# Patient Record
Sex: Female | Born: 1985 | Race: White | Hispanic: No | Marital: Married | Smoking: Never smoker
Health system: Southern US, Community
[De-identification: ages and names within clinical notes are randomized; demographics above are authoritative.]

## PROBLEM LIST (undated history)

## (undated) ENCOUNTER — Inpatient Hospital Stay (HOSPITAL_COMMUNITY): Payer: Self-pay

## (undated) DIAGNOSIS — Z91018 Allergy to other foods: Secondary | ICD-10-CM

## (undated) DIAGNOSIS — Z973 Presence of spectacles and contact lenses: Secondary | ICD-10-CM

## (undated) DIAGNOSIS — T4145XA Adverse effect of unspecified anesthetic, initial encounter: Secondary | ICD-10-CM

## (undated) DIAGNOSIS — O139 Gestational [pregnancy-induced] hypertension without significant proteinuria, unspecified trimester: Secondary | ICD-10-CM

## (undated) DIAGNOSIS — O09299 Supervision of pregnancy with other poor reproductive or obstetric history, unspecified trimester: Secondary | ICD-10-CM

## (undated) DIAGNOSIS — Z6740 Type O blood, Rh positive: Secondary | ICD-10-CM

## (undated) DIAGNOSIS — F329 Major depressive disorder, single episode, unspecified: Secondary | ICD-10-CM

## (undated) DIAGNOSIS — E0500 Thyrotoxicosis with diffuse goiter without thyrotoxic crisis or storm: Secondary | ICD-10-CM

## (undated) DIAGNOSIS — E039 Hypothyroidism, unspecified: Secondary | ICD-10-CM

## (undated) DIAGNOSIS — Z8619 Personal history of other infectious and parasitic diseases: Secondary | ICD-10-CM

## (undated) DIAGNOSIS — K9041 Non-celiac gluten sensitivity: Secondary | ICD-10-CM

## (undated) DIAGNOSIS — F419 Anxiety disorder, unspecified: Secondary | ICD-10-CM

## (undated) DIAGNOSIS — F32A Depression, unspecified: Secondary | ICD-10-CM

## (undated) DIAGNOSIS — R000 Tachycardia, unspecified: Secondary | ICD-10-CM

## (undated) DIAGNOSIS — T8859XA Other complications of anesthesia, initial encounter: Secondary | ICD-10-CM

## (undated) HISTORY — DX: Anxiety disorder, unspecified: F41.9

## (undated) HISTORY — DX: Depression, unspecified: F32.A

## (undated) HISTORY — DX: Personal history of other infectious and parasitic diseases: Z86.19

## (undated) HISTORY — DX: Type O blood, Rh positive: Z67.40

---
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
## 2007-02-09 ENCOUNTER — Emergency Department (HOSPITAL_COMMUNITY): Admission: EM | Admit: 2007-02-09 | Discharge: 2007-02-09 | Payer: Self-pay | Admitting: Emergency Medicine

## 2011-01-19 ENCOUNTER — Inpatient Hospital Stay (HOSPITAL_COMMUNITY)
Admission: AD | Admit: 2011-01-19 | Discharge: 2011-01-19 | Disposition: A | Discharge status: Home / Self Care | Payer: 59 | Source: Ambulatory Visit | Attending: Obstetrics and Gynecology | Admitting: Obstetrics and Gynecology

## 2011-01-19 DIAGNOSIS — O200 Threatened abortion: Secondary | ICD-10-CM | POA: Insufficient documentation

## 2011-01-19 LAB — HCG, QUANTITATIVE, PREGNANCY: hCG, Beta Chain, Quant, S: 331 m[IU]/mL — ABNORMAL HIGH (ref <5)

## 2011-01-20 LAB — ABO/RH: ABO/RH(D): O POS

## 2011-02-05 NOTE — Consult Note (Signed)
  NAMEJENIAH, Donna Tucker               ACCOUNT NO.:  [id number]  MEDICAL RECORD NO.:  [id number]           PATIENT TYPE:  I  LOCATION:  [facility]                         FACILITY:  [facility]  PHYSICIAN:  Lenoard Aden, M.D.DATE OF BIRTH:  1986-11-20  DATE OF CONSULTATION:  01/19/2011 DATE OF DISCHARGE:  01/19/2011                                CONSULTATION   CHIEF COMPLAINT:  Bleeding.  HISTORY OF PRESENT ILLNESS:  She is a 25 year old white female G2, P0 with positive pregnancy test on January 18, 2011, who presents now with sudden onset of bleeding this evening, which has now ceased.  She denies fever, chills, nausea, vomiting.  She has allergies to ASPIRIN and NSAIDs.  Family history of diabetes, chronic hypertension, colon, liver, and breast cancer.  She has a previous history of one abortion, uncomplicated.  No surgical hospitalizations.  No medical hospitalizations.  MEDICATIONS:  Multivitamin and fish oil.  PHYSICAL EXAMINATION:  GENERAL:  She is a well-developed, well-nourished white female. VITAL SIGNS:  Weight of 195 pounds, height of 5 feet 4 inches, blood pressure is 140/80. HEENT:  Normal. NECK:  Supple.  Full range of motion. LUNGS:  Clear. HEART:  Regular rate and rhythm. ABDOMEN:  Soft, nontender.  No CVA tenderness. EXTREMITIES:  There are no cords. NEUROLOGIC:  Nonfocal. SKIN:  Intact. PELVIC:  Deferred with no bleeding at this time.  She has group and Rh, quantitative hCG pending.  IMPRESSION:  Threatened abortion at 5 weeks.  PLAN:  Check quantitative hCG, group and Rh.  Triage pending results.     Lenoard Aden, M.D.     RJT/MEDQ  D:  01/19/2011  T:  01/20/2011  Job:  [id number]  Electronically Signed by Olivia Mackie M.D. on 02/05/2011 11:07:10 AM

## 2011-06-09 ENCOUNTER — Other Ambulatory Visit (HOSPITAL_COMMUNITY): Payer: Self-pay | Admitting: Endocrinology

## 2011-06-09 DIAGNOSIS — E0590 Thyrotoxicosis, unspecified without thyrotoxic crisis or storm: Secondary | ICD-10-CM

## 2011-06-10 ENCOUNTER — Ambulatory Visit: Payer: 59 | Admitting: *Deleted

## 2011-06-29 ENCOUNTER — Encounter (HOSPITAL_COMMUNITY)
Admission: RE | Admit: 2011-06-29 | Discharge: 2011-06-29 | Disposition: A | Discharge status: Home / Self Care | Payer: 59 | Source: Ambulatory Visit | Attending: Endocrinology | Admitting: Endocrinology

## 2011-06-29 ENCOUNTER — Ambulatory Visit: Payer: 59 | Admitting: *Deleted

## 2011-06-29 ENCOUNTER — Encounter: Payer: Self-pay | Admitting: *Deleted

## 2011-06-29 ENCOUNTER — Encounter: Payer: 59 | Attending: Obstetrics and Gynecology | Admitting: *Deleted

## 2011-06-29 DIAGNOSIS — E0590 Thyrotoxicosis, unspecified without thyrotoxic crisis or storm: Secondary | ICD-10-CM | POA: Insufficient documentation

## 2011-06-29 DIAGNOSIS — Z713 Dietary counseling and surveillance: Secondary | ICD-10-CM | POA: Insufficient documentation

## 2011-06-29 DIAGNOSIS — E669 Obesity, unspecified: Secondary | ICD-10-CM | POA: Insufficient documentation

## 2011-06-29 NOTE — Patient Instructions (Addendum)
Goals:  1300-1400 calories per day with 45 grams of carbs at meals and 1-2 snacks of 15 grams each.   Add protein to all meals and snacks.    Choose more whole grains, lean protein, low-fat dairy, and fruits/non-starchy vegetables.  Aim for 20 min of moderate physical activity 3 days per week.  Limit sugar-sweetened beverages and concentrated sweets.  Aim for 25 grams of fiber daily.  Follow up with me in 4 weeks.

## 2011-06-29 NOTE — Progress Notes (Signed)
  Medical Nutrition Therapy:  Appt start time: 1500 end time:  1600.  Assessment:  Primary concerns today: Obesity. Pt here for nutritional counseling and weight loss to increase success of future pregnancies.  States her goal weight is 140-150 lbs in one year.  Also reports recent dx of hyperthyroidism, which is being treated with propranolol (started 06/28/11).  Pt states her diet varies with "good days and bad days", where bad days include excessive CHO intake.  Eats out ~6x week including 3x/wk at hospital cafeteria.  Increased CHO intake noted at home as well and excessive thirst, from which pt averages 100-125 oz of water daily. States glucose is WNL.  MEDICATIONS: Propranolol   DIETARY INTAKE:  Usual eating pattern includes 3 meals and 1-2 snacks per day.  24-hr recall:  B ( AM): 2 eggs, banana, water or OJ (8 oz)  Snk ( AM): none  L ( PM): Fruit, Chobani flavored yogurt, salad or PB&J OR [facility] cafeteria (2 veggies, 1 starchy veggie, salad w/ vinagrette or FF ranch - 2 oz) plus H2O or crystal light Snk ( PM): yogurt from lunch (if leftover) D ( PM): Curry and lg portion rice OR veggie chili w/ beans and tomatoes Snk ( PM): popcorn or frozen raspberries (sometimes)  Usual physical activity: Was gym 3x/week @ 1 hr until 2 mos ago - stopped d/t hyperthyroid issues  Estimated energy needs: 1300-1400 calories 160-175 g carbohydrates 80-85 g protein 35-40 g fat 25 g fiber  Progress Towards Goal(s):  NEW   Nutritional Diagnosis:  -3.3 Obesity related to excessive CHO intake as evidenced by patient-reported food intake pattern and a BMI of 34.6 kg/m2..    Intervention/Goals:  1300-1400 calories per day with 45 grams of carbs at meals and 1-2 snacks of 15 grams each.   Add protein to all meals and snacks.    Choose more whole grains, lean protein, low-fat dairy, and fruits/non-starchy vegetables.  Aim for 20 min of moderate physical activity 3 days per week.  Limit  sugar-sweetened beverages and concentrated sweets.  Aim for 25 grams of fiber daily.  Follow up with me in 4 weeks.  Monitoring/Evaluation:  Dietary intake, exercise, and body weight in 4 week(s).

## 2011-06-30 ENCOUNTER — Encounter (HOSPITAL_COMMUNITY)
Admission: RE | Admit: 2011-06-30 | Discharge: 2011-06-30 | Disposition: A | Discharge status: Home / Self Care | Payer: 59 | Source: Ambulatory Visit | Attending: Endocrinology | Admitting: Endocrinology

## 2011-06-30 ENCOUNTER — Encounter (HOSPITAL_COMMUNITY): Payer: Self-pay

## 2011-06-30 MED ORDER — SODIUM PERTECHNETATE TC 99M INJECTION
10.7000 | Freq: Once | INTRAVENOUS | Status: AC | PRN
  Administered 2011-06-30: 10.7 via INTRAVENOUS

## 2011-06-30 MED ORDER — SODIUM IODIDE I 131 CAPSULE: 9.7000 | Freq: Once | INTRAVENOUS | Status: AC | PRN

## 2011-07-05 ENCOUNTER — Other Ambulatory Visit (HOSPITAL_COMMUNITY): Payer: Self-pay | Admitting: Endocrinology

## 2011-07-05 DIAGNOSIS — E0590 Thyrotoxicosis, unspecified without thyrotoxic crisis or storm: Secondary | ICD-10-CM

## 2011-07-08 ENCOUNTER — Other Ambulatory Visit (HOSPITAL_COMMUNITY): Payer: 59

## 2011-07-08 ENCOUNTER — Ambulatory Visit (HOSPITAL_COMMUNITY)
Admission: RE | Admit: 2011-07-08 | Discharge: 2011-07-08 | Disposition: A | Discharge status: Home / Self Care | Payer: 59 | Source: Ambulatory Visit | Attending: Endocrinology | Admitting: Endocrinology

## 2011-07-08 DIAGNOSIS — E0590 Thyrotoxicosis, unspecified without thyrotoxic crisis or storm: Secondary | ICD-10-CM | POA: Insufficient documentation

## 2011-07-26 ENCOUNTER — Encounter: Payer: Self-pay | Admitting: *Deleted

## 2011-07-26 ENCOUNTER — Encounter: Payer: 59 | Attending: Obstetrics and Gynecology | Admitting: *Deleted

## 2011-07-26 DIAGNOSIS — Z713 Dietary counseling and surveillance: Secondary | ICD-10-CM | POA: Insufficient documentation

## 2011-07-26 DIAGNOSIS — E669 Obesity, unspecified: Secondary | ICD-10-CM | POA: Insufficient documentation

## 2011-07-26 NOTE — Patient Instructions (Addendum)
Goals:  Continue previous nutrition goals.  Look for whole grain cereals with increased fiber (25-30g/day) - Try Cheerios (1 cup) with Soy milk (1/2 cup).  Check out USDA's Nutrient Database for increased fiber foods.  Increase exercise to 30-45 minutes, 3-4 times a week.

## 2011-07-26 NOTE — Progress Notes (Signed)
  Medical Nutrition Therapy:  Appt start time: 0800 end time:  0830.  Primary concerns today: Obesity - Follow up.  Reported no thyroid cancer dx, though decreased appetite and PO intake since starting thyroid meds.  Reports being extremely thirsty and consumes ~200 oz daily.    MEDICATIONS: No changes.  DIETARY INTAKE:  Past week's avg: 3 meals and 1 snack/day.  24-hr recall: water intake = 200 oz/day  B: 2 eggs, 1-2 pc [place] bacon; water, coffee Snk: Chobani fruit yogurt; water  L: Squash (sauteed in [place]), green beans (canned by aunt), 1/2 corn on cobb, 1 pc cornbread (2" cube), 1 small red potato; water    Snk: None D: Peanut butter (2 T) sandwich on wheat (40g CHO), water    Snk: None  Recent physical activity: Walking treadmill 20 min ~2-3x/week - was hard to find time d/t school. More time for next month b/c class ended.   Estimated energy needs: (NO CHANGES) 1300-1400 calories  160-175 g carbohydrates  80-85 g protein  35-40 g fat  25 g fiber  Progress Towards Goal(s):  In progress.   Nutritional Diagnosis:  [place]-3.3 Obesity related to excessive CHO intake as evidenced by patient-reported food intake pattern and a BMI of 34.6 kg/m2.  Intervention/Goals:  Continue previous nutrition goals.  Look for whole grain cereals with increased fiber (25-30g/day) - Try Cheerios (1 cup) with Soy milk (1/2 cup).  Refer to USDA's Nutrient Database for increased fiber foods.  Increase exercise to 30-45 minutes, 3-4 times a week.  Monitoring/Evaluation:  Dietary intake, exercise, and body weight in 4 week(s).

## 2011-07-27 ENCOUNTER — Encounter: Payer: Self-pay | Admitting: *Deleted

## 2011-08-23 ENCOUNTER — Encounter: Payer: 59 | Attending: Obstetrics and Gynecology | Admitting: *Deleted

## 2011-08-23 ENCOUNTER — Encounter: Payer: Self-pay | Admitting: *Deleted

## 2011-08-23 DIAGNOSIS — Z713 Dietary counseling and surveillance: Secondary | ICD-10-CM | POA: Insufficient documentation

## 2011-08-23 DIAGNOSIS — E669 Obesity, unspecified: Secondary | ICD-10-CM | POA: Insufficient documentation

## 2011-08-23 NOTE — Patient Instructions (Addendum)
Goals:  Continue previous nutrition goals.  Make sure to add a snack with carbs and protein before working out.   Limit simple sugars and aim for increased fiber foods.  Continue exercise regimen 45 minutes, 3-4 times a week.  Call if continue to gain weight after the next 2-3 weeks.

## 2011-08-23 NOTE — Progress Notes (Addendum)
  Medical Nutrition Therapy:  Appt start time: 0830 end time:  0900.  Primary concerns today: Obesity - Follow up.  Reported no thyroid cancer dx, though decreased appetite and PO intake since starting thyroid meds.  Reports being extremely thirsty and consumes ~200 oz daily.  Pt frustrated with weight gain, though per MD, she may gain 5 more lbs before thyroid med "evens out".   MEDICATIONS: Lotemax  DIETARY INTAKE:  Past week's avg: 3 meals and 0-1 snack/day.  24-hr recall:  Fluid intake = ~200 oz/day B:  2 eggs, 1 c MG Cheerios w/ 1/4 c soy milk; water, coffee Snk: none  L:  Soup w/ cabbage, veg broth, chicken, carrots, beets, and potatoes (2 c) and flavored greek yogurt (4 oz); water, hot tea (plain)  Snk: carrots (if no gym) OR carrots w/ peanut butter or crackers/peanut butter (if gym) D: 1 slice homemade veggie pizza, salad w/ sun dried tomato vinagrette (dipped); water    Snk: 1 beer (not typical); tomato and mozzarella salad (few bites)  Recent physical activity:  Treadmill or elliptical 45 min ~2-3x/week. Spin class tonight; adding group classes with friend.  Estimated energy needs: (NO CHANGES) 1300-1400 calories  160-175 g carbohydrates  80-85 g protein  35-40 g fat  25 g fiber  Progress Towards Goal(s):  In progress.   Nutritional Diagnosis:  -3.3 Obesity related to excessive CHO intake as evidenced by patient-reported food intake pattern and a BMI of 34.6 kg/m2.  Intervention/Goals:  Continue previous nutrition goals.  Make sure to add a snack with carbs and protein before working out.   Limit simple sugars and aim for increased fiber foods.  Continue exercise regimen 45 minutes, 3-4 times a week.  Call if continue to gain weight after the next 2-3 weeks.   Monitoring/Evaluation:  Dietary intake, exercise, and body weight in 6 week(s).

## 2011-10-04 ENCOUNTER — Encounter: Payer: Self-pay | Admitting: *Deleted

## 2011-10-04 ENCOUNTER — Encounter: Payer: 59 | Attending: Obstetrics and Gynecology | Admitting: *Deleted

## 2011-10-04 DIAGNOSIS — Z713 Dietary counseling and surveillance: Secondary | ICD-10-CM | POA: Insufficient documentation

## 2011-10-04 DIAGNOSIS — E669 Obesity, unspecified: Secondary | ICD-10-CM | POA: Insufficient documentation

## 2011-10-04 NOTE — Patient Instructions (Addendum)
Goals:  Continue previous nutrition goals - return to previous ratios of carbs and protein. I will send you a revised yellow card.  Email me your food record.   Continue increased exercise.

## 2011-10-04 NOTE — Progress Notes (Signed)
  Medical Nutrition Therapy:  Appt start time: 0800 end time:  0830.  Primary concerns today: Obesity - Follow up.  Reports eating more CHO, less protein, and 100 more calories daily. Using [employer] app on phone; recent avg of 1300-1450 cal/d. Continues fluid intake of ~200 oz/day and has been eating out more d/t time management issues and increased stress. Has increased exercise intensity and frequency since last visit.   MEDICATIONS: No changes  DIETARY INTAKE:  Past week's avg: 3 meals and 0-1 snack/day.  24-hr recall:   B: oatmeal w/ apple or banana; coffee, water Snk: none  L:  Eating out in hospital cafe more - more high sodium foods  Snk: carrots (if no gym) OR carrots w/ peanut butter or crackers/peanut butter (if gym) D: Eating out 2x/wk - salad from fast food    Snk: not usually  Recent physical activity:  Treadmill or elliptical; hike - 45 min ~3-4x/week w/ increased intensity. Spin class 1x/wk  Estimated energy needs: (NO CHANGES) 1300-1400 calories  160-175 g carbohydrates  80-85 g protein  35-40 g fat  25 g fiber  Progress Towards Goal(s):  In progress.   Nutritional Diagnosis:  [place]-3.3 Obesity related to excessive CHO intake as evidenced by patient-reported food intake pattern and a BMI of 34.6 kg/m2.  Intervention/Goals:  Continue previous nutrition goals - return to previous ratios of carbs and protein. I will send you a revised yellow card.  Email me your food record.   Continue increased exercise.   Monitoring/Evaluation:  Dietary intake, exercise, and body weight in 4 week(s).

## 2011-11-02 ENCOUNTER — Ambulatory Visit: Payer: 59 | Admitting: *Deleted

## 2012-01-28 ENCOUNTER — Emergency Department
Admission: EM | Admit: 2012-01-28 | Discharge: 2012-01-28 | Disposition: A | Discharge status: Home / Self Care | Payer: 59 | Source: Home / Self Care | Attending: Family Medicine | Admitting: Family Medicine

## 2012-01-28 DIAGNOSIS — J069 Acute upper respiratory infection, unspecified: Secondary | ICD-10-CM

## 2012-01-28 MED ORDER — BENZONATATE 200 MG PO CAPS: 200.0000 mg | ORAL_CAPSULE | Freq: Every day | ORAL | Status: AC

## 2012-01-28 MED ORDER — AMOXICILLIN 875 MG PO TABS: 875.0000 mg | ORAL_TABLET | Freq: Two times a day (BID) | ORAL | Status: AC

## 2012-01-28 NOTE — ED Notes (Signed)
Recent travel to [place], came back with URI. Cough x4-5 nights and sore throat x 1 week.

## 2012-01-28 NOTE — ED Provider Notes (Signed)
History     CSN: [id number]  Arrival date & time 01/28/12  1713   First MD Initiated Contact with Patient 01/28/12 1747      Chief Complaint  Patient presents with  . Sore Throat     HPI Comments: Patient states that she was in [place] about one month ago and developed a mild URI with scratchy throat and sinus congestion that only lasted about a week. Since [place] complains of approximately 5 day history of gradually progressive URI symptoms beginning with a sore throat (now improved), followed by progressive nasal congestion.  A cough started about 3 days ago.  Complains of fatigue but no myalgias.  Cough is now worse at night and generally non-productive during the day.  There has been no pleuritic pain, shortness of breath, or wheezes.  She has had no fevers, chills, and sweats   The history is provided by the patient.    Past Medical History  Diagnosis Date  . Thyroid disease   . Obesity (BMI 30-39.9)     History reviewed. No pertinent past surgical history.  Family History  Problem Relation Age of Onset  . Diabetes Mother     T1DM    History  Substance Use Topics  . Smoking status: Never Smoker   . Smokeless tobacco: Never Used  . Alcohol Use: 0.6 oz/week    1 Cans of beer per week    OB History    Grav Para Term Preterm Abortions TAB SAB Ect Mult Living                  Review of Systems + sore throat, now improved + cough, worse at night No pleuritic pain No wheezing + nasal congestion ? post-nasal drainage No sinus pain/pressure No itchy/red eyes No earache, but ears feel clogged No hemoptysis No SOB No fever/chills No nausea No vomiting No abdominal pain No diarrhea No urinary symptoms No skin rashes + fatigue No myalgias + headache Used OTC meds without relief  Allergies  Nsaids; Salicylates; and Lactose intolerance (gi)  Home Medications   Current Outpatient Rx  Name Route Sig Dispense Refill  . CLOMIPHENE  CITRATE 50 MG PO TABS Oral Take 50 mg by mouth daily.    . CYCLOSPORINE 0.05 % OP EMUL  1 drop 2 (two) times daily.    . AMOXICILLIN 875 MG PO TABS Oral Take 1 tablet (875 mg total) by mouth 2 (two) times daily. (Rx void after 02/05/12) 20 tablet 0  . BENZONATATE 200 MG PO CAPS Oral Take 1 capsule (200 mg total) by mouth at bedtime. Take as needed for cough 12 capsule 0  . LOTEPREDNOL ETABONATE 0.5 % OP SUSP Both Eyes Place 1 drop into both eyes 2 (two) times daily.      Marland Kitchen PROPRANOLOL HCL 80 MG PO TABS Oral Take 80 mg by mouth daily.      Marland Kitchen PROPYLTHIOURACIL 50 MG PO TABS Oral Take by mouth at bedtime.        BP 124/88  Pulse 72  Temp(Src) 98.2 F (36.8 C) (Oral)  Resp 16  Ht 5\' 4"  (1.626 m)  Wt 223 lb 8 oz (101.379 kg)  BMI 38.36 kg/m2  SpO2 98%  LMP 01/17/2012  Physical Exam Nursing notes and Vital Signs reviewed. Appearance:  Patient appears healthy, stated age, and in no acute distress Eyes:  Pupils are equal, round, and reactive to light and accomodation.  Extraocular movement is intact.  Conjunctivae are not inflamed  Ears:  Canals normal.  Tympanic membranes normal.  Nose:  Mildly congested turbinates.  No sinus tenderness.   Pharynx:  Normal Neck:  Supple.  Slightly tender shotty posterior nodes are palpated bilaterally  Lungs:  Clear to auscultation.  Breath sounds are equal.  Heart:  Regular rate and rhythm without murmurs, rubs, or gallops.  Abdomen:  Nontender without masses or hepatosplenomegaly.  Bowel sounds are present.  No CVA or flank tenderness.  Skin:  No rash present.   ED Course  Procedures   none      1. Acute upper respiratory infections of unspecified site       MDM  There is no evidence of bacterial infection today.   Treat symptomatically for now: Take plain Mucinex (guaifenesin) twice daily for cough and congestion.  Increase fluid intake, rest. May use Afrin nasal spray (or generic oxymetazoline) twice daily for about 5 days.  Also recommend  using saline nasal spray several times daily and saline nasal irrigation (AYR is a common brand) Stop all antihistamines for now, and other non-prescription cough/cold preparations. Begin Amoxicillin if not improving about 5 days or if persistent fever develops (Given a prescription to hold, with an expiration date)  Follow-up with family doctor if not improving 7 to 10 days.         Donna Christen, MD 01/28/12 [phone]

## 2012-08-04 ENCOUNTER — Encounter: Payer: Self-pay | Admitting: Emergency Medicine

## 2012-08-04 ENCOUNTER — Emergency Department
Admission: EM | Admit: 2012-08-04 | Discharge: 2012-08-04 | Disposition: A | Discharge status: Home / Self Care | Payer: 59 | Source: Home / Self Care

## 2012-08-04 ENCOUNTER — Emergency Department (INDEPENDENT_AMBULATORY_CARE_PROVIDER_SITE_OTHER): Payer: 59

## 2012-08-04 DIAGNOSIS — M25579 Pain in unspecified ankle and joints of unspecified foot: Secondary | ICD-10-CM

## 2012-08-04 DIAGNOSIS — S93409A Sprain of unspecified ligament of unspecified ankle, initial encounter: Secondary | ICD-10-CM

## 2012-08-04 DIAGNOSIS — M7989 Other specified soft tissue disorders: Secondary | ICD-10-CM

## 2012-08-04 DIAGNOSIS — S93402A Sprain of unspecified ligament of left ankle, initial encounter: Secondary | ICD-10-CM

## 2012-08-04 MED ORDER — HYDROCODONE-ACETAMINOPHEN 5-500 MG PO TABS: ORAL_TABLET | ORAL | Status: AC

## 2012-08-04 NOTE — ED Provider Notes (Signed)
History     CSN: [id number]  Arrival date & time 08/04/12  1013   None     Chief Complaint  Patient presents with  . Ankle Injury    Patient is a 26 y.o. female presenting with lower extremity injury. The history is provided by the patient.  Ankle Injury This is a new problem. The current episode started 1 to 2 hours ago (Accidentally tripped at home and fell down some steps landing on left ankle.). The problem occurs constantly (Pain). The problem has been gradually worsening. Pertinent negatives include no chest pain, no abdominal pain, no headaches and no shortness of breath. Associated symptoms comments: No other complaints other than left ankle pain and swelling. The symptoms are aggravated by bending and walking. The symptoms are relieved by rest (Minimally). She has tried nothing for the symptoms.   The main area of severe pain and swelling is left lateral ankle. Also, there is moderate pain with mild swelling medial ankle.--Pain exacerbated by movement of ankle in all 4 motions: inversion or eversion, flexion or extension.  Denies numbness or any foot symptoms. Past Medical History  Diagnosis Date  . Thyroid disease   . Obesity (BMI 30-39.9)     History reviewed. No pertinent past surgical history.  Family History  Problem Relation Age of Onset  . Diabetes Mother     T1DM    History  Substance Use Topics  . Smoking status: Never Smoker   . Smokeless tobacco: Never Used  . Alcohol Use: 0.6 oz/week    1 Cans of beer per week    OB History    Grav Para Term Preterm Abortions TAB SAB Ect Mult Living                  Review of Systems  Constitutional: Negative for fever.  HENT: Negative for neck pain.   Eyes: Negative.   Respiratory: Negative.  Negative for shortness of breath.   Cardiovascular: Negative.  Negative for chest pain.  Gastrointestinal: Negative.  Negative for abdominal pain.  Genitourinary: Negative.   Musculoskeletal: Negative for back pain.    Skin: Negative for rash and wound.  Neurological: Negative for syncope, weakness, numbness and headaches.  Hematological: Negative.   All other systems reviewed and are negative.    Allergies  Nsaids; Salicylates; and Lactose intolerance (gi)  Home Medications   Current Outpatient Rx  Name Route Sig Dispense Refill  . CLOMIPHENE CITRATE 50 MG PO TABS Oral Take 50 mg by mouth daily.    . CYCLOSPORINE 0.05 % OP EMUL  1 drop 2 (two) times daily.    Marland Kitchen HYDROCODONE-ACETAMINOPHEN 5-500 MG PO TABS  Take 1 or 2 every 4-6 hours as needed for severe pain 6 tablet 0  . LOTEPREDNOL ETABONATE 0.5 % OP SUSP Both Eyes Place 1 drop into both eyes 2 (two) times daily.      Marland Kitchen PROPRANOLOL HCL 80 MG PO TABS Oral Take 80 mg by mouth daily.      Marland Kitchen PROPYLTHIOURACIL 50 MG PO TABS Oral Take by mouth at bedtime.        BP 128/83  Pulse 86  Temp 98 F (36.7 C) (Oral)  Resp 16  Ht 5\' 5"  (1.651 m)  Wt 227 lb (102.967 kg)  BMI 37.77 kg/m2  SpO2 97%  LMP 07/20/2012  Physical Exam  Nursing note and vitals reviewed. Constitutional: She is oriented to person, place, and time. She appears well-developed and well-nourished. No distress.  HENT:  Head: Normocephalic and atraumatic.  Eyes: Conjunctivae and EOM are normal. Pupils are equal, round, and reactive to light. No scleral icterus.  Neck: Normal range of motion.  Cardiovascular: Normal rate.   Pulmonary/Chest: Effort normal.  Abdominal: She exhibits no distension.  Neurological: She is alert and oriented to person, place, and time.  Skin: Skin is warm.  Psychiatric: She has a normal mood and affect.   musculoskeletal: Left ankle: + Ankle Tenderness entire joint, Distal fibula 3+ tender and swelling, with mild ecchymosis. Medial malleolus +1 tender with minimal swelling,  Deltoid ligament NT , Lateral ligaments tender (especially anterior talofibular ligament), Achilles NT, Proximal fibula NT, Proximal 5th metatarsal NT, Midfoot NT, distal NVI with  baseline sensation / motor.  CR<2 seconds.  There are no open skin wounds .  Range of motion limited in all directions because of pain. No ligamentous instability. Negative anterior drawer sign. ----- No other musculoskeletal abnormalities noted. No signs of any other injury. Negative Homans sign. No calf tenderness.   ED Course  Procedures (including critical care time) 12:29 PM-x-ray left ankle ordered  Dg Ankle Complete Left  08/04/2012  *RADIOLOGY REPORT*  Clinical Data: Left ankle pain after injury.  LEFT ANKLE COMPLETE - 3+ VIEW  Comparison: None.  Findings: There is no evidence for fracture, subluxation or dislocation.  No worrisome lytic or sclerotic osseous lesion.  IMPRESSION: Normal ankle.   Original Report Authenticated By: ERIC A. MANSELL, M.D.      1. Sprain of ankle, left       MDM  Likely has grade 2 ankle sprain, especially left lateral ankle, anterior talofibular ligament. But also involving medial ligaments. X-ray left ankle negative. We discussed diagnosis and findings and treatment options. After risks, benefits, alternatives, she agrees with the following plans: Encourage rest, ice, compression with ACE bandage, and elevation of injured body part. ASO ankle brace applied left ankle. Avoid full weightbearing left ankle, at least until recheck by physician in one week. Crutches supplied to patient, and our nurse spent a great deal of time assisting patient with learning how to walk with crutches. Advised to make appointment for recheck with Dr. Benjamin Stain (sports medicine specialist) in one week, sooner when necessary.  See detailed Instructions in AVS, which were given to patient. Verbal instructions also given. Risks, benefits, and alternatives of treatment options discussed. Questions invited and answered. Patient voiced understanding and agreement with plans.          Lajean Manes, MD 08/04/12 1235

## 2012-08-04 NOTE — ED Notes (Signed)
Left ankle injury, fell off step this morning

## 2012-08-05 ENCOUNTER — Telehealth: Payer: Self-pay | Admitting: Family Medicine

## 2012-08-11 ENCOUNTER — Encounter: Payer: Self-pay | Admitting: Sports Medicine

## 2012-08-11 ENCOUNTER — Ambulatory Visit (INDEPENDENT_AMBULATORY_CARE_PROVIDER_SITE_OTHER): Payer: 59 | Admitting: Sports Medicine

## 2012-08-11 VITALS — BP 118/79 | HR 78 | Ht 65.0 in | Wt 228.0 lb

## 2012-08-11 DIAGNOSIS — S93409A Sprain of unspecified ligament of unspecified ankle, initial encounter: Secondary | ICD-10-CM

## 2012-08-11 NOTE — Progress Notes (Signed)
Patient ID: Donna Tucker, female   DOB: Dec 26, 1985, 26 y.o.   MRN: [id number] Subjective:    I'm seeing this patient as a consultation for:  Dr. Georgina Pillion  CC: Left ankle sprain  HPI: This is a very pleasant 26 year old female, who unfortunately had an inversion injury to her ankle approximately one week ago. She had immediate pain, immediate swelling, and immediate bruising localized over the lateral aspect. She went to urgent care, had x-rays that were done that were negative for fracture, and was placed in an ASO. She was given Tylenol for pain, as she is allergic to NSAIDs.  Overall she's approximately 50% better. She is able to bear weight now. Her pain is localized over the anterolateral ankle, over the ATFL.  Past medical history, Surgical history, Family history, Social history, Allergies, and medications have been entered into the medical record, reviewed, and no changes needed.   Review of Systems: No headache, visual changes, nausea, vomiting, diarrhea, constipation, dizziness, abdominal pain, skin rash, fevers, chills, night sweats, weight loss, body aches, joint swelling, muscle aches, chest pain, or shortness of breath.   Objective:   Vitals:  Afebrile, vital signs stable. General: Well Developed, well nourished, and in no acute distress.  Neuro: Alert and oriented x3, extra-ocular muscles intact.  Skin: Warm and dry, no rashes noted.  Respiratory: Not using accessory muscles, speaking in full sentences.  Cardiovascular: Pulses palpable, no extremity edema. Left  Ankle: There is still moderate swelling, as well as a small amount of bruising, dependent to her lateral malleolus. Range of motion is full in all directions. Strength is 4/5 to resisted eversion. Stable lateral and medial ligaments; squeeze test and kleiger test unremarkable; Mildly tender over the lateral talar dome, however I suspect this is just tenderness over the overlying anterior talofibular ligament. No pain  at base of 5th MT; No tenderness over cuboid; No tenderness over N spot or navicular prominence No tenderness on posterior aspects of lateral and medial malleolus No sign of peroneal tendon subluxations or tenderness to palpation Negative tarsal tunnel tinel's Able to walk 4 steps.  I did review her x-rays, they show no sign of fracture, dislocation, the ankle mortise is intact. The talar dome is unremarkable.  Impression and Recommendations:

## 2012-08-11 NOTE — Assessment & Plan Note (Signed)
I agree with the previous diagnosis of a grade 2 anterior talofibular ligament sprain. I placed Ace wrap for compression. She should continue the ASO. I've given her some ankle rehabilitation exercises. I would like to see her back in 2 weeks. If no better we can consider formal physical therapy before MRI.

## 2012-08-24 ENCOUNTER — Other Ambulatory Visit (HOSPITAL_COMMUNITY): Payer: Self-pay | Admitting: Obstetrics and Gynecology

## 2012-08-24 DIAGNOSIS — N979 Female infertility, unspecified: Secondary | ICD-10-CM

## 2012-08-25 ENCOUNTER — Encounter: Payer: Self-pay | Admitting: Sports Medicine

## 2012-08-25 ENCOUNTER — Ambulatory Visit (INDEPENDENT_AMBULATORY_CARE_PROVIDER_SITE_OTHER): Payer: 59 | Admitting: Sports Medicine

## 2012-08-25 VITALS — BP 128/84 | HR 90 | Temp 97.7°F | Wt 228.0 lb

## 2012-08-25 DIAGNOSIS — S93409A Sprain of unspecified ligament of unspecified ankle, initial encounter: Secondary | ICD-10-CM

## 2012-08-25 MED ORDER — EPINEPHRINE 0.3 MG/0.3ML IJ DEVI: 0.3000 mg | Freq: Once | INTRAMUSCULAR | Status: DC

## 2012-08-25 NOTE — Assessment & Plan Note (Signed)
Over 90% improved. Continue home exercises. She will wear her ankle stabilizing orthosis when on her feet. She may come back to see me if not completely better in 2 weeks, otherwise she is released.

## 2012-08-25 NOTE — Progress Notes (Signed)
Subjective:    CC: Followup ankle sprain  HPI: She returns approximately 80-90% better. She is very happy with the results.  Past medical history, Surgical history, Family history, Social history, Allergies, and medications have been entered into the medical record, reviewed, and no changes needed.   Review of Systems: No fevers, chills, night sweats, weight loss, chest pain, or shortness of breath.   Objective:    General: Well Developed, well nourished, and in no acute distress.  Neuro: Alert and oriented x3, extra-ocular muscles intact.  HEENT: Normocephalic, atraumatic, pupils equal round reactive to light, neck supple, no masses, no lymphadenopathy, thyroid nonpalpable.  Skin: Warm and dry, no rashes. Cardiac: Regular rate and rhythm, no murmurs rubs or gallops.  Respiratory: Clear to auscultation bilaterally. Not using accessory muscles, speaking in full sentences. Ankle: No visible erythema or swelling. Range of motion is full in all directions. Strength is 5/5 in all directions. Stable lateral and medial ligaments; squeeze test and kleiger test unremarkable; Talar dome nontender; No pain at base of 5th MT; No tenderness over cuboid; No tenderness over N spot or navicular prominence No tenderness on posterior aspects of lateral and medial malleolus No sign of peroneal tendon subluxations or tenderness to palpation Negative tarsal tunnel tinel's Able to walk 4 steps.  Impression and Recommendations:

## 2012-08-29 ENCOUNTER — Ambulatory Visit (HOSPITAL_COMMUNITY)
Admission: RE | Admit: 2012-08-29 | Discharge: 2012-08-29 | Disposition: A | Discharge status: Home / Self Care | Payer: 59 | Source: Ambulatory Visit | Attending: Obstetrics and Gynecology | Admitting: Obstetrics and Gynecology

## 2012-08-29 DIAGNOSIS — N979 Female infertility, unspecified: Secondary | ICD-10-CM

## 2012-08-29 MED ORDER — IOHEXOL 300 MG/ML  SOLN
5.0000 mL | Freq: Once | INTRAMUSCULAR | Status: AC | PRN
  Administered 2012-08-29: 5 mL

## 2013-01-23 ENCOUNTER — Ambulatory Visit (INDEPENDENT_AMBULATORY_CARE_PROVIDER_SITE_OTHER): Payer: 59 | Admitting: Sports Medicine

## 2013-01-23 ENCOUNTER — Encounter: Payer: Self-pay | Admitting: Sports Medicine

## 2013-01-23 VITALS — BP 142/96 | HR 87 | Temp 98.2°F | Wt 215.0 lb

## 2013-01-23 DIAGNOSIS — J0100 Acute maxillary sinusitis, unspecified: Secondary | ICD-10-CM | POA: Insufficient documentation

## 2013-01-23 DIAGNOSIS — J020 Streptococcal pharyngitis: Secondary | ICD-10-CM

## 2013-01-23 DIAGNOSIS — J069 Acute upper respiratory infection, unspecified: Secondary | ICD-10-CM

## 2013-01-23 LAB — POCT RAPID STREP A (OFFICE): Rapid Strep A Screen: NEGATIVE

## 2013-01-23 MED ORDER — HYDROCOD POLST-CHLORPHEN POLST 10-8 MG/5ML PO LQCR: 5.0000 mL | Freq: Two times a day (BID) | ORAL | Status: DC | PRN

## 2013-01-23 NOTE — Assessment & Plan Note (Signed)
Likely viral with a negative rapid strep test. Tussionex for nighttime symptoms. Return as needed, I did inform her that the symptoms can hang around for approximately 4 weeks.

## 2013-01-23 NOTE — Patient Instructions (Addendum)

## 2013-01-23 NOTE — Progress Notes (Signed)
Subjective:    CC: Sick  HPI: This is a very pleasant 60 her old female who comes in with a two-week history of mild scratchy throat, mild cough but is currently resolving, no constitutional symptoms, no rash, no GI symptoms. Symptoms are overall improving, but the itchy scratchy throat is more or less hanging around. She denies any sinus pain or pressure, denies any nasal discharge.  Past medical history, Surgical history, Family history not pertinant except as noted below, Social history, Allergies, and medications have been entered into the medical record, reviewed, and no changes needed.   Review of Systems: No fevers, chills, night sweats, weight loss, chest pain, or shortness of breath.   Objective:    General: Well Developed, well nourished, and in no acute distress.  Neuro: Alert and oriented x3, extra-ocular muscles intact, sensation grossly intact.  HEENT: Normocephalic, atraumatic, pupils equal round reactive to light, neck supple, no masses, no lymphadenopathy, thyroid nonpalpable. Tonsillitis is present without exudates, nasopharynx and external ear canals are unremarkable to inspection. Skin: Warm and dry, no rashes. Cardiac: Regular rate and rhythm, no murmurs rubs or gallops.  Respiratory: Clear to auscultation bilaterally. Not using accessory muscles, speaking in full sentences.  Impression and Recommendations:

## 2013-01-27 ENCOUNTER — Other Ambulatory Visit: Payer: Self-pay

## 2013-02-02 ENCOUNTER — Ambulatory Visit (INDEPENDENT_AMBULATORY_CARE_PROVIDER_SITE_OTHER): Payer: 59 | Admitting: Sports Medicine

## 2013-02-02 ENCOUNTER — Telehealth: Payer: Self-pay

## 2013-02-02 ENCOUNTER — Encounter: Payer: Self-pay | Admitting: Sports Medicine

## 2013-02-02 VITALS — BP 142/95 | HR 98 | Temp 97.9°F | Wt 209.0 lb

## 2013-02-02 DIAGNOSIS — J0100 Acute maxillary sinusitis, unspecified: Secondary | ICD-10-CM

## 2013-02-02 MED ORDER — AMOXICILLIN-POT CLAVULANATE 875-125 MG PO TABS: 1.0000 | ORAL_TABLET | Freq: Two times a day (BID) | ORAL | Status: DC

## 2013-02-02 MED ORDER — PREDNISONE 50 MG PO TABS: 50.0000 mg | ORAL_TABLET | Freq: Every day | ORAL | Status: DC

## 2013-02-02 MED ORDER — FLUTICASONE PROPIONATE 50 MCG/ACT NA SUSP: NASAL | Status: DC

## 2013-02-02 NOTE — Progress Notes (Signed)
Subjective:    CC: Sick  HPI: Donna Tucker comes in with a couple weeks history of pain and pressure over her maxillary sinuses radiating to her ears. She also has a mild sore throat. Symptoms are moderate, and persistent. She tried some over-the-counter nasal sprays without any improvement. Denies cough, wheeze, shortness of breath, fevers, chills, GI symptoms, or rash.  Past medical history, Surgical history, Family history not pertinant except as noted below, Social history, Allergies, and medications have been entered into the medical record, reviewed, and no changes needed.   Review of Systems: No fevers, chills, night sweats, weight loss, chest pain, or shortness of breath.   Objective:    General: Well Developed, well nourished, and in no acute distress.  Neuro: Alert and oriented x3, extra-ocular muscles intact, sensation grossly intact.  HEENT: Normocephalic, atraumatic, pupils equal round reactive to light, neck supple, no masses, no lymphadenopathy, thyroid nonpalpable. Tender to palpation over the maxillary sinuses, nasopharynx and extremity of canals are unremarkable, there is tonsillar hypertrophy without exudate. Skin: Warm and dry, no rashes. Cardiac: Regular rate and rhythm, no murmurs rubs or gallops.  Respiratory: Clear to auscultation bilaterally. Not using accessory muscles, speaking in full sentences. Impression and Recommendations:

## 2013-02-02 NOTE — Assessment & Plan Note (Signed)
With eustachian tube dysfunction.  Prednisone for 5 days, Augmentin, Flonase. Return as needed.

## 2013-02-02 NOTE — Telephone Encounter (Signed)
Scheduled patient to come in for an acute visit.

## 2013-07-18 ENCOUNTER — Encounter: Payer: Self-pay | Admitting: Sports Medicine

## 2013-08-14 ENCOUNTER — Ambulatory Visit (INDEPENDENT_AMBULATORY_CARE_PROVIDER_SITE_OTHER): Payer: 59 | Admitting: Sports Medicine

## 2013-08-14 ENCOUNTER — Encounter: Payer: Self-pay | Admitting: Sports Medicine

## 2013-08-14 VITALS — BP 130/87 | HR 79 | Wt 191.0 lb

## 2013-08-14 DIAGNOSIS — E0500 Thyrotoxicosis with diffuse goiter without thyrotoxic crisis or storm: Secondary | ICD-10-CM

## 2013-08-14 DIAGNOSIS — Z299 Encounter for prophylactic measures, unspecified: Secondary | ICD-10-CM | POA: Insufficient documentation

## 2013-08-14 DIAGNOSIS — E669 Obesity, unspecified: Secondary | ICD-10-CM

## 2013-08-14 DIAGNOSIS — Z0000 Encounter for general adult medical examination without abnormal findings: Secondary | ICD-10-CM

## 2013-08-14 HISTORY — DX: Obesity, unspecified: E66.9

## 2013-08-14 MED ORDER — METHIMAZOLE 10 MG PO TABS: 10.0000 mg | ORAL_TABLET | Freq: Every day | ORAL | Status: DC

## 2013-08-14 MED ORDER — PHENTERMINE HCL 37.5 MG PO TABS: 37.5000 mg | ORAL_TABLET | Freq: Every day | ORAL | Status: DC

## 2013-08-14 NOTE — Assessment & Plan Note (Signed)
Phentermine, she is already following a good diet, already doing an exercise prescription. Return monthly for weight checks and refills.

## 2013-08-14 NOTE — Assessment & Plan Note (Signed)
Sees Dr. Juleen China. Checking TSH, T3, T4. No symptoms of hyperthyroidism. Refilling methimazole, she will keep a close followup with her endocrinologist.

## 2013-08-14 NOTE — Assessment & Plan Note (Signed)
Complete physical performed. Checking routine blood work.

## 2013-08-14 NOTE — Progress Notes (Signed)
  Subjective:    CC: Complete physical  HPI:  Physical exam: Donna Tucker is up-to-date on Pap smear, she will be getting a flu shot later in the year.  Graves' disease: Has an endocrinologist who sees her occasionally, she is on methimazole on a daily basis. She needs blood work checked. No palpitations, asymptomatic.  Obesity: Present for years, she has lost 30-40 pounds, she desires pharmacologic intervention. She already eats a low carbohydrate diet, low calorie. She also exercises between 30 and 40 minutes 4 times a week and gets her heart rate up to 80-85% of the maximum. She does desire to try pharmacologic intervention.  Past medical history, Surgical history, Family history not pertinant except as noted below, Social history, Allergies, and medications have been entered into the medical record, reviewed, and no changes needed.   Review of Systems: No headache, visual changes, nausea, vomiting, diarrhea, constipation, dizziness, abdominal pain, skin rash, fevers, chills, night sweats, swollen lymph nodes, weight loss, chest pain, body aches, joint swelling, muscle aches, shortness of breath, mood changes, visual or auditory hallucinations.  Objective:    General: Well Developed, well nourished, and in no acute distress.  Neuro: Alert and oriented x3, extra-ocular muscles intact, sensation grossly intact.  HEENT: Normocephalic, atraumatic, pupils equal round reactive to light, neck supple, no masses, no lymphadenopathy, thyroid nonpalpable.  Skin: Warm and dry, no rashes noted.  Cardiac: Regular rate and rhythm, no murmurs rubs or gallops.  Respiratory: Clear to auscultation bilaterally. Not using accessory muscles, speaking in full sentences.  Abdominal: Soft, nontender, nondistended, positive bowel sounds, no masses, no organomegaly.  Musculoskeletal: Shoulder, elbow, wrist, hip, knee, ankle stable, and with full range of motion.  Impression and Recommendations:    The patient was  counselled, risk factors were discussed, anticipatory guidance given.

## 2013-08-16 LAB — CHLORIDE
Alkaline Phosphatase: 75 U/L
Calcium: 9.6 mg/dL
Free Thyroxine Index: 2.3
Protein: 7.1
Thyroxine (T4): 9

## 2013-08-16 LAB — BASIC METABOLIC PANEL
BUN: 14 mg/dL (ref 4–21)
Creatinine: 0.7 mg/dL (ref 0.5–1.1)
Glucose: 73 mg/dL
Potassium: 4.2 mmol/L (ref 3.4–5.3)
Sodium: 140 mmol/L (ref 137–147)

## 2013-08-16 LAB — CBC AND DIFFERENTIAL: WBC: 9 10^3/mL

## 2013-09-11 ENCOUNTER — Ambulatory Visit (INDEPENDENT_AMBULATORY_CARE_PROVIDER_SITE_OTHER): Payer: 59 | Admitting: Sports Medicine

## 2013-09-11 ENCOUNTER — Encounter: Payer: Self-pay | Admitting: Sports Medicine

## 2013-09-11 VITALS — BP 134/91 | HR 97 | Wt 177.0 lb

## 2013-09-11 DIAGNOSIS — E669 Obesity, unspecified: Secondary | ICD-10-CM

## 2013-09-11 DIAGNOSIS — Z299 Encounter for prophylactic measures, unspecified: Secondary | ICD-10-CM

## 2013-09-11 NOTE — Assessment & Plan Note (Signed)
Discussed options. Sprintec as above.

## 2013-09-11 NOTE — Assessment & Plan Note (Signed)
14 pound weight loss since the last visit. Refilling phentermine. Return in one week for weight check and refills.

## 2013-09-11 NOTE — Progress Notes (Signed)
  Subjective:    CC: Weight check  HPI: Obesity: 191 pounds at the last visit, 177 today after starting phentermine. Very happy with results.  Hyperthyroidism: Has close followup with her endocrinologist, continues methimazole.  Family planning: Desires to start trying once she is lost some weight, she's already tried rhythm method, clomiphene, artificial insemination, nothing has worked. She does desire birth control until approximately 6 months from now.  Past medical history, Surgical history, Family history not pertinant except as noted below, Social history, Allergies, and medications have been entered into the medical record, reviewed, and no changes needed.   Review of Systems: No fevers, chills, night sweats, weight loss, chest pain, or shortness of breath.   Objective:    General: Well Developed, well nourished, and in no acute distress.  Neuro: Alert and oriented x3, extra-ocular muscles intact, sensation grossly intact.  HEENT: Normocephalic, atraumatic, pupils equal round reactive to light, neck supple, no masses, no lymphadenopathy, thyroid nonpalpable.  Skin: Warm and dry, no rashes. Cardiac: Regular rate and rhythm, no murmurs rubs or gallops, no lower extremity edema.  Respiratory: Clear to auscultation bilaterally. Not using accessory muscles, speaking in full sentences.  Impression and Recommendations:

## 2013-09-13 ENCOUNTER — Encounter: Payer: Self-pay | Admitting: *Deleted

## 2013-10-18 ENCOUNTER — Encounter: Payer: Self-pay | Admitting: Sports Medicine

## 2013-10-18 ENCOUNTER — Ambulatory Visit (INDEPENDENT_AMBULATORY_CARE_PROVIDER_SITE_OTHER): Payer: 59 | Admitting: Sports Medicine

## 2013-10-18 VITALS — BP 135/96 | HR 106 | Wt 166.0 lb

## 2013-10-18 DIAGNOSIS — E669 Obesity, unspecified: Secondary | ICD-10-CM

## 2013-10-18 MED ORDER — PHENTERMINE HCL 37.5 MG PO TABS: 37.5000 mg | ORAL_TABLET | Freq: Every day | ORAL | Status: DC

## 2013-10-18 NOTE — Assessment & Plan Note (Signed)
Additional 11 pound weight loss since last month with phentermine. Refilling. Return in one month.

## 2013-10-18 NOTE — Progress Notes (Signed)
  Subjective:    CC: Followup  HPI: Obesity: Additional 11 pounds weight loss in the last month, no adverse effects, she does note abnormal food aversions, but is still happy with her weight loss. She continues to exercise.  Past medical history, Surgical history, Family history not pertinant except as noted below, Social history, Allergies, and medications have been entered into the medical record, reviewed, and no changes needed.   Review of Systems: No fevers, chills, night sweats, weight loss, chest pain, or shortness of breath.   Objective:    General: Well Developed, well nourished, and in no acute distress.  Neuro: Alert and oriented x3, extra-ocular muscles intact, sensation grossly intact.  HEENT: Normocephalic, atraumatic, pupils equal round reactive to light, neck supple, no masses, no lymphadenopathy, thyroid nonpalpable.  Skin: Warm and dry, no rashes. Cardiac: Regular rate and rhythm, no murmurs rubs or gallops, no lower extremity edema.  Respiratory: Clear to auscultation bilaterally. Not using accessory muscles, speaking in full sentences.  Impression and Recommendations:

## 2013-11-15 ENCOUNTER — Ambulatory Visit (INDEPENDENT_AMBULATORY_CARE_PROVIDER_SITE_OTHER): Payer: 59 | Admitting: Sports Medicine

## 2013-11-15 ENCOUNTER — Encounter: Payer: Self-pay | Admitting: Sports Medicine

## 2013-11-15 VITALS — BP 146/96 | HR 113 | Wt 165.0 lb

## 2013-11-15 DIAGNOSIS — J209 Acute bronchitis, unspecified: Secondary | ICD-10-CM | POA: Insufficient documentation

## 2013-11-15 DIAGNOSIS — E669 Obesity, unspecified: Secondary | ICD-10-CM

## 2013-11-15 MED ORDER — PHENTERMINE HCL 37.5 MG PO TABS: 37.5000 mg | ORAL_TABLET | Freq: Every day | ORAL | Status: DC

## 2013-11-15 MED ORDER — BENZONATATE 200 MG PO CAPS: 200.0000 mg | ORAL_CAPSULE | Freq: Three times a day (TID) | ORAL | Status: DC | PRN

## 2013-11-15 MED ORDER — TOPIRAMATE 50 MG PO TABS
ORAL_TABLET | ORAL | Status: DC
Start: 2013-11-15 — End: 2013-12-14

## 2013-11-15 NOTE — Assessment & Plan Note (Signed)
Symptoms are mild and lungs are clear. Tessalon Perles as needed. Return if no better in about 2 weeks.

## 2013-11-15 NOTE — Assessment & Plan Note (Signed)
Refilling phentermine, adding Topamax. Return in one month.

## 2013-11-15 NOTE — Progress Notes (Signed)
  Subjective:    CC: Weight check  HPI: Obesity: Approximately 60 pound weight loss so far, she started about 230 pounds. Recently she has only lost 1 pound since the last visit. No adverse effects from the medication.  Cough: Mild, minimally productive but no hemoptysis, no shortness of breath, no rhinorrhea, sinus pain, ear pain, visual changes, chest pain. No fevers or chills. Multiple sick contacts. Symptoms been present for approximately 2 weeks.  Past medical history, Surgical history, Family history not pertinant except as noted below, Social history, Allergies, and medications have been entered into the medical record, reviewed, and no changes needed.   Review of Systems: No fevers, chills, night sweats, weight loss, chest pain, or shortness of breath.   Objective:    General: Well Developed, well nourished, and in no acute distress.  Neuro: Alert and oriented x3, extra-ocular muscles intact, sensation grossly intact.  HEENT: Normocephalic, atraumatic, pupils equal round reactive to light, neck supple, no masses, no lymphadenopathy, thyroid nonpalpable. Oropharynx, nasopharynx, external ear canals are unremarkable, no tenderness to palpation over the maxillary or frontal sinuses. Skin: Warm and dry, no rashes. Cardiac: Regular rate and rhythm, no murmurs rubs or gallops, no lower extremity edema.  Respiratory: Clear to auscultation bilaterally. Not using accessory muscles, speaking in full sentences.  Impression and Recommendations:

## 2013-12-14 ENCOUNTER — Ambulatory Visit (INDEPENDENT_AMBULATORY_CARE_PROVIDER_SITE_OTHER): Payer: 59 | Admitting: Sports Medicine

## 2013-12-14 ENCOUNTER — Encounter: Payer: Self-pay | Admitting: Sports Medicine

## 2013-12-14 VITALS — BP 136/93 | HR 119 | Wt 156.0 lb

## 2013-12-14 DIAGNOSIS — E669 Obesity, unspecified: Secondary | ICD-10-CM

## 2013-12-14 MED ORDER — TOPIRAMATE 50 MG PO TABS: ORAL_TABLET | ORAL | Status: DC

## 2013-12-14 MED ORDER — PHENTERMINE HCL 37.5 MG PO CAPS: 37.5000 mg | ORAL_CAPSULE | ORAL | Status: DC

## 2013-12-14 NOTE — Assessment & Plan Note (Addendum)
Excellent continued weight loss. Refill phentermine and Topamax. Donna Tucker has now met her goal weight, her body mass index is still slightly outside of the normal range, we will aim for a BMI of around 23 , this will be approximately 140 pounds. Return in one month.

## 2013-12-14 NOTE — Progress Notes (Signed)
  Subjective:    CC: Weight check  HPI: Continues to do very well, did get some tingling initially on Topamax, has lost an additional 10 pounds.  Past medical history, Surgical history, Family history not pertinant except as noted below, Social history, Allergies, and medications have been entered into the medical record, reviewed, and no changes needed.   Review of Systems: No fevers, chills, night sweats, weight loss, chest pain, or shortness of breath.   Objective:    General: Well Developed, well nourished, and in no acute distress.  Neuro: Alert and oriented x3, extra-ocular muscles intact, sensation grossly intact.  HEENT: Normocephalic, atraumatic, pupils equal round reactive to light, neck supple, no masses, no lymphadenopathy, thyroid nonpalpable.  Skin: Warm and dry, no rashes. Cardiac: Regular rate and rhythm, no murmurs rubs or gallops, no lower extremity edema.  Respiratory: Clear to auscultation bilaterally. Not using accessory muscles, speaking in full sentences.  Impression and Recommendations:

## 2013-12-18 ENCOUNTER — Encounter: Payer: Self-pay | Admitting: *Deleted

## 2014-01-11 ENCOUNTER — Ambulatory Visit: Payer: 59 | Admitting: Physician Assistant

## 2014-01-14 ENCOUNTER — Ambulatory Visit (INDEPENDENT_AMBULATORY_CARE_PROVIDER_SITE_OTHER): Payer: 59 | Admitting: Sports Medicine

## 2014-01-14 ENCOUNTER — Encounter: Payer: Self-pay | Admitting: Sports Medicine

## 2014-01-14 VITALS — BP 132/91 | HR 97 | Wt 153.0 lb

## 2014-01-14 DIAGNOSIS — E669 Obesity, unspecified: Secondary | ICD-10-CM

## 2014-01-14 MED ORDER — TOPIRAMATE 50 MG PO TABS: ORAL_TABLET | ORAL | Status: DC

## 2014-01-14 MED ORDER — PHENTERMINE HCL 37.5 MG PO TABS: ORAL_TABLET | ORAL | Status: DC

## 2014-01-14 NOTE — Assessment & Plan Note (Signed)
Initial weight was 230 pounds, we started phentermine at 191 , current weight is 150 pounds. At this point we are going to down taper her phentermine and Topamax. Return to see me in one month, then 6 months

## 2014-01-14 NOTE — Progress Notes (Signed)
  Subjective:    CC: Weight check  HPI: This pleasant 28 year old female comes back, she has been on phentermine now for approximately 5 months, she has lost a total of 40 pounds on phentermine, and 40 pounds prior to being on phentermine for a total of approximately 80 pound weight loss. She does desire to decrease use of phentermine and Topamax and taper off of it.  Past medical history, Surgical history, Family history not pertinant except as noted below, Social history, Allergies, and medications have been entered into the medical record, reviewed, and no changes needed.   Review of Systems: No fevers, chills, night sweats, weight loss, chest pain, or shortness of breath.   Objective:    General: Well Developed, well nourished, and in no acute distress.  Neuro: Alert and oriented x3, extra-ocular muscles intact, sensation grossly intact.  HEENT: Normocephalic, atraumatic, pupils equal round reactive to light, neck supple, no masses, no lymphadenopathy, thyroid nonpalpable.  Skin: Warm and dry, no rashes. Cardiac: Regular rate and rhythm, no murmurs rubs or gallops, no lower extremity edema.  Respiratory: Clear to auscultation bilaterally. Not using accessory muscles, speaking in full sentences.  Impression and Recommendations:

## 2014-02-14 ENCOUNTER — Ambulatory Visit: Payer: 59 | Admitting: Sports Medicine

## 2014-03-08 LAB — OB RESULTS CONSOLE RPR: RPR: NONREACTIVE

## 2014-03-08 LAB — OB RESULTS CONSOLE HEPATITIS B SURFACE ANTIGEN: HEP B S AG: NEGATIVE

## 2014-03-08 LAB — OB RESULTS CONSOLE HIV ANTIBODY (ROUTINE TESTING): HIV: NONREACTIVE

## 2014-03-08 LAB — OB RESULTS CONSOLE ANTIBODY SCREEN: Antibody Screen: NEGATIVE

## 2014-03-08 LAB — OB RESULTS CONSOLE RUBELLA ANTIBODY, IGM: Rubella: NON-IMMUNE/NOT IMMUNE

## 2014-03-08 LAB — OB RESULTS CONSOLE ABO/RH: RH Type: POSITIVE

## 2014-03-21 LAB — OB RESULTS CONSOLE GC/CHLAMYDIA
CHLAMYDIA, DNA PROBE: NEGATIVE
GC PROBE AMP, GENITAL: NEGATIVE

## 2014-09-10 LAB — OB RESULTS CONSOLE GBS: GBS: NEGATIVE

## 2014-09-24 ENCOUNTER — Encounter (HOSPITAL_COMMUNITY): Payer: Self-pay | Admitting: *Deleted

## 2014-09-24 ENCOUNTER — Other Ambulatory Visit: Payer: Self-pay | Admitting: Obstetrics and Gynecology

## 2014-09-24 ENCOUNTER — Telehealth (HOSPITAL_COMMUNITY): Payer: Self-pay | Admitting: *Deleted

## 2014-09-24 NOTE — Telephone Encounter (Signed)
Preadmission screen  

## 2014-09-26 ENCOUNTER — Encounter (HOSPITAL_COMMUNITY): Payer: Self-pay | Admitting: Obstetrics and Gynecology

## 2014-09-26 ENCOUNTER — Inpatient Hospital Stay (HOSPITAL_COMMUNITY)
Admission: AD | Admit: 2014-09-26 | Discharge: 2014-09-26 | Disposition: A | Discharge status: Home / Self Care | Payer: 59 | Source: Ambulatory Visit | Attending: Obstetrics and Gynecology | Admitting: Obstetrics and Gynecology

## 2014-09-26 DIAGNOSIS — O133 Gestational [pregnancy-induced] hypertension without significant proteinuria, third trimester: Secondary | ICD-10-CM | POA: Insufficient documentation

## 2014-09-26 DIAGNOSIS — E048 Other specified nontoxic goiter: Secondary | ICD-10-CM | POA: Diagnosis not present

## 2014-09-26 DIAGNOSIS — R030 Elevated blood-pressure reading, without diagnosis of hypertension: Secondary | ICD-10-CM | POA: Diagnosis present

## 2014-09-26 DIAGNOSIS — O99283 Endocrine, nutritional and metabolic diseases complicating pregnancy, third trimester: Secondary | ICD-10-CM | POA: Diagnosis not present

## 2014-09-26 DIAGNOSIS — Z3A38 38 weeks gestation of pregnancy: Secondary | ICD-10-CM | POA: Insufficient documentation

## 2014-09-26 LAB — COMPREHENSIVE METABOLIC PANEL
ALBUMIN: 2.6 g/dL — AB (ref 3.5–5.2)
ALK PHOS: 152 U/L — AB (ref 39–117)
ALT: 7 U/L (ref 0–35)
ANION GAP: 12 (ref 5–15)
AST: 12 U/L (ref 0–37)
BUN: 5 mg/dL — ABNORMAL LOW (ref 6–23)
CO2: 24 mEq/L (ref 19–32)
Calcium: 8.6 mg/dL (ref 8.4–10.5)
Chloride: 101 mEq/L (ref 96–112)
Creatinine, Ser: 0.57 mg/dL (ref 0.50–1.10)
GFR calc Af Amer: >90 mL/min (ref >90)
GFR calc non Af Amer: >90 mL/min (ref >90)
GLUCOSE: 87 mg/dL (ref 70–99)
POTASSIUM: 4.2 meq/L (ref 3.7–5.3)
SODIUM: 137 meq/L (ref 137–147)
TOTAL PROTEIN: 6.5 g/dL (ref 6.0–8.3)

## 2014-09-26 LAB — CBC
HCT: 36.7 % (ref 36.0–46.0)
HEMOGLOBIN: 12.6 g/dL (ref 12.0–15.0)
MCH: 30.7 pg (ref 26.0–34.0)
MCHC: 34.3 g/dL (ref 30.0–36.0)
MCV: 89.3 fL (ref 78.0–100.0)
Platelets: 254 10*3/uL (ref 150–400)
RBC: 4.11 MIL/uL (ref 3.87–5.11)
RDW: 14 % (ref 11.5–15.5)
WBC: 11.6 10*3/uL — ABNORMAL HIGH (ref 4.0–10.5)

## 2014-09-26 LAB — PROTEIN / CREATININE RATIO, URINE
Creatinine, Urine: 118.46 mg/dL
PROTEIN CREATININE RATIO: 0.31 — AB (ref 0.00–0.15)
Total Protein, Urine: 36.2 mg/dL

## 2014-09-26 LAB — URIC ACID: Uric Acid, Serum: 5.2 mg/dL (ref 2.4–7.0)

## 2014-09-26 NOTE — Progress Notes (Signed)
Dr. Taavon at bedside. 

## 2014-09-26 NOTE — MAU Provider Note (Signed)
History     CSN: [id number]  Arrival date and time: 09/26/14 1432   None     Chief Complaint  Patient presents with  . Hypertension   HPI Comments: 38 1/7 weeks with mildly elevated BP and frontal headache. Sent from office for evaluation. NO epigastric pain. No visual changes.   OB History   Grav Para Term Preterm Abortions TAB SAB Ect Mult Living   3    2  1 1   0      Past Medical History  Diagnosis Date  . Thyroid disease     Graves Disease  . Obesity (BMI 30-39.9)   . Hx of varicella     History reviewed. No pertinent past surgical history.  Family History  Problem Relation Age of Onset  . Diabetes Mother     T1DM  . Stroke Mother   . Hypothyroidism Mother   . Hypertension Father   . Graves' disease Maternal Aunt   . Cancer Maternal Aunt      breast  . Cancer Maternal Grandmother   . Graves' disease Maternal Grandfather   . Graves' disease Cousin   . Cancer Cousin     History  Substance Use Topics  . Smoking status: Never Smoker   . Smokeless tobacco: Never Used  . Alcohol Use: 0.6 oz/week    1 Cans of beer per week    Allergies:  Allergies  Allergen Reactions  . Nsaids Anaphylaxis, Hives and Swelling  . Salicylates Anaphylaxis, Hives and Swelling  . Lactose Intolerance (Gi)     Prescriptions prior to admission  Medication Sig Dispense Refill  . acetaminophen (TYLENOL) 500 MG tablet Take 1,000 mg by mouth every 6 (six) hours as needed for moderate pain or headache.      . calcium carbonate (TUMS - DOSED IN MG ELEMENTAL CALCIUM) 500 MG chewable tablet Chew 2 tablets by mouth as needed for indigestion or heartburn.      . diphenhydramine-acetaminophen (TYLENOL PM) 25-500 MG TABS Take 1 tablet by mouth at bedtime as needed (pain and insomnia).      . methimazole (TAPAZOLE) 10 MG tablet Take 1 tablet (10 mg total) by mouth daily.  90 tablet  3  . Prenatal Vit-Fe Fumarate-FA (PRENATAL MULTIVITAMIN) TABS tablet Take 1 tablet by mouth daily at 12  noon.      . ranitidine (ZANTAC) 150 MG tablet Take 150 mg by mouth at bedtime.      Marland Kitchen. EPINEPHrine (EPI-PEN) 0.3 mg/0.3 mL DEVI Inject 0.3 mLs (0.3 mg total) into the muscle once.  1 Device  11    Review of Systems  Constitutional: Negative.   HENT: Negative.   Eyes: Negative.   Respiratory: Negative.   Cardiovascular: Negative.   Gastrointestinal: Negative.   Genitourinary: Negative.   Musculoskeletal: Negative.   Skin: Negative.   Neurological: Negative.   Psychiatric/Behavioral: Negative.   All other systems reviewed and are negative.  Physical Exam   Blood pressure 145/94, pulse 110, temperature 99.1 F (37.3 C), temperature source Oral, resp. rate 20, height 5\' 4"  (1.626 m), weight 107.956 kg (238 lb), last menstrual period 01/04/2014.  Physical Exam  Constitutional: She is oriented to person, place, and time. She appears well-developed and well-nourished.  HENT:  Head: Normocephalic and atraumatic.  Neck: Normal range of motion. Neck supple.  Cardiovascular: Normal rate and regular rhythm.   Respiratory: Effort normal and breath sounds normal.  GI: Soft. Bowel sounds are normal.  Genitourinary: Vagina normal and uterus normal.  Musculoskeletal: Normal range of motion.  Neurological: She is alert and oriented to person, place, and time. She has normal reflexes.  Skin: Skin is warm and dry.  Psychiatric: She has a normal mood and affect.    MAU Course  Procedures  MDM na  Assessment and Plan  [redacted] weeks Gestational HTN - mild. All labs nl . BP stable. DC home . PEC precautions.  Arsal Tappan J 09/26/2014, 3:37 PM

## 2014-09-26 NOTE — MAU Note (Addendum)
Sent from office for further eval.  New problem yesterda with BP elevation.  Is in the midst of collecting of 24 urine, BP up again today. +HA, blurring and light spots/flashes, denies constant RUQ pain.  Slight increase in swelling.

## 2014-09-26 NOTE — MAU Note (Signed)
Pt states she was seen in the office because she had a headache and blurred vision and flashes when pt blinks.

## 2014-09-26 NOTE — Discharge Instructions (Signed)
Hypertension During Pregnancy °Hypertension, or high blood pressure, is when there is extra pressure inside your blood vessels that carry blood from the heart to the rest of your body (arteries). It can happen at any time in life, including pregnancy. Hypertension during pregnancy can cause problems for you and your baby. Your baby might not weigh as much as he or she should at birth or might be born early (premature). Very bad cases of hypertension during pregnancy can be life-threatening.  °Different types of hypertension can occur during pregnancy. These include: °· Chronic hypertension. This happens when a woman has hypertension before pregnancy and it continues during pregnancy. °· Gestational hypertension. This is when hypertension develops during pregnancy. °· Preeclampsia or toxemia of pregnancy. This is a very serious type of hypertension that develops only during pregnancy. It affects the whole body and can be very dangerous for both mother and baby.   °Gestational hypertension and preeclampsia usually go away after your baby is born. Your blood pressure will likely stabilize within 6 weeks. Women who have hypertension during pregnancy have a greater chance of developing hypertension later in life or with future pregnancies. °RISK FACTORS °There are certain factors that make it more likely for you to develop hypertension during pregnancy. These include: °· Having hypertension before pregnancy. °· Having hypertension during a previous pregnancy. °· Being overweight. °· Being older than 40 years. °· Being pregnant with more than one baby. °· Having diabetes or kidney problems. °SIGNS AND SYMPTOMS °Chronic and gestational hypertension rarely cause symptoms. Preeclampsia has symptoms, which may include: °· Increased protein in your urine. Your health care provider will check for this at every prenatal visit. °· Swelling of your hands and face. °· Rapid weight gain. °· Headaches. °· Visual changes. °· Being  bothered by light. °· Abdominal pain, especially in the upper right area. °· Chest pain. °· Shortness of breath. °· Increased reflexes. °· Seizures. These occur with a more severe form of preeclampsia, called eclampsia. °DIAGNOSIS  °You may be diagnosed with hypertension during a regular prenatal exam. At each prenatal visit, you may have: °· Your blood pressure checked. °· A urine test to check for protein in your urine. °The type of hypertension you are diagnosed with depends on when you developed it. It also depends on your specific blood pressure reading. °· Developing hypertension before 20 weeks of pregnancy is consistent with chronic hypertension. °· Developing hypertension after 20 weeks of pregnancy is consistent with gestational hypertension. °· Hypertension with increased urinary protein is diagnosed as preeclampsia. °· Blood pressure measurements that stay above 160 systolic or 110 diastolic are a sign of severe preeclampsia. °TREATMENT °Treatment for hypertension during pregnancy varies. Treatment depends on the type of hypertension and how serious it is. °· If you take medicine for chronic hypertension, you may need to switch medicines. °¨ Medicines called ACE inhibitors should not be taken during pregnancy. °¨ Low-dose aspirin may be suggested for women who have risk factors for preeclampsia. °· If you have gestational hypertension, you may need to take a blood pressure medicine that is safe during pregnancy. Your health care provider will recommend the correct medicine. °· If you have severe preeclampsia, you may need to be in the hospital. Health care providers will watch you and your baby very closely. You also may need to take medicine called magnesium sulfate to prevent seizures and lower blood pressure. °· Sometimes, an early delivery is needed. This may be the case if the condition worsens. It would be   done to protect you and your baby. The only cure for preeclampsia is delivery.  Your health  care provider may recommend that you take one low-dose aspirin (81 mg) each day to help prevent high blood pressure during your pregnancy if you are at risk for preeclampsia. You may be at risk for preeclampsia if:  You had preeclampsia or eclampsia during a previous pregnancy.  Your baby did not grow as expected during a previous pregnancy.  You experienced preterm birth with a previous pregnancy.  You experienced a separation of the placenta from the uterus (placental abruption) during a previous pregnancy.  You experienced the loss of your baby during a previous pregnancy.  You are pregnant with more than one baby.  You have other medical conditions, such as diabetes or an autoimmune disease. HOME CARE INSTRUCTIONS  Schedule and keep all of your regular prenatal care appointments. This is important.  Take medicines only as directed by your health care provider. Tell your health care provider about all medicines you take.  Eat as little salt as possible.  Get regular exercise.  Do not drink alcohol.  Do not use tobacco products.  Do not drink products with caffeine.  Lie on your left side when resting. SEEK IMMEDIATE MEDICAL CARE IF:  You have severe abdominal pain.  You have sudden swelling in your hands, ankles, or face.  You gain 4 pounds (1.8 kg) or more in 1 week.  You vomit repeatedly.  You have vaginal bleeding.  You do not feel your baby moving as much.  You have a headache.  You have blurred or double vision.  You have muscle twitching or spasms.  You have shortness of breath.  You have blue fingernails or lips.  You have blood in your urine. MAKE SURE YOU:  Understand these instructions.  Will watch your condition.  Will get help right away if you are not doing well or get worse.         Pt instructed to keep appointment already scheduled in office 09/27/2014  Document Released: 08/17/2011 Document Revised: 04/15/2014 Document Reviewed:  06/28/2013 [facility] Patient Information 2015 [employer]. This information is not intended to replace advice given to you by your health care provider. Make sure you discuss any questions you have with your health care provider.

## 2014-10-01 ENCOUNTER — Inpatient Hospital Stay (HOSPITAL_COMMUNITY)
Admission: RE | Admit: 2014-10-01 | Discharge: 2014-10-04 | DRG: 765 | Disposition: A | Discharge status: Home / Self Care | Payer: 59 | Source: Ambulatory Visit | Attending: Obstetrics and Gynecology | Admitting: Obstetrics and Gynecology

## 2014-10-01 ENCOUNTER — Encounter (HOSPITAL_COMMUNITY): Payer: Self-pay

## 2014-10-01 DIAGNOSIS — Z3A39 39 weeks gestation of pregnancy: Secondary | ICD-10-CM | POA: Diagnosis present

## 2014-10-01 DIAGNOSIS — Z8249 Family history of ischemic heart disease and other diseases of the circulatory system: Secondary | ICD-10-CM

## 2014-10-01 DIAGNOSIS — Z823 Family history of stroke: Secondary | ICD-10-CM | POA: Diagnosis not present

## 2014-10-01 DIAGNOSIS — O1400 Mild to moderate pre-eclampsia, unspecified trimester: Secondary | ICD-10-CM | POA: Diagnosis present

## 2014-10-01 DIAGNOSIS — E0590 Thyrotoxicosis, unspecified without thyrotoxic crisis or storm: Secondary | ICD-10-CM | POA: Diagnosis present

## 2014-10-01 DIAGNOSIS — E0500 Thyrotoxicosis with diffuse goiter without thyrotoxic crisis or storm: Secondary | ICD-10-CM | POA: Diagnosis present

## 2014-10-01 DIAGNOSIS — Z283 Underimmunization status: Secondary | ICD-10-CM

## 2014-10-01 DIAGNOSIS — O1403 Mild to moderate pre-eclampsia, third trimester: Secondary | ICD-10-CM | POA: Diagnosis present

## 2014-10-01 DIAGNOSIS — O139 Gestational [pregnancy-induced] hypertension without significant proteinuria, unspecified trimester: Secondary | ICD-10-CM

## 2014-10-01 DIAGNOSIS — Z2839 Other underimmunization status: Secondary | ICD-10-CM

## 2014-10-01 DIAGNOSIS — D62 Acute posthemorrhagic anemia: Secondary | ICD-10-CM | POA: Diagnosis present

## 2014-10-01 DIAGNOSIS — O9902 Anemia complicating childbirth: Secondary | ICD-10-CM | POA: Diagnosis present

## 2014-10-01 DIAGNOSIS — Z833 Family history of diabetes mellitus: Secondary | ICD-10-CM | POA: Diagnosis not present

## 2014-10-01 DIAGNOSIS — O99284 Endocrine, nutritional and metabolic diseases complicating childbirth: Secondary | ICD-10-CM | POA: Diagnosis present

## 2014-10-01 DIAGNOSIS — O9989 Other specified diseases and conditions complicating pregnancy, childbirth and the puerperium: Secondary | ICD-10-CM

## 2014-10-01 HISTORY — DX: Gestational (pregnancy-induced) hypertension without significant proteinuria, unspecified trimester: O13.9

## 2014-10-01 LAB — CBC
HEMATOCRIT: 36.4 % (ref 36.0–46.0)
HEMOGLOBIN: 12 g/dL (ref 12.0–15.0)
MCH: 29.6 pg (ref 26.0–34.0)
MCHC: 33 g/dL (ref 30.0–36.0)
MCV: 89.9 fL (ref 78.0–100.0)
Platelets: 270 10*3/uL (ref 150–400)
RBC: 4.05 MIL/uL (ref 3.87–5.11)
RDW: 14.5 % (ref 11.5–15.5)
WBC: 12.9 10*3/uL — AB (ref 4.0–10.5)

## 2014-10-01 LAB — COMPREHENSIVE METABOLIC PANEL
ALT: 8 U/L (ref 0–35)
ANION GAP: 15 (ref 5–15)
AST: 13 U/L (ref 0–37)
Albumin: 2.6 g/dL — ABNORMAL LOW (ref 3.5–5.2)
Alkaline Phosphatase: 148 U/L — ABNORMAL HIGH (ref 39–117)
BUN: 9 mg/dL (ref 6–23)
CHLORIDE: 100 meq/L (ref 96–112)
CO2: 22 mEq/L (ref 19–32)
Calcium: 9.3 mg/dL (ref 8.4–10.5)
Creatinine, Ser: 0.5 mg/dL (ref 0.50–1.10)
GFR calc Af Amer: >90 mL/min (ref >90)
GFR calc non Af Amer: >90 mL/min (ref >90)
Glucose, Bld: 89 mg/dL (ref 70–99)
Potassium: 4.1 mEq/L (ref 3.7–5.3)
SODIUM: 137 meq/L (ref 137–147)
TOTAL PROTEIN: 6.7 g/dL (ref 6.0–8.3)
Total Bilirubin: <0.2 mg/dL — ABNORMAL LOW (ref 0.3–1.2)

## 2014-10-01 LAB — TYPE AND SCREEN
ABO/RH(D): O POS
ANTIBODY SCREEN: NEGATIVE

## 2014-10-01 MED ORDER — ACETAMINOPHEN 325 MG PO TABS: 650.0000 mg | ORAL_TABLET | ORAL | Status: DC | PRN

## 2014-10-01 MED ORDER — OXYTOCIN BOLUS FROM INFUSION: 500.0000 mL | INTRAVENOUS | Status: DC

## 2014-10-01 MED ORDER — TERBUTALINE SULFATE 1 MG/ML IJ SOLN: 0.2500 mg | Freq: Once | INTRAMUSCULAR | Status: AC | PRN

## 2014-10-01 MED ORDER — SODIUM CHLORIDE 0.9 % IJ SOLN: 3.0000 mL | INTRAMUSCULAR | Status: DC | PRN

## 2014-10-01 MED ORDER — EPHEDRINE 5 MG/ML INJ: 10.0000 mg | INTRAVENOUS | Status: DC | PRN

## 2014-10-01 MED ORDER — OXYTOCIN 40 UNITS IN LACTATED RINGERS INFUSION - SIMPLE MED
1.0000 m[IU]/min | INTRAVENOUS | Status: DC
  Administered 2014-10-02: 2 m[IU]/min via INTRAVENOUS
  Filled 2014-10-01: qty 1000

## 2014-10-01 MED ORDER — ZOLPIDEM TARTRATE 5 MG PO TABS
5.0000 mg | ORAL_TABLET | Freq: Every evening | ORAL | Status: DC | PRN
  Administered 2014-10-01: 5 mg via ORAL
  Filled 2014-10-01: qty 1

## 2014-10-01 MED ORDER — LIDOCAINE HCL (PF) 1 % IJ SOLN
30.0000 mL | INTRAMUSCULAR | Status: DC | PRN
Start: 2014-10-01 — End: 2014-10-02

## 2014-10-01 MED ORDER — LACTATED RINGERS IV SOLN
500.0000 mL | INTRAVENOUS | Status: DC | PRN
  Administered 2014-10-02: 300 mL via INTRAVENOUS

## 2014-10-01 MED ORDER — OXYCODONE-ACETAMINOPHEN 5-325 MG PO TABS: 2.0000 | ORAL_TABLET | ORAL | Status: DC | PRN

## 2014-10-01 MED ORDER — ONDANSETRON HCL 4 MG/2ML IJ SOLN: 4.0000 mg | Freq: Four times a day (QID) | INTRAMUSCULAR | Status: DC | PRN

## 2014-10-01 MED ORDER — MISOPROSTOL 25 MCG QUARTER TABLET
25.0000 ug | ORAL_TABLET | ORAL | Status: DC | PRN
  Administered 2014-10-01 – 2014-10-02 (×2): 25 ug via VAGINAL
  Filled 2014-10-01 (×2): qty 0.25

## 2014-10-01 MED ORDER — FENTANYL 2.5 MCG/ML BUPIVACAINE 1/10 % EPIDURAL INFUSION (WH - ANES)
14.0000 mL/h | INTRAMUSCULAR | Status: DC | PRN
  Filled 2014-10-01: qty 125

## 2014-10-01 MED ORDER — DIPHENHYDRAMINE HCL 50 MG/ML IJ SOLN: 12.5000 mg | INTRAMUSCULAR | Status: DC | PRN

## 2014-10-01 MED ORDER — CITRIC ACID-SODIUM CITRATE 334-500 MG/5ML PO SOLN
30.0000 mL | ORAL | Status: DC | PRN
  Administered 2014-10-02: 30 mL via ORAL
  Filled 2014-10-01 (×2): qty 15

## 2014-10-01 MED ORDER — OXYTOCIN 40 UNITS IN LACTATED RINGERS INFUSION - SIMPLE MED: 62.5000 mL/h | INTRAVENOUS | Status: DC

## 2014-10-01 MED ORDER — OXYCODONE-ACETAMINOPHEN 5-325 MG PO TABS: 1.0000 | ORAL_TABLET | ORAL | Status: DC | PRN

## 2014-10-01 MED ORDER — LACTATED RINGERS IV SOLN: 500.0000 mL | Freq: Once | INTRAVENOUS | Status: DC

## 2014-10-01 MED ORDER — PHENYLEPHRINE 40 MCG/ML (10ML) SYRINGE FOR IV PUSH (FOR BLOOD PRESSURE SUPPORT): 80.0000 ug | PREFILLED_SYRINGE | INTRAVENOUS | Status: DC | PRN

## 2014-10-01 MED ORDER — PHENYLEPHRINE 40 MCG/ML (10ML) SYRINGE FOR IV PUSH (FOR BLOOD PRESSURE SUPPORT)
80.0000 ug | PREFILLED_SYRINGE | INTRAVENOUS | Status: DC | PRN
  Filled 2014-10-01: qty 10

## 2014-10-01 MED ORDER — FLEET ENEMA 7-19 GM/118ML RE ENEM: 1.0000 | ENEMA | RECTAL | Status: DC | PRN

## 2014-10-01 MED ORDER — LACTATED RINGERS IV SOLN
INTRAVENOUS | Status: DC
  Administered 2014-10-01 – 2014-10-02 (×5): via INTRAVENOUS

## 2014-10-01 NOTE — H&P (Signed)
Donna Tucker is a 28 y.o. female presenting for IOL for mild PEC and hyperthyroidism.  Maternal Medical History:  Contractions: Onset was less than 1 hour ago.   Perceived severity is mild.    Fetal activity: Perceived fetal activity is normal.   Last perceived fetal movement was within the past hour.    Prenatal complications: PIH and polyhydramnios.   Prenatal Complications - Diabetes: none.    OB History   Grav Para Term Preterm Abortions TAB SAB Ect Mult Living   3    2  1 1   0     Past Medical History  Diagnosis Date  . Thyroid disease     Graves Disease  . Obesity (BMI 30-39.9)   . Hx of varicella    History reviewed. No pertinent past surgical history. Family History: family history includes Cancer in her cousin, maternal aunt, and maternal grandmother; Diabetes in her mother; Luiz BlareGraves' disease in her cousin, maternal aunt, and maternal grandfather; Hypertension in her father; Hypothyroidism in her mother; Stroke in her mother. Social History:  reports that she has never smoked. She has never used smokeless tobacco. She reports that she drinks about .6 ounces of alcohol per week. She reports that she does not use illicit drugs.   Prenatal Transfer Tool  Maternal Diabetes: No Genetic Screening: Normal Maternal Ultrasounds/Referrals: Abnormal:  Findings:   Other:poly Fetal Ultrasounds or other Referrals:  None Maternal Substance Abuse:  No Significant Maternal Medications:  Meds include: Other: Methimazole Significant Maternal Lab Results:  None Other Comments:  Mild PEC  Review of Systems  Constitutional: Negative.   HENT: Negative.   Eyes: Negative.   Respiratory: Negative.   Cardiovascular: Negative.   Gastrointestinal: Negative.   Genitourinary: Negative.   Musculoskeletal: Negative.   Skin: Negative.   Neurological: Negative.   Endo/Heme/Allergies: Negative.   Psychiatric/Behavioral: Negative.     Dilation: Closed Effacement (%): Thick Station:  -2 Exam by:: Elana AlmElizabeth Cone RNC Blood pressure 109/45, pulse 79, temperature 98.9 F (37.2 C), temperature source Oral, resp. rate 20, height 5\' 4"  (1.626 m), weight 108.863 kg (240 lb), last menstrual period 01/04/2014. Maternal Exam:  Uterine Assessment: Contraction strength is mild.  Contraction frequency is irregular.   Abdomen: Patient reports no abdominal tenderness. Fetal presentation: vertex  Introitus: Normal vulva. Normal vagina.  Ferning test: not done.  Nitrazine test: not done. Amniotic fluid character: not assessed.  Pelvis: questionable for delivery.   Cervix: Cervix evaluated by digital exam.     Physical Exam  Nursing note and vitals reviewed. Constitutional: She is oriented to person, place, and time. She appears well-developed and well-nourished.  HENT:  Head: Normocephalic and atraumatic.  Neck: Normal range of motion. Neck supple.  Cardiovascular: Normal rate and regular rhythm.   Respiratory: Effort normal and breath sounds normal.  GI: Soft.  Genitourinary: Vagina normal and uterus normal.  Musculoskeletal: Normal range of motion.  Neurological: She is alert and oriented to person, place, and time. She has normal reflexes.  Skin: Skin is warm and dry.  Psychiatric: She has a normal mood and affect. Her behavior is normal.    Prenatal labs: ABO, Rh: --/--/O POS (10/20 2035) Antibody: NEG (10/20 2035) Rubella: Nonimmune (03/27 0000) RPR: Nonreactive (03/27 0000)  HBsAg: Negative (03/27 0000)  HIV: Non-reactive (03/27 0000)  GBS: Negative (09/29 0000)   Assessment/Plan: 39 weeks. Mild PEC Hyperthyroidism stable Admit IOL with cytotec   Emani Morad J 10/01/2014, 10:08 PM

## 2014-10-02 ENCOUNTER — Encounter (HOSPITAL_COMMUNITY)
Admission: RE | Disposition: A | Discharge status: Home / Self Care | Payer: Self-pay | Source: Ambulatory Visit | Attending: Obstetrics and Gynecology

## 2014-10-02 ENCOUNTER — Encounter (HOSPITAL_COMMUNITY): Payer: 59 | Admitting: Anesthesiology

## 2014-10-02 ENCOUNTER — Inpatient Hospital Stay (HOSPITAL_COMMUNITY): Payer: 59 | Admitting: Anesthesiology

## 2014-10-02 ENCOUNTER — Encounter (HOSPITAL_COMMUNITY): Payer: Self-pay

## 2014-10-02 ENCOUNTER — Encounter (HOSPITAL_COMMUNITY): Payer: 59

## 2014-10-02 ENCOUNTER — Inpatient Hospital Stay (HOSPITAL_COMMUNITY): Payer: 59

## 2014-10-02 LAB — CBC
HEMATOCRIT: 36.6 % (ref 36.0–46.0)
Hemoglobin: 12.3 g/dL (ref 12.0–15.0)
MCH: 30.4 pg (ref 26.0–34.0)
MCHC: 33.6 g/dL (ref 30.0–36.0)
MCV: 90.6 fL (ref 78.0–100.0)
Platelets: 245 10*3/uL (ref 150–400)
RBC: 4.04 MIL/uL (ref 3.87–5.11)
RDW: 14.4 % (ref 11.5–15.5)
WBC: 14.1 10*3/uL — AB (ref 4.0–10.5)

## 2014-10-02 LAB — RPR

## 2014-10-02 SURGERY — Surgical Case
Anesthesia: Spinal

## 2014-10-02 MED ORDER — DIBUCAINE 1 % RE OINT: 1.0000 "application " | TOPICAL_OINTMENT | RECTAL | Status: DC | PRN

## 2014-10-02 MED ORDER — HYDROMORPHONE HCL 1 MG/ML IJ SOLN: 0.2500 mg | INTRAMUSCULAR | Status: DC | PRN

## 2014-10-02 MED ORDER — MENTHOL 3 MG MT LOZG: 1.0000 | LOZENGE | OROMUCOSAL | Status: DC | PRN

## 2014-10-02 MED ORDER — METHYLERGONOVINE MALEATE 0.2 MG PO TABS: 0.2000 mg | ORAL_TABLET | ORAL | Status: DC | PRN

## 2014-10-02 MED ORDER — SIMETHICONE 80 MG PO CHEW
80.0000 mg | CHEWABLE_TABLET | ORAL | Status: DC
  Administered 2014-10-03 – 2014-10-04 (×2): 80 mg via ORAL
  Filled 2014-10-02 (×2): qty 1

## 2014-10-02 MED ORDER — WITCH HAZEL-GLYCERIN EX PADS: 1.0000 "application " | MEDICATED_PAD | CUTANEOUS | Status: DC | PRN

## 2014-10-02 MED ORDER — PROMETHAZINE HCL 25 MG/ML IJ SOLN: 6.2500 mg | INTRAMUSCULAR | Status: DC | PRN

## 2014-10-02 MED ORDER — SENNOSIDES-DOCUSATE SODIUM 8.6-50 MG PO TABS: 2.0000 | ORAL_TABLET | ORAL | Status: DC

## 2014-10-02 MED ORDER — SIMETHICONE 80 MG PO CHEW: 80.0000 mg | CHEWABLE_TABLET | Freq: Three times a day (TID) | ORAL | Status: DC

## 2014-10-02 MED ORDER — ONDANSETRON HCL 4 MG/2ML IJ SOLN
4.0000 mg | INTRAMUSCULAR | Status: DC | PRN
Start: 2014-10-02 — End: 2014-10-02

## 2014-10-02 MED ORDER — SIMETHICONE 80 MG PO CHEW: 80.0000 mg | CHEWABLE_TABLET | ORAL | Status: DC

## 2014-10-02 MED ORDER — MORPHINE SULFATE (PF) 0.5 MG/ML IJ SOLN
INTRAMUSCULAR | Status: DC | PRN
  Administered 2014-10-02: .2 mg via INTRATHECAL

## 2014-10-02 MED ORDER — PRENATAL MULTIVITAMIN CH
1.0000 | ORAL_TABLET | Freq: Every day | ORAL | Status: DC
  Administered 2014-10-03 – 2014-10-04 (×2): 1 via ORAL
  Filled 2014-10-02 (×2): qty 1

## 2014-10-02 MED ORDER — OXYTOCIN 40 UNITS IN LACTATED RINGERS INFUSION - SIMPLE MED: 62.5000 mL/h | INTRAVENOUS | Status: DC

## 2014-10-02 MED ORDER — DIPHENHYDRAMINE HCL 25 MG PO CAPS
25.0000 mg | ORAL_CAPSULE | Freq: Four times a day (QID) | ORAL | Status: DC | PRN
  Filled 2014-10-02: qty 1

## 2014-10-02 MED ORDER — ONDANSETRON HCL 4 MG/2ML IJ SOLN: 4.0000 mg | INTRAMUSCULAR | Status: DC | PRN

## 2014-10-02 MED ORDER — BUPIVACAINE HCL (PF) 0.25 % IJ SOLN
INTRAMUSCULAR | Status: DC | PRN
  Administered 2014-10-02: 10 mL

## 2014-10-02 MED ORDER — LACTATED RINGERS IV SOLN: INTRAVENOUS | Status: DC

## 2014-10-02 MED ORDER — ONDANSETRON HCL 4 MG/2ML IJ SOLN: 4.0000 mg | Freq: Three times a day (TID) | INTRAMUSCULAR | Status: DC | PRN

## 2014-10-02 MED ORDER — BUPIVACAINE HCL (PF) 0.25 % IJ SOLN
INTRAMUSCULAR | Status: AC
  Filled 2014-10-02: qty 20

## 2014-10-02 MED ORDER — OXYTOCIN 40 UNITS IN LACTATED RINGERS INFUSION - SIMPLE MED: 62.5000 mL/h | INTRAVENOUS | Status: AC

## 2014-10-02 MED ORDER — PHENYLEPHRINE 8 MG IN D5W 100 ML (0.08MG/ML) PREMIX OPTIME
INJECTION | INTRAVENOUS | Status: DC | PRN
Start: 2014-10-02 — End: 2014-10-02
  Administered 2014-10-02: 60 ug/min via INTRAVENOUS

## 2014-10-02 MED ORDER — LANOLIN HYDROUS EX OINT: 1.0000 "application " | TOPICAL_OINTMENT | CUTANEOUS | Status: DC | PRN

## 2014-10-02 MED ORDER — PRENATAL MULTIVITAMIN CH: 1.0000 | ORAL_TABLET | Freq: Every day | ORAL | Status: DC

## 2014-10-02 MED ORDER — ONDANSETRON HCL 4 MG PO TABS
4.0000 mg | ORAL_TABLET | ORAL | Status: DC | PRN
Start: 2014-10-02 — End: 2014-10-04

## 2014-10-02 MED ORDER — SCOPOLAMINE 1 MG/3DAYS TD PT72
1.0000 | MEDICATED_PATCH | Freq: Once | TRANSDERMAL | Status: DC
  Administered 2014-10-02: 1.5 mg via TRANSDERMAL

## 2014-10-02 MED ORDER — CEFAZOLIN SODIUM-DEXTROSE 2-3 GM-% IV SOLR
INTRAVENOUS | Status: DC | PRN
  Administered 2014-10-02: 2 g via INTRAVENOUS

## 2014-10-02 MED ORDER — OXYTOCIN 10 UNIT/ML IJ SOLN
40.0000 [IU] | INTRAVENOUS | Status: DC | PRN
  Administered 2014-10-02: 40 [IU] via INTRAVENOUS

## 2014-10-02 MED ORDER — FENTANYL CITRATE 0.05 MG/ML IJ SOLN
INTRAMUSCULAR | Status: DC | PRN
Start: 2014-10-02 — End: 2014-10-02
  Administered 2014-10-02: 12.5 ug via INTRATHECAL

## 2014-10-02 MED ORDER — ONDANSETRON HCL 4 MG/2ML IJ SOLN
INTRAMUSCULAR | Status: AC
  Filled 2014-10-02: qty 2

## 2014-10-02 MED ORDER — TETANUS-DIPHTH-ACELL PERTUSSIS 5-2.5-18.5 LF-MCG/0.5 IM SUSP: 0.5000 mL | Freq: Once | INTRAMUSCULAR | Status: DC

## 2014-10-02 MED ORDER — LACTATED RINGERS IV SOLN
INTRAVENOUS | Status: DC
  Administered 2014-10-02: 11:00:00 via INTRAUTERINE

## 2014-10-02 MED ORDER — INFLUENZA VAC SPLIT QUAD 0.5 ML IM SUSY
0.5000 mL | PREFILLED_SYRINGE | INTRAMUSCULAR | Status: DC
  Filled 2014-10-02: qty 0.5

## 2014-10-02 MED ORDER — DIPHENHYDRAMINE HCL 50 MG/ML IJ SOLN: 12.5000 mg | INTRAMUSCULAR | Status: DC | PRN

## 2014-10-02 MED ORDER — METHYLERGONOVINE MALEATE 0.2 MG/ML IJ SOLN: 0.2000 mg | INTRAMUSCULAR | Status: DC | PRN

## 2014-10-02 MED ORDER — ZOLPIDEM TARTRATE 5 MG PO TABS: 5.0000 mg | ORAL_TABLET | Freq: Every evening | ORAL | Status: DC | PRN

## 2014-10-02 MED ORDER — MORPHINE SULFATE 0.5 MG/ML IJ SOLN
INTRAMUSCULAR | Status: AC
  Filled 2014-10-02: qty 10

## 2014-10-02 MED ORDER — NALBUPHINE HCL 10 MG/ML IJ SOLN
5.0000 mg | INTRAMUSCULAR | Status: DC | PRN
  Administered 2014-10-02: 5 mg via INTRAVENOUS
  Administered 2014-10-02: 19:00:00 via INTRAVENOUS
  Administered 2014-10-03 (×2): 5 mg via INTRAVENOUS
  Filled 2014-10-02 (×3): qty 1

## 2014-10-02 MED ORDER — SIMETHICONE 80 MG PO CHEW
80.0000 mg | CHEWABLE_TABLET | ORAL | Status: DC | PRN
Start: 2014-10-02 — End: 2014-10-02

## 2014-10-02 MED ORDER — NALOXONE HCL 1 MG/ML IJ SOLN
1.0000 ug/kg/h | INTRAVENOUS | Status: DC | PRN
  Filled 2014-10-02: qty 2

## 2014-10-02 MED ORDER — NALBUPHINE HCL 10 MG/ML IJ SOLN: 5.0000 mg | Freq: Once | INTRAMUSCULAR | Status: DC | PRN

## 2014-10-02 MED ORDER — OXYCODONE-ACETAMINOPHEN 5-325 MG PO TABS
2.0000 | ORAL_TABLET | ORAL | Status: DC | PRN
  Administered 2014-10-03 – 2014-10-04 (×5): 2 via ORAL
  Filled 2014-10-02 (×5): qty 2

## 2014-10-02 MED ORDER — SIMETHICONE 80 MG PO CHEW
80.0000 mg | CHEWABLE_TABLET | Freq: Three times a day (TID) | ORAL | Status: DC
  Administered 2014-10-03 – 2014-10-04 (×4): 80 mg via ORAL
  Filled 2014-10-02 (×4): qty 1

## 2014-10-02 MED ORDER — OXYCODONE-ACETAMINOPHEN 5-325 MG PO TABS: 2.0000 | ORAL_TABLET | ORAL | Status: DC | PRN

## 2014-10-02 MED ORDER — MEPERIDINE HCL 25 MG/ML IJ SOLN: 6.2500 mg | INTRAMUSCULAR | Status: DC | PRN

## 2014-10-02 MED ORDER — SIMETHICONE 80 MG PO CHEW: 80.0000 mg | CHEWABLE_TABLET | ORAL | Status: DC | PRN

## 2014-10-02 MED ORDER — OXYCODONE-ACETAMINOPHEN 5-325 MG PO TABS
1.0000 | ORAL_TABLET | ORAL | Status: DC | PRN
  Administered 2014-10-03 – 2014-10-04 (×3): 1 via ORAL
  Filled 2014-10-02 (×3): qty 1

## 2014-10-02 MED ORDER — BUPIVACAINE IN DEXTROSE 0.75-8.25 % IT SOLN
INTRATHECAL | Status: DC | PRN
  Administered 2014-10-02: 1.6 mL via INTRATHECAL

## 2014-10-02 MED ORDER — SODIUM CHLORIDE 0.9 % IJ SOLN
INTRAMUSCULAR | Status: AC
  Filled 2014-10-02: qty 20

## 2014-10-02 MED ORDER — PHENYLEPHRINE 8 MG IN D5W 100 ML (0.08MG/ML) PREMIX OPTIME
INJECTION | INTRAVENOUS | Status: AC
  Filled 2014-10-02: qty 100

## 2014-10-02 MED ORDER — ONDANSETRON HCL 4 MG PO TABS: 4.0000 mg | ORAL_TABLET | ORAL | Status: DC | PRN

## 2014-10-02 MED ORDER — METHIMAZOLE 10 MG PO TABS
10.0000 mg | ORAL_TABLET | Freq: Every day | ORAL | Status: DC
  Administered 2014-10-02 – 2014-10-03 (×2): 10 mg via ORAL
  Filled 2014-10-02 (×3): qty 1

## 2014-10-02 MED ORDER — MENTHOL 3 MG MT LOZG
1.0000 | LOZENGE | OROMUCOSAL | Status: DC | PRN
Start: 2014-10-02 — End: 2014-10-02

## 2014-10-02 MED ORDER — OXYTOCIN 10 UNIT/ML IJ SOLN
INTRAMUSCULAR | Status: AC
  Filled 2014-10-02: qty 1

## 2014-10-02 MED ORDER — SENNOSIDES-DOCUSATE SODIUM 8.6-50 MG PO TABS
2.0000 | ORAL_TABLET | ORAL | Status: DC
  Administered 2014-10-03 – 2014-10-04 (×2): 2 via ORAL
  Filled 2014-10-02 (×2): qty 2

## 2014-10-02 MED ORDER — SCOPOLAMINE 1 MG/3DAYS TD PT72
MEDICATED_PATCH | TRANSDERMAL | Status: AC
  Filled 2014-10-02: qty 1

## 2014-10-02 MED ORDER — SODIUM CHLORIDE 0.9 % IJ SOLN: 3.0000 mL | INTRAMUSCULAR | Status: DC | PRN

## 2014-10-02 MED ORDER — ONDANSETRON HCL 4 MG/2ML IJ SOLN
INTRAMUSCULAR | Status: DC | PRN
  Administered 2014-10-02: 4 mg via INTRAVENOUS

## 2014-10-02 MED ORDER — DIPHENHYDRAMINE HCL 25 MG PO CAPS: 25.0000 mg | ORAL_CAPSULE | Freq: Four times a day (QID) | ORAL | Status: DC | PRN

## 2014-10-02 MED ORDER — FENTANYL CITRATE 0.05 MG/ML IJ SOLN
INTRAMUSCULAR | Status: AC
  Filled 2014-10-02: qty 2

## 2014-10-02 MED ORDER — OXYCODONE-ACETAMINOPHEN 5-325 MG PO TABS: 1.0000 | ORAL_TABLET | ORAL | Status: DC | PRN

## 2014-10-02 MED ORDER — DIPHENHYDRAMINE HCL 25 MG PO CAPS
25.0000 mg | ORAL_CAPSULE | ORAL | Status: DC | PRN
  Administered 2014-10-04: 25 mg via ORAL

## 2014-10-02 MED ORDER — ACETAMINOPHEN 325 MG PO TABS
650.0000 mg | ORAL_TABLET | ORAL | Status: DC | PRN
  Administered 2014-10-02: 650 mg via ORAL
  Filled 2014-10-02 (×2): qty 2

## 2014-10-02 MED ORDER — NALOXONE HCL 0.4 MG/ML IJ SOLN: 0.4000 mg | INTRAMUSCULAR | Status: DC | PRN

## 2014-10-02 MED ORDER — NALBUPHINE HCL 10 MG/ML IJ SOLN
5.0000 mg | INTRAMUSCULAR | Status: DC | PRN
  Administered 2014-10-03: 5 mg via SUBCUTANEOUS
  Filled 2014-10-02 (×2): qty 1

## 2014-10-02 SURGICAL SUPPLY — 38 items
BENZOIN TINCTURE PRP APPL 2/3 (GAUZE/BANDAGES/DRESSINGS) ×3 IMPLANT
CLAMP CORD UMBIL (MISCELLANEOUS) IMPLANT
CLOSURE WOUND 1/2 X4 (GAUZE/BANDAGES/DRESSINGS) ×1
CLOTH BEACON ORANGE TIMEOUT ST (SAFETY) ×3 IMPLANT
CONTAINER PREFILL 10% NBF 15ML (MISCELLANEOUS) IMPLANT
DERMABOND ADHESIVE PROPEN (GAUZE/BANDAGES/DRESSINGS) ×2
DERMABOND ADVANCED .7 DNX6 (GAUZE/BANDAGES/DRESSINGS) ×1 IMPLANT
DRAPE SHEET LG 3/4 BI-LAMINATE (DRAPES) IMPLANT
DRSG OPSITE POSTOP 4X10 (GAUZE/BANDAGES/DRESSINGS) ×3 IMPLANT
DURAPREP 26ML APPLICATOR (WOUND CARE) ×3 IMPLANT
ELECT REM PT RETURN 9FT ADLT (ELECTROSURGICAL) ×3
ELECTRODE REM PT RTRN 9FT ADLT (ELECTROSURGICAL) ×1 IMPLANT
EXTRACTOR VACUUM M CUP 4 TUBE (SUCTIONS) IMPLANT
EXTRACTOR VACUUM M CUP 4' TUBE (SUCTIONS)
GLOVE BIO SURGEON STRL SZ7.5 (GLOVE) ×3 IMPLANT
GOWN STRL REUS W/TWL LRG LVL3 (GOWN DISPOSABLE) ×6 IMPLANT
KIT ABG SYR 3ML LUER SLIP (SYRINGE) IMPLANT
NEEDLE HYPO 25X1 1.5 SAFETY (NEEDLE) ×3 IMPLANT
NEEDLE HYPO 25X5/8 SAFETYGLIDE (NEEDLE) IMPLANT
NEEDLE SPNL 20GX3.5 QUINCKE YW (NEEDLE) IMPLANT
NS IRRIG 1000ML POUR BTL (IV SOLUTION) ×3 IMPLANT
PACK C SECTION WH (CUSTOM PROCEDURE TRAY) ×3 IMPLANT
STAPLER VISISTAT 35W (STAPLE) IMPLANT
STRIP CLOSURE SKIN 1/2X4 (GAUZE/BANDAGES/DRESSINGS) ×2 IMPLANT
SUT MNCRL 0 VIOLET CTX 36 (SUTURE) ×2 IMPLANT
SUT MNCRL AB 3-0 PS2 27 (SUTURE) ×3 IMPLANT
SUT MON AB 2-0 CT1 27 (SUTURE) ×3 IMPLANT
SUT MON AB-0 CT1 36 (SUTURE) ×6 IMPLANT
SUT MONOCRYL 0 CTX 36 (SUTURE) ×4
SUT PLAIN 0 NONE (SUTURE) IMPLANT
SUT PLAIN 2 0 (SUTURE)
SUT PLAIN 2 0 XLH (SUTURE) ×3 IMPLANT
SUT PLAIN ABS 2-0 CT1 27XMFL (SUTURE) IMPLANT
SYR 20CC LL (SYRINGE) IMPLANT
SYR CONTROL 10ML LL (SYRINGE) ×3 IMPLANT
TOWEL OR 17X24 6PK STRL BLUE (TOWEL DISPOSABLE) ×3 IMPLANT
TRAY FOLEY CATH 14FR (SET/KITS/TRAYS/PACK) ×3 IMPLANT
WATER STERILE IRR 1000ML POUR (IV SOLUTION) ×3 IMPLANT

## 2014-10-02 NOTE — Anesthesia Postprocedure Evaluation (Signed)
Anesthesia Post Note  Patient: Donna Tucker  Procedure(s) Performed: Procedure(s) (LRB): CESAREAN SECTION (N/A)  Anesthesia type: Spinal  Patient location: PACU  Post pain: Pain level controlled  Post assessment: Post-op Vital signs reviewed  Last Vitals:  Filed Vitals:   10/02/14 1700  BP: 121/70  Pulse: 78  Temp:   Resp: 22    Post vital signs: Reviewed  Level of consciousness: awake  Complications: No apparent anesthesia complications

## 2014-10-02 NOTE — Anesthesia Postprocedure Evaluation (Deleted)
  Anesthesia Post-op Note  Patient: Donna Tucker  Procedure(s) Performed: Procedure(s): CESAREAN SECTION (N/A)  Patient Location: PACU  Anesthesia Type:Spinal  Level of Consciousness: awake, alert  and oriented  Airway and Oxygen Therapy: Patient Spontanous Breathing  Post-op Pain: none  Post-op Assessment: Post-op Vital signs reviewed, Patient's Cardiovascular Status Stable, Respiratory Function Stable, No headache, No backache, No residual numbness and No residual motor weakness  Post-op Vital Signs: Reviewed and stable  Last Vitals:  Filed Vitals:   10/02/14 1518  BP:   Pulse:   Temp:   Resp: 20    Complications: No apparent anesthesia complications

## 2014-10-02 NOTE — Progress Notes (Signed)
Donna Tucker is a 28 y.o. G3P0020 at 6676w0d by LMP admitted for induction of labor due to Hydramnios and Pre-eclamptic toxemia of pregnancy..  Subjective: Comfortable Still with headache  Objective: BP 137/76  Pulse 82  Temp(Src) 98 F (36.7 C) (Oral)  Resp 18  Ht 5\' 4"  (1.626 m)  Wt 108.863 kg (240 lb)  BMI 41.18 kg/m2  LMP 01/04/2014      FHT:  FHR: 145 bpm, variability: moderate,  accelerations:  Present,  decelerations:  Present occ variable with late return UC:   irregular, every 5 minutes SVE:   2/60/-1 AROM-clear IUPC placed  Labs: Lab Results  Component Value Date   WBC 12.9* 10/01/2014   HGB 12.0 10/01/2014   HCT 36.4 10/01/2014   MCV 89.9 10/01/2014   PLT 270 10/01/2014    Assessment / Plan: Induction of labor due to preeclampsia,  progressing well on pitocin  Labor: Progressing normally Preeclampsia:  no signs or symptoms of toxicity, intake and ouput balanced and labs stable Fetal Wellbeing:  Category I and Category II Pain Control:  Labor support without medications I/D:  n/a Anticipated MOD:  NSVD  [name] 10/02/2014, 8:02 AM

## 2014-10-02 NOTE — Anesthesia Procedure Notes (Signed)
Spinal  Patient location during procedure: OR Start time: 10/02/2014 3:26 PM End time: 10/02/2014 3:28 PM Preanesthetic Checklist Completed: patient identified, surgical consent, pre-op evaluation, timeout performed, IV checked, risks and benefits discussed and monitors and equipment checked Spinal Block Patient position: sitting Prep: site prepped and draped and DuraPrep Patient monitoring: heart rate, cardiac monitor, continuous pulse ox and blood pressure Approach: midline Location: L3-4 Injection technique: single-shot Needle Needle type: Pencan  Needle gauge: 24 G Needle length: 9 cm Needle insertion depth: 6 cm Assessment Sensory level: T4

## 2014-10-02 NOTE — Progress Notes (Signed)
Donna Tucker is a 28 y.o. G3P0020 at 5483w0d by LMP admitted for induction of labor due to Hydramnios and Pre-eclamptic toxemia of pregnancy..  Subjective: More uncomfortable  Objective: BP 101/75  Pulse 80  Temp(Src) 98.2 F (36.8 C) (Oral)  Resp 20  Ht 5\' 4"  (1.626 m)  Wt 108.863 kg (240 lb)  BMI 41.18 kg/m2  LMP 01/04/2014      FHT:  FHR: 145-150 bpm, variability: moderate,  accelerations:  Present,  decelerations:  Absent UC:   irregular, every 5 minutes SVE:   Dilation: 2.5 Effacement (%): 60 Station: -1 Exam by:: Dr Chestine Sporeaavan  Labs: Lab Results  Component Value Date   WBC 12.9* 10/01/2014   HGB 12.0 10/01/2014   HCT 36.4 10/01/2014   MCV 89.9 10/01/2014   PLT 270 10/01/2014    Assessment / Plan: Induction of labor due to mild PEC,  progressing well on pitocin Non reassuring FHR resolved after dc Pitocin but contractions now inadequate.  Labor: minimal progress in latent phase Preeclampsia:  no signs or symptoms of toxicity, intake and ouput balanced and labs stable Fetal Wellbeing:  Category I Pain Control:  Labor support without medications I/D:  n/a Anticipated MOD:  Hight csec probability Restart pitocin and obtain epidural Watch FHR closely  Donna Tucker 10/02/2014, 1:03 PM

## 2014-10-02 NOTE — Op Note (Signed)
Cesarean Section Procedure Note  Indications: non-reassuring fetal status  Pre-operative Diagnosis: 39 week 0 day pregnancy.  Post-operative Diagnosis: same  Surgeon: Lenoard AdenAAVON,Correen Bubolz J   Assistants: Kathi LudwigBailey CNM, FACM  Anesthesia: Local anesthesia 0.25.% bupivacaine and Spinal anesthesia  ASA Class: 2  Procedure Details  The patient was seen in the Holding Room. The risks, benefits, complications, treatment options, and expected outcomes were discussed with the patient.  The patient concurred with the proposed plan, giving informed consent. The risks of anesthesia, infection, bleeding and possible injury to other organs discussed. Injury to bowel, bladder, or ureter with possible need for repair discussed. Possible need for transfusion with secondary risks of hepatitis or HIV acquisition discussed. Post operative complications to include but not limited to DVT, PE and Pneumonia noted. The site of surgery properly noted/marked. The patient was taken to Operating Room # 9, identified as Donna Tucker and the procedure verified as C-Section Delivery. A Time Out was held and the above information confirmed.  After induction of anesthesia, the patient was draped and prepped in the usual sterile manner. A Pfannenstiel incision was made and carried down through the subcutaneous tissue to the fascia. Fascial incision was made and extended transversely using Mayo scissors. The fascia was separated from the underlying rectus tissue superiorly and inferiorly. The peritoneum was identified and entered. Peritoneal incision was extended longitudinally. The utero-vesical peritoneal reflection was incised transversely and the bladder flap was bluntly freed from the lower uterine segment. A low transverse uterine incision(Kerr hysterotomy) was made. Delivered from OA presentation was a  female with Apgar scores of 9 at one minute and 9 at five minutes. Bulb suctioning gently performed. Neonatal team in  attendance.After the umbilical cord was clamped and cut cord blood was obtained for evaluation. The placenta was removed intact and appeared normal. The uterus was curetted with a dry lap pack. Good hemostasis was noted.The uterine outline, tubes and ovaries appeared normal. The uterine incision was closed with running locked sutures of 0 Monocryl x 2 layers. Hemostasis was observed. Lavage was carried out until clear.The parietal peritoneum was closed with a running 2-0 Monocryl suture. The fascia was then reapproximated with running sutures of 0 Monocryl. The skin was reapproximated with 3-0 monocryl after Vassar closure with 2-0 plain.  Instrument, sponge, and needle counts were correct prior the abdominal closure and at the conclusion of the case.   Findings: FLTLF, Frank x one  Estimated Blood Loss:  500         Drains: foley                 Specimens: placenta                  Complications:  None; patient tolerated the procedure well.         Disposition: PACU - hemodynamically stable.         Condition: stable  Attending Attestation: I performed the procedure.

## 2014-10-02 NOTE — Progress Notes (Signed)
Patient ID: Donna Tucker, female   DOB: May 27, 1986, 28 y.o.   MRN: [id number] Restarted Pitocin  With increasing contraction frequency, late decels resumed Pitocin now off and fetus has recovered with reassuring tracing Will proceed with cesarean section for inability to augment labor. Consent signed.  Risks vs benefits of surgery discussed.

## 2014-10-02 NOTE — Plan of Care (Signed)
Problem: Phase I Progression Outcomes Goal: Assess per MD/Nurse,Routine-VS,FHR,UC,Head to Toe assess Outcome: Progressing Pt is being induced for preeclampsia. Reviewed s/s of preeclampsia with patient. Pt denies any s.s at this time. Will continue to monitor pt BP.

## 2014-10-02 NOTE — Progress Notes (Signed)
Patient ID: Donna Tucker, female   DOB: 1986-05-21, 28 y.o.   MRN: [id number] FHT stable. 145-150 with BTBV 5-25 No recurrent decels noted Category 1 tracing- will continue Pitocin with IUPC

## 2014-10-02 NOTE — Progress Notes (Signed)
Dr Arby BarretteHatchett explained differences between epidural and spinal anesthesia, pt verbalized understanding and decided to proceed with spinal in OR since c-section has been called.

## 2014-10-02 NOTE — Anesthesia Preprocedure Evaluation (Signed)
Anesthesia Evaluation  Patient identified by MRN, date of birth, ID band Patient awake    Reviewed: Allergy & Precautions, H&P , NPO status , Patient's Chart, lab work & pertinent test results  Airway Mallampati: II TM Distance: >3 FB Neck ROM: full    Dental no notable dental hx.    Pulmonary neg pulmonary ROS,    Pulmonary exam normal       Cardiovascular     Neuro/Psych negative neurological ROS  negative psych ROS   GI/Hepatic negative GI ROS, Neg liver ROS,   Endo/Other  Hyperthyroidism   Renal/GU negative Renal ROS     Musculoskeletal   Abdominal (+) + obese,   Peds  Hematology negative hematology ROS (+)   Anesthesia Other Findings   Reproductive/Obstetrics (+) Pregnancy                           Anesthesia Physical Anesthesia Plan  ASA: III  Anesthesia Plan: Epidural   Post-op Pain Management:    Induction:   Airway Management Planned:   Additional Equipment:   Intra-op Plan:   Post-operative Plan:   Informed Consent: I have reviewed the patients History and Physical, chart, labs and discussed the procedure including the risks, benefits and alternatives for the proposed anesthesia with the patient or authorized representative who has indicated his/her understanding and acceptance.     Plan Discussed with:   Anesthesia Plan Comments:         Anesthesia Quick Evaluation

## 2014-10-02 NOTE — Anesthesia Preprocedure Evaluation (Signed)
Anesthesia Evaluation  Patient identified by MRN, date of birth, ID band Patient awake    Reviewed: Allergy & Precautions, H&P , NPO status , Patient's Chart, lab work & pertinent test results  Airway Mallampati: II TM Distance: >3 FB Neck ROM: full    Dental no notable dental hx.    Pulmonary neg pulmonary ROS,    Pulmonary exam normal       Cardiovascular     Neuro/Psych negative neurological ROS  negative psych ROS   GI/Hepatic negative GI ROS, Neg liver ROS,   Endo/Other  Hyperthyroidism   Renal/GU negative Renal ROS     Musculoskeletal   Abdominal (+) + obese,   Peds  Hematology negative hematology ROS (+)   Anesthesia Other Findings   Reproductive/Obstetrics (+) Pregnancy                           Anesthesia Physical  Anesthesia Plan  ASA: III  Anesthesia Plan: Spinal   Post-op Pain Management:    Induction:   Airway Management Planned:   Additional Equipment:   Intra-op Plan:   Post-operative Plan:   Informed Consent: I have reviewed the patients History and Physical, chart, labs and discussed the procedure including the risks, benefits and alternatives for the proposed anesthesia with the patient or authorized representative who has indicated his/her understanding and acceptance.     Plan Discussed with: CRNA and Surgeon  Anesthesia Plan Comments:         Anesthesia Quick Evaluation

## 2014-10-02 NOTE — Plan of Care (Signed)
Problem: Phase II Progression Outcomes Goal: Fetal monitoring per orders Discussed significance of FHR decelerations with pt and reason for nursing interventions.

## 2014-10-02 NOTE — Progress Notes (Signed)
Dr Billy Coastaavon explained risks and benefits of c-section for fetal intolerance of labor.  Pt verbalized understanding and signed consent.

## 2014-10-02 NOTE — Progress Notes (Signed)
Dr Billy Coastaavon updated on initiation of amnioinfusion, FHR, and UC pattern.  Orders given to discontinue pitocin and call MD back in 30 min.

## 2014-10-02 NOTE — Addendum Note (Signed)
Addendum created 10/02/14 1511 by Shanon PayorSuzanne M Charvez Voorhies, CRNA   Modules edited: Anesthesia Responsible Staff

## 2014-10-02 NOTE — Transfer of Care (Signed)
Immediate Anesthesia Transfer of Care Note  Patient: Donna Tucker  Procedure(s) Performed: Procedure(s): CESAREAN SECTION (N/A)  Patient Location: PACU  Anesthesia Type:Spinal  Level of Consciousness: awake, alert  and oriented  Airway & Oxygen Therapy: Patient Spontanous Breathing  Post-op Assessment: Report given to PACU RN and Post -op Vital signs reviewed and stable  Post vital signs: Reviewed and stable  Complications: No apparent anesthesia complications

## 2014-10-03 ENCOUNTER — Encounter (HOSPITAL_COMMUNITY): Payer: Self-pay | Admitting: Obstetrics and Gynecology

## 2014-10-03 DIAGNOSIS — O1400 Mild to moderate pre-eclampsia, unspecified trimester: Secondary | ICD-10-CM | POA: Diagnosis present

## 2014-10-03 DIAGNOSIS — E0590 Thyrotoxicosis, unspecified without thyrotoxic crisis or storm: Secondary | ICD-10-CM | POA: Diagnosis present

## 2014-10-03 DIAGNOSIS — Z283 Underimmunization status: Secondary | ICD-10-CM

## 2014-10-03 DIAGNOSIS — O9989 Other specified diseases and conditions complicating pregnancy, childbirth and the puerperium: Secondary | ICD-10-CM

## 2014-10-03 DIAGNOSIS — D62 Acute posthemorrhagic anemia: Secondary | ICD-10-CM | POA: Diagnosis not present

## 2014-10-03 DIAGNOSIS — Z2839 Other underimmunization status: Secondary | ICD-10-CM

## 2014-10-03 LAB — CBC
HCT: 31.8 % — ABNORMAL LOW (ref 36.0–46.0)
Hemoglobin: 10.7 g/dL — ABNORMAL LOW (ref 12.0–15.0)
MCH: 30.6 pg (ref 26.0–34.0)
MCHC: 33.6 g/dL (ref 30.0–36.0)
MCV: 90.9 fL (ref 78.0–100.0)
Platelets: 190 10*3/uL (ref 150–400)
RBC: 3.5 MIL/uL — ABNORMAL LOW (ref 3.87–5.11)
RDW: 14.7 % (ref 11.5–15.5)
WBC: 13.7 10*3/uL — ABNORMAL HIGH (ref 4.0–10.5)

## 2014-10-03 LAB — BIRTH TISSUE RECOVERY COLLECTION (PLACENTA DONATION)

## 2014-10-03 MED ORDER — POLYSACCHARIDE IRON COMPLEX 150 MG PO CAPS
150.0000 mg | ORAL_CAPSULE | Freq: Every day | ORAL | Status: DC
  Administered 2014-10-03 – 2014-10-04 (×2): 150 mg via ORAL
  Filled 2014-10-03 (×2): qty 1

## 2014-10-03 MED ORDER — MEASLES, MUMPS & RUBELLA VAC ~~LOC~~ INJ
0.5000 mL | INJECTION | Freq: Once | SUBCUTANEOUS | Status: AC
  Administered 2014-10-04: 0.5 mL via SUBCUTANEOUS
  Filled 2014-10-03 (×2): qty 0.5

## 2014-10-03 MED ORDER — INFLUENZA VAC SPLIT QUAD 0.5 ML IM SUSY
0.5000 mL | PREFILLED_SYRINGE | INTRAMUSCULAR | Status: AC
  Administered 2014-10-03: 0.5 mL via INTRAMUSCULAR
  Filled 2014-10-03: qty 0.5

## 2014-10-03 NOTE — Progress Notes (Signed)
POD # 1  Subjective: Pt reports feeling well/ Pain controlled with Percocet Tolerating po/ Foley d/c'd and voiding without problems/ No n/v/ Flatus present No HA, visual disturbances, or epigastric pain Activity: ad lib Bleeding is light Newborn info:  Information for the patient's newborn:  Donna Tucker, Donna Tucker [[id number]]  female Feeding: breast   Objective:  VS:  Filed Vitals:   10/03/14 0015 10/03/14 0215 10/03/14 0410 10/03/14 0817  BP:  118/67 107/61 125/73  Pulse:  95 90 82  Temp: 98.9 F (37.2 C) 98.9 F (37.2 C) 98.1 F (36.7 C) 98.5 F (36.9 C)  TempSrc: Oral Oral Oral Oral  Resp: '20 18 18 18  ' Height:      Weight:      SpO2: 98% 96% 96%      I&O: Intake/Output     10/21 0701 - 10/22 0700 10/22 0701 - 10/23 0700   I.V. (mL/kg) 1400 (12.9)    Total Intake(mL/kg) 1400 (12.9)    Urine (mL/kg/hr) 1825 (0.7) 600 (1.5)   Blood 600 (0.2)    Total Output 2425 600   Net -1025 -600           Recent Labs  10/02/14 1311 10/03/14 0640  WBC 14.1* 13.7*  HGB 12.3 10.7*  HCT 36.6 31.8*  PLT 245 190    Blood type: --/--/O POS (10/20 2035) Rubella: Nonimmune (03/27 0000)    Physical Exam:  General: alert and cooperative CV: Regular rate and rhythm Resp: CTA bilaterally Abdomen: soft, nontender, normal bowel sounds Incision: healing well, no drainage, no erythema, no hernia, no seroma, no swelling, well approximated, honeycomb dsg c/d/i Uterine Fundus: firm, below umbilicus, nontender Lochia: minimal Ext: edema trace BLE and Homans sign is negative, no sign of DVT    Assessment: POD # 1/ G3P1021/ S/P C/Section d/t non-reassurring FHT Mild PEC, delivered-stable Hyperthyroidism Rubella non-immune Mild ABL anemia Doing well  Plan: Ambulate Continue routine post op orders MMR prior to d/c   Signed: Julianne Handler, N, MSN, CNM 10/03/2014, 10:35 AM

## 2014-10-03 NOTE — Lactation Note (Signed)
This note was copied from the chart of Donna Tucker. Lactation Consultation Note  Mother has large pendulous breasts.  Reviewed how to position her hands when breastfeeding. Reviewed hand expression.  Mother was happy to see good flow of colostrum on left breast. Assisted mother in latching on right side in football hold.  Reminded mother to prepump with hand pump. Baby latched with assitance.  Sucks and swallows observed some with stimulation for more than 15 min. FOB burped baby.  Attempted various positions on left nipple and what worked best was Medical sales representativebiological nursing position. Baby latched for 10 more minutes and fell asleep.   Encouraged mother to prepump before breastfeeding and wear shells.     Patient Name: Donna Tucker ONGEX'BToday's Date: 10/03/2014 Reason for consult: Follow-up assessment   Maternal Data    Feeding Feeding Type: Breast Fed Length of feed: 25 min  LATCH Score/Interventions Latch: Repeated attempts needed to sustain latch, nipple held in mouth throughout feeding, stimulation needed to elicit sucking reflex. Intervention(s): Skin to skin;Teach feeding cues;Waking techniques Intervention(s): Breast massage;Breast compression;Assist with latch;Adjust position  Audible Swallowing: A few with stimulation  Type of Nipple: Everted at rest and after stimulation  Comfort (Breast/Nipple): Soft / non-tender     Hold (Positioning): Assistance needed to correctly position infant at breast and maintain latch.  LATCH Score: 7  Lactation Tools Discussed/Used     Consult Status Consult Status: Follow-up Date: 10/04/14 Follow-up type: In-patient    Dahlia ByesBerkelhammer, Ruth [facility] Campus[facility]os[facility] 10/03/2014, 1:12 PM

## 2014-10-03 NOTE — Lactation Note (Addendum)
This note was copied from the chart of Donna Tucker. Lactation Consultation Note New mom, took BF classes. Has large pendulum breast. Rt. Breast softer and nipple compresses well, has some edema to breast d/t dependent. Lt. Breast very heavy w/pitting edema to outer lt. Side and under and around areola. Nipple not compressible d/t edema. Reverse pressure to soften tissue, slightly helpful. Elevated Lt. Breast on pillow. ICE applied. Mom stated baby will latch to Rt. Breast not Lt. Explained reason d/t edema can't get nipple into baby's mouth. Hand expression demonstrated, none noted. Hand pump given and shells. Instructed not to wear shells until edema decreases and reason. Attempted to BF in football position to Rt. Breast, baby started gagging and had throthy sputum and suctioned w/bulb syring. Baby put STS w/mom d/t not interested in BF at this time. Had large poop and pee as well. Patient Name: Donna Tucker UJWJX'BToday's Date: 10/03/2014 Reason for consult: Initial assessment   Maternal Data Has patient been taught Hand Expression?: Yes Does the patient have breastfeeding experience prior to this delivery?: No  Feeding Feeding Type: Breast Fed Length of feed: 10 min  LATCH Score/Interventions    Intervention(s): Skin to skin;Hand expression;Alternate breast massage  Type of Nipple: Everted at rest and after stimulation  Comfort (Breast/Nipple): Soft / non-tender     Hold (Positioning): Assistance needed to correctly position infant at breast and maintain latch. Intervention(s): Breastfeeding basics reviewed;Support Pillows;Position options;Skin to skin     Lactation Tools Discussed/Used Tools: Shells;Pump (not to use shells today d/t edema) Shell Type: Inverted Breast pump type: Manual Pump Review: Setup, frequency, and cleaning;Milk Storage Initiated by:: Peri JeffersonL. Tekoa Amon RN Date initiated:: 10/03/14   Consult Status Consult Status: Follow-up Date:  10/03/14 Follow-up type: In-patient    Rosalene Wardrop, Diamond NickelLAURA G 10/03/2014, 7:08 AM

## 2014-10-03 NOTE — Anesthesia Postprocedure Evaluation (Signed)
  Anesthesia Post-op Note  Patient: Donna Tucker  Procedure(s) Performed: Procedure(s): CESAREAN SECTION (N/A)  Patient Location: Mother/Baby  Anesthesia Type:Spinal  Level of Consciousness: awake, alert , oriented and patient cooperative  Airway and Oxygen Therapy: Patient Spontanous Breathing  Post-op Pain: mild  Post-op Assessment: Post-op Vital signs reviewed, Patient's Cardiovascular Status Stable, Respiratory Function Stable, Patent Airway, No headache, No backache, No residual numbness and No residual motor weakness  Post-op Vital Signs: Reviewed and stable  Last Vitals:  Filed Vitals:   10/03/14 0817  BP: 125/73  Pulse: 82  Temp: 36.9 C  Resp: 18    Complications: No apparent anesthesia complications

## 2014-10-03 NOTE — Addendum Note (Signed)
Addendum created 10/03/14 0845 by Yolonda KidaAlison L Traver Meckes, CRNA   Modules edited: Notes Section   Notes Section:  File: [id number]

## 2014-10-04 MED ORDER — OXYCODONE-ACETAMINOPHEN 5-325 MG PO TABS: 2.0000 | ORAL_TABLET | ORAL | Status: DC | PRN

## 2014-10-04 NOTE — Progress Notes (Signed)
POSTOPERATIVE DAY # 2 S/P CS  S:         Reports feeling well             Tolerating po intake / no nausea / no vomiting / + flatus / no BM             Bleeding is light             Pain controlled with motrin and percocet             Up ad lib / ambulatory/ voiding QS  Newborn breast feeding   O:  VS: BP 122/86  Pulse 87  Temp(Src) 98 F (36.7 C) (Oral)  Resp 20  Ht 5\' 4"  (1.626 m)  Wt 108.863 kg (240 lb)  BMI 41.18 kg/m2  SpO2 98%  LMP 01/04/2014  Breastfeeding? Unknown   LABS:               Recent Labs  10/02/14 1311 10/03/14 0640  WBC 14.1* 13.7*  HGB 12.3 10.7*  PLT 245 190               Bloodtype: --/--/O POS (10/20 2035)  Rubella: Nonimmune (03/27 0000)                                             I&O: Intake/Output     10/22 0701 - 10/23 0700 10/23 0701 - 10/24 0700   P.O. 3470 200   I.V. (mL/kg)     Total Intake(mL/kg) 3470 (31.9) 200 (1.8)   Urine (mL/kg/hr) 5575 (2.1) 475 (1)   Blood     Total Output 5575 475   Net -2105 -275        Urine Occurrence 1 x                 Physical Exam:             Alert and Oriented X3  Lungs: Clear and unlabored  Heart: regular rate and rhythm / no mumurs  Abdomen: soft, non-tender, non-distended, BS normal             Fundus: firm, non-tender, U-1             Dressing intact honeycomb              Incision:  approximated with suture / no erythema / no ecchymosis / no drainage  Perineum: intact  Lochia: light  Extremities: 1+ pedal edema, no calf pain or tenderness, negative Homans  A:        POD # 2 S/P CS             P:        Routine postoperative care              DC home - [facility] booklet - instructions reviewed    Marlinda MikeBAILEY, Navayah Sok CNM, MSN, FACNM 10/04/2014, 11:15 AM

## 2014-10-04 NOTE — Discharge Summary (Signed)
POSTOPERATIVE DISCHARGE SUMMARY:  Patient ID: Donna Tucker MRN: [id number] DOB/AGE: 09-Jul-1986 28 y.o.  Admit date: 10/01/2014 Admission Diagnoses: 39 weeks / mild preeclampsia / hydramnios  Discharge date:   Discharge Diagnoses: POD 2 s/p cesarean section  Prenatal history: [id number]   EDC : 10/09/2014, by Other Basis  [facility]  Primary provider : Taavon Prenatal course complicated by obesity / mild pre-eclampsia / Graves disease  Prenatal Labs: ABO, Rh: --/--/O POS (10/20 2035) Antibody: NEG (10/20 2035) Rubella: Nonimmune (03/27 0000)   RPR: NON REAC (10/20 2035)  HBsAg: Negative (03/27 0000)  HIV: Non-reactive (03/27 0000)  GTT : NL GBS: Negative (09/29 0000)   Medical / Surgical History :  Past medical history:  Past Medical History  Diagnosis Date  . Thyroid disease     Graves Disease  . Obesity (BMI 30-39.9)   . Hx of varicella     Past surgical history:  Past Surgical History  Procedure Laterality Date  . Cesarean section N/A 10/02/2014    Procedure: CESAREAN SECTION;  Surgeon: Lenoard Adenichard J Taavon, MD;  Location: [facility];  Service: Obstetrics;  Laterality: N/A;    Family History:  Family History  Problem Relation Age of Onset  . Diabetes Mother     T1DM  . Stroke Mother   . Hypothyroidism Mother   . Hypertension Father   . Graves' disease Maternal Aunt   . Cancer Maternal Aunt      breast  . Cancer Maternal Grandmother   . Graves' disease Maternal Grandfather   . Graves' disease Cousin   . Cancer Cousin     Social History:  reports that she has never smoked. She has never used smokeless tobacco. She reports that she drinks about .6 ounces of alcohol per week. She reports that she does not use illicit drugs.  Allergies: Nsaids; Salicylates; and Lactose intolerance (gi)   Current Medications at time of admission:  Prior to Admission medications   Medication Sig Start Date End Date Taking? Authorizing  Provider  acetaminophen (TYLENOL) 500 MG tablet Take 1,000 mg by mouth every 6 (six) hours as needed for moderate pain or headache.   Yes Historical Provider, MD  calcium carbonate (TUMS - DOSED IN MG ELEMENTAL CALCIUM) 500 MG chewable tablet Chew 2 tablets by mouth as needed for indigestion or heartburn.   Yes Historical Provider, MD  diphenhydramine-acetaminophen (TYLENOL PM) 25-500 MG TABS Take 1 tablet by mouth at bedtime as needed (pain and insomnia).   Yes Historical Provider, MD  diphenhydramine-calamine (CALADRYL) 1-8 % LOTN Apply 1 application topically 2 (two) times daily as needed (itching).   Yes Historical Provider, MD  labetalol (NORMODYNE) 100 MG tablet Take 100 mg by mouth 2 (two) times daily.   Yes Historical Provider, MD  methimazole (TAPAZOLE) 10 MG tablet Take 1 tablet (10 mg total) by mouth daily. 08/14/13  Yes Monica Bectonhomas J Thekkekandam, MD  Prenatal Vit-Fe Fumarate-FA (PRENATAL MULTIVITAMIN) TABS tablet Take 1 tablet by mouth daily at 12 noon.   Yes Historical Provider, MD  ranitidine (ZANTAC) 150 MG tablet Take 150 mg by mouth at bedtime.   Yes Historical Provider, MD  EPINEPHrine (EPI-PEN) 0.3 mg/0.3 mL DEVI Inject 0.3 mLs (0.3 mg total) into the muscle once. 08/25/12   Monica Bectonhomas J Thekkekandam, MD    Intrapartum Course:  Admit for induction of labor with labor progression to 2.5 dilation with recurrent FHR decelerations  Interventions required: cesarean section   Procedures: Cesarean section delivery  on 10/02/2014 with delivery of  female newborn by Dr Billy Coastaavon   See operative report for further details APGAR (1 MIN): 9   APGAR (5 MINS): 9    Postoperative / postpartum course:  Uncomplicated with discharge on POD 2   Discharge Instructions:  Discharged Condition: stable  Activity: pelvic rest and postoperative restrictions x 2   Diet: routine  Medications:    Medication List         acetaminophen 500 MG tablet  Commonly known as:  TYLENOL  Take 1,000 mg by  mouth every 6 (six) hours as needed for moderate pain or headache.     CALADRYL 1-8 % Lotn  Generic drug:  diphenhydramine-calamine  Apply 1 application topically 2 (two) times daily as needed (itching).     calcium carbonate 500 MG chewable tablet  Commonly known as:  TUMS - dosed in mg elemental calcium  Chew 2 tablets by mouth as needed for indigestion or heartburn.     diphenhydramine-acetaminophen 25-500 MG Tabs  Commonly known as:  TYLENOL PM  Take 1 tablet by mouth at bedtime as needed (pain and insomnia).     EPINEPHrine 0.3 mg/0.3 mL Devi  Commonly known as:  EPI-PEN  Inject 0.3 mLs (0.3 mg total) into the muscle once.     labetalol 100 MG tablet  Commonly known as:  NORMODYNE  Take 100 mg by mouth 2 (two) times daily.     methimazole 10 MG tablet  Commonly known as:  TAPAZOLE  Take 1 tablet (10 mg total) by mouth daily.     oxyCODONE-acetaminophen 5-325 MG per tablet  Commonly known as:  PERCOCET/ROXICET  Take 2 tablets by mouth every 4 (four) hours as needed (for pain scale equal to or greater than 7).     prenatal multivitamin Tabs tablet  Take 1 tablet by mouth daily at 12 noon.     ranitidine 150 MG tablet  Commonly known as:  ZANTAC  Take 150 mg by mouth at bedtime.        Wound Care: keep clean and dry / remove honeycomb POD 5 Postpartum Instructions: [facility] discharge booklet - instructions reviewed  Discharge to: Home  Follow up :  [facility] in 1 week for interval visit with nurse for BP check [facility] in 6 weeks for routine postpartum visit with DR Billy Coastaavon                Signed: Marlinda MikeBAILEY, Jadelin Eng CNM, MSN, FACNM 10/04/2014, 11:18 AM

## 2014-10-14 ENCOUNTER — Encounter (HOSPITAL_COMMUNITY): Payer: Self-pay | Admitting: Obstetrics and Gynecology

## 2014-11-21 ENCOUNTER — Emergency Department (HOSPITAL_COMMUNITY)
Admission: EM | Admit: 2014-11-21 | Discharge: 2014-11-21 | Disposition: A | Discharge status: Home / Self Care | Payer: 59 | Attending: Emergency Medicine | Admitting: Emergency Medicine

## 2014-11-21 ENCOUNTER — Encounter (HOSPITAL_COMMUNITY): Payer: Self-pay | Admitting: *Deleted

## 2014-11-21 DIAGNOSIS — Z8619 Personal history of other infectious and parasitic diseases: Secondary | ICD-10-CM | POA: Insufficient documentation

## 2014-11-21 DIAGNOSIS — Z79899 Other long term (current) drug therapy: Secondary | ICD-10-CM | POA: Diagnosis not present

## 2014-11-21 DIAGNOSIS — Z203 Contact with and (suspected) exposure to rabies: Secondary | ICD-10-CM

## 2014-11-21 DIAGNOSIS — E669 Obesity, unspecified: Secondary | ICD-10-CM | POA: Diagnosis not present

## 2014-11-21 DIAGNOSIS — Z23 Encounter for immunization: Secondary | ICD-10-CM | POA: Insufficient documentation

## 2014-11-21 MED ORDER — RABIES VACCINE, PCEC IM SUSR
1.0000 mL | Freq: Once | INTRAMUSCULAR | Status: AC
  Administered 2014-11-21: 1 mL via INTRAMUSCULAR
  Filled 2014-11-21: qty 1

## 2014-11-21 MED ORDER — RABIES IMMUNE GLOBULIN 150 UNIT/ML IM INJ
20.0000 [IU]/kg | INJECTION | Freq: Once | INTRAMUSCULAR | Status: AC
  Administered 2014-11-21: 1425 [IU] via INTRAMUSCULAR
  Filled 2014-11-21: qty 9.5

## 2014-11-21 NOTE — ED Notes (Signed)
Pt comes in with c/o exposure to a bat in their living space that was found on Tuesday.  Nanda QuintonBat is in the process of being tested for rabies.  No bites.  NAD.

## 2014-11-21 NOTE — Discharge Instructions (Signed)
· You received rabies immunoglobulin and vaccine today.  You will need an additional vaccines on 12/13, 12/17, and 12/14.  Seek medical attention immediately for: ° °· Fatigue and weakness. °· Agitation. °· Anxiety. °· Confusion. °· Unusual behavior, such as hyperactivity, fear of water (hydrophobia), or fear of air (aerophobia). °· Hallucinations. °· Insomnia. °· Weakness in the arms or legs. °· Difficulty swallowing. ° ° °Rabies  °Rabies is a viral infection that can be spread to people from infected animals. The infection affects the brain and central nervous system. Once the disease develops, it almost always causes death. Because of this, when a person is bitten by an animal that may have rabies, treatment to prevent rabies often needs to be started whether or not the animal is known to be infected. Prompt treatment with the rabies vaccine and rabies immune globulin is very effective at preventing the infection from developing in people who have been exposed to the rabies virus. °CAUSES  °Rabies is caused by a virus that lives inside some animals. When a person is bitten by an infected animal, the rabies virus is spread to the person through the infected spit (saliva) of the animal. This virus can be carried by animals such as dogs, cats, skunks, bats, woodchucks, raccoons, coyotes, and foxes. °SYMPTOMS  °By the time symptoms appear, rabies is usually fatal for the person. Common symptoms include: °· Headache. °· Fever. °· Fatigue and weakness. °· Agitation. °· Anxiety. °· Confusion. °· Unusual behavior, such as hyperactivity, fear of water (hydrophobia), or fear of air (aerophobia). °· Hallucinations. °· Insomnia. °· Weakness in the arms or legs. °· Difficulty swallowing. °Most people get sick in 1-3 months after being bitten. This often varies and may depend on the location of the bite. The infection will take less time to develop if the bite occurred closer to the head.  °DIAGNOSIS  °To determine if a person  is infected, several tests must be performed, such as: °· A skin biopsy. °· A saliva test. °· A lumbar puncture to remove spinal fluid so it can be examined. °· Blood tests. °TREATMENT  °Treatment to prevent the infection from developing (post-exposure prophylaxis, PEP) is often started before knowing for sure if the person has been exposed to the rabies virus. PEP involves cleaning the wound, giving an antibody injection (rabies immune globulin), and giving a series of rabies vaccine injections. The series of injections are usually given over a two-week period. If possible, the animal that bit the person will be observed to see if it remains healthy. If the animal has been killed, it can be sent to a state laboratory and examined to see if the animal had rabies. °If a person is bitten by a domestic animal (dog, cat, or ferret) that appears healthy and can be observed to see if it remains healthy, often no further treatment is necessary other than care of the wounds caused by the animal. °Rabies is often a fatal illness once the infection develops in a person. Although a few people who developed rabies have survived after experimental treatment with certain drugs, all these survivors still had severe nervous system problems after the treatment. This is why caregivers use extra caution and begin PEP treatment for people who have been bitten by animals that are possibly infected with rabies.  °HOME CARE INSTRUCTIONS  °If you were bitten by an unknown animal, make sure you know your caregiver's instructions for follow-up. If the animal was sent to a laboratory for examination,   ask when the test results will be ready. Make sure you get the test results.  °Take these steps to care for your wound: °· Keep the wound clean, dry, and dressed as directed by your caregiver. °· Keep the injured part elevated as much as possible. °· Do not resume use of the affected area until directed. °· Only take over-the-counter or  prescription medicines as directed by your caregiver. °· Keep all follow-up appointments as directed by your caregiver. °PREVENTION  °To prevent rabies, people need to reduce their risk of having contact with infected animals.  °· Make sure your pets (dogs, cats, ferrets) are vaccinated against rabies. Keep these vaccinations up-to-date as directed by your veterinarian. °· Supervise your pets when they are outside. Keep them away from wild animals. °· Call your local animal control services to report any stray animals. These animals may not be vaccinated. °· Stay away from stray or wild animals. °· Consider getting the rabies vaccine (preexposure) if you are traveling to an area where rabies is common or if your job or activities involve possible contact with wild or stray animals. Discuss this with your caregiver. °Document Released: 11/29/2005 Document Revised: 08/23/2012 Document Reviewed: 06/27/2012 °[employer]® Patient Information ©2015 [employer]. This information is not intended to replace advice given to you by your health care provider. Make sure you discuss any questions you have with your health care provider. ° °

## 2014-11-22 NOTE — ED Provider Notes (Signed)
CSN: [id number]     Arrival date & time 11/21/14  1354 History   First MD Initiated Contact with Patient 11/21/14 1456     Chief Complaint  Patient presents with  . Animal Exposure    28 yo female presents with his family after a bat was found in the house 2 days ago. She reports a bat was found in the baby's nursery 2 days ago. The animal was captured by animal control and has been sent for testing. She reports concerns that when speaking with animal control they might have misplaced or mixed-up the bat.  She has been feeling well with no fever, decreased appetite or increased thirst. She has not noticed any scratches, bites or other skin marks on herself, husband, or children.   (Consider location/radiation/quality/duration/timing/severity/associated sxs/prior Treatment) The history is provided by the patient and the spouse.    Past Medical History  Diagnosis Date  . Thyroid disease     Graves Disease  . Obesity (BMI 30-39.9)   . Hx of varicella    Past Surgical History  Procedure Laterality Date  . Cesarean section N/A 10/02/2014    Procedure: CESAREAN SECTION;  Surgeon: Lenoard Adenichard J Taavon, MD;  Location: [facility];  Service: Obstetrics;  Laterality: N/A;   Family History  Problem Relation Age of Onset  . Diabetes Mother     T1DM  . Stroke Mother   . Hypothyroidism Mother   . Hypertension Father   . Graves' disease Maternal Aunt   . Cancer Maternal Aunt      breast  . Cancer Maternal Grandmother   . Graves' disease Maternal Grandfather   . Graves' disease Cousin   . Cancer Cousin    History  Substance Use Topics  . Smoking status: Never Smoker   . Smokeless tobacco: Never Used  . Alcohol Use: 0.6 oz/week    1 Cans of beer per week   OB History    Gravida Para Term Preterm AB TAB SAB Ectopic Multiple Living   3 1 1  2  1 1  1      Review of Systems  Constitutional: Negative for fever and activity change.  Gastrointestinal: Negative for vomiting and diarrhea.  Skin:  Negative for rash and wound.  Neurological: Negative for headaches.  All other systems reviewed and are negative.     Allergies  Nsaids; Salicylates; and Lactose intolerance (gi)  Home Medications   Prior to Admission medications   Medication Sig Start Date End Date Taking? Authorizing Provider  acetaminophen (TYLENOL) 500 MG tablet Take 1,000 mg by mouth every 6 (six) hours as needed for moderate pain or headache.    Historical Provider, MD  calcium carbonate (TUMS - DOSED IN MG ELEMENTAL CALCIUM) 500 MG chewable tablet Chew 2 tablets by mouth as needed for indigestion or heartburn.    Historical Provider, MD  diphenhydramine-acetaminophen (TYLENOL PM) 25-500 MG TABS Take 1 tablet by mouth at bedtime as needed (pain and insomnia).    Historical Provider, MD  diphenhydramine-calamine (CALADRYL) 1-8 % LOTN Apply 1 application topically 2 (two) times daily as needed (itching).    Historical Provider, MD  EPINEPHrine (EPI-PEN) 0.3 mg/0.3 mL DEVI Inject 0.3 mLs (0.3 mg total) into the muscle once. 08/25/12   Monica Bectonhomas J Thekkekandam, MD  labetalol (NORMODYNE) 100 MG tablet Take 100 mg by mouth 2 (two) times daily.    Historical Provider, MD  methimazole (TAPAZOLE) 10 MG tablet Take 1 tablet (10 mg total) by mouth daily. 08/14/13  Monica Bectonhomas J Thekkekandam, MD  oxyCODONE-acetaminophen (PERCOCET/ROXICET) 5-325 MG per tablet Take 2 tablets by mouth every 4 (four) hours as needed (for pain scale equal to or greater than 7). 10/04/14   Marlinda Mikeanya Bailey, CNM  Prenatal Vit-Fe Fumarate-FA (PRENATAL MULTIVITAMIN) TABS tablet Take 1 tablet by mouth daily at 12 noon.    Historical Provider, MD  ranitidine (ZANTAC) 150 MG tablet Take 150 mg by mouth at bedtime.    Historical Provider, MD   BP 112/76 mmHg  Pulse 94  Temp(Src) 98.2 F (36.8 C) (Oral)  Resp 20  Wt 219 lb 6.4 oz (99.519 kg)  SpO2 98% Physical Exam  Constitutional: She is oriented to person, place, and time. She appears well-developed and  well-nourished. No distress.  HENT:  Head: Normocephalic and atraumatic.  Eyes: EOM are normal. Pupils are equal, round, and reactive to light.  Neck: Normal range of motion. Neck supple.  Neurological: She is alert and oriented to person, place, and time.  Psychiatric: She has a normal mood and affect.    ED Course  Procedures (including critical care time) Labs Review Labs Reviewed - No data to display  Imaging Review No results found.   EKG Interpretation None      MDM   Final diagnoses:  Need for post exposure prophylaxis for rabies    28 yo female presents with family after possible exposure to bat for unknown amount of time 2 days ago. No visible injury, scratches or bites. Asymptomatic. She would like to proceed with rabies post exposure prophylaxis. Dosing discussed with pharmacy.    - Rabies vaccine x 1  - Rabies IGG  - Reviewed risks and benefits of vaccination and that she will need repeat vaccination on days 3, 7, and 14. Instructions provided for where to obtain vaccination.  - Strict return precautions and PCP follow up   Saverio DankerSarah E. Brooks Stotz. MD  PGY-3 [facility]     Saverio DankerSarah E Aedin Jeansonne, MD 11/22/14 1929  Saverio DankerSarah E Lillyann Ahart, MD 11/22/14 [id number]  Wendi MayaJamie N Deis, MD 11/25/14 [phone]

## 2014-11-22 NOTE — ED Provider Notes (Signed)
I saw and evaluated the patient, reviewed the resident's note and I agree with the findings and plan.   EKG Interpretation None      28 year old female here w/ husband and 2 children; all for rabies prophylaxis after bat found in youngest child's room. No bite marks. Agree w/ plan for rabies IGG and vaccine.  Wendi MayaJamie N Nijah Orlich, MD 11/22/14 [phone]

## 2014-11-24 ENCOUNTER — Emergency Department (INDEPENDENT_AMBULATORY_CARE_PROVIDER_SITE_OTHER)
Admission: EM | Admit: 2014-11-24 | Discharge: 2014-11-24 | Disposition: A | Discharge status: Home / Self Care | Payer: 59 | Source: Home / Self Care

## 2014-11-24 ENCOUNTER — Encounter (HOSPITAL_COMMUNITY): Payer: Self-pay | Admitting: *Deleted

## 2014-11-24 DIAGNOSIS — Z203 Contact with and (suspected) exposure to rabies: Secondary | ICD-10-CM

## 2014-11-24 MED ORDER — RABIES VACCINE, PCEC IM SUSR
1.0000 mL | Freq: Once | INTRAMUSCULAR | Status: AC
  Administered 2014-11-24: 1 mL via INTRAMUSCULAR

## 2014-11-24 MED ORDER — RABIES VACCINE, PCEC IM SUSR
INTRAMUSCULAR | Status: AC
  Filled 2014-11-24: qty 1

## 2014-11-24 NOTE — ED Notes (Signed)
Presents for rabies injection.  Denies any concerns.

## 2014-11-24 NOTE — Discharge Instructions (Signed)
Return 12/17 for next injection. °

## 2014-11-28 ENCOUNTER — Emergency Department (HOSPITAL_COMMUNITY)
Admission: EM | Admit: 2014-11-28 | Discharge: 2014-11-28 | Disposition: A | Discharge status: Home / Self Care | Payer: 59 | Source: Home / Self Care

## 2014-11-28 ENCOUNTER — Encounter (HOSPITAL_COMMUNITY): Payer: Self-pay | Admitting: Emergency Medicine

## 2014-11-28 MED ORDER — RABIES VACCINE, PCEC IM SUSR
1.0000 mL | Freq: Once | INTRAMUSCULAR | Status: AC
  Administered 2014-11-28: 1 mL via INTRAMUSCULAR

## 2014-11-28 MED ORDER — RABIES VACCINE, PCEC IM SUSR
INTRAMUSCULAR | Status: AC
  Filled 2014-11-28: qty 1

## 2014-11-28 NOTE — Discharge Instructions (Signed)
Please return on 12/05/2014 for next Rabies injection

## 2014-11-28 NOTE — ED Notes (Signed)
Pt is here for 3rd round of rabies injection 

## 2014-12-05 ENCOUNTER — Encounter (HOSPITAL_COMMUNITY): Payer: Self-pay | Admitting: Emergency Medicine

## 2014-12-05 ENCOUNTER — Emergency Department (INDEPENDENT_AMBULATORY_CARE_PROVIDER_SITE_OTHER)
Admission: EM | Admit: 2014-12-05 | Discharge: 2014-12-05 | Disposition: A | Discharge status: Home / Self Care | Payer: 59 | Source: Home / Self Care

## 2014-12-05 DIAGNOSIS — Z203 Contact with and (suspected) exposure to rabies: Secondary | ICD-10-CM

## 2014-12-05 MED ORDER — RABIES VACCINE, PCEC IM SUSR
1.0000 mL | Freq: Once | INTRAMUSCULAR | Status: AC
  Administered 2014-12-05: 1 mL via INTRAMUSCULAR

## 2014-12-05 MED ORDER — RABIES VACCINE, PCEC IM SUSR
INTRAMUSCULAR | Status: AC
  Filled 2014-12-05: qty 1

## 2014-12-05 NOTE — ED Notes (Signed)
Pt here for last rabies injection.  Voices no concerns at this time.  

## 2014-12-20 ENCOUNTER — Telehealth: Payer: Self-pay | Admitting: Family

## 2014-12-20 DIAGNOSIS — J019 Acute sinusitis, unspecified: Secondary | ICD-10-CM

## 2014-12-20 NOTE — Progress Notes (Signed)
We are sorry that you are not feeling well.  Here is how we plan to help!  Based on what you have shared with me it looks like you will need a face to face visit for complicated/severe symptoms related to you breastfeeding. Most of the drugs used to prescribed states the safety is unknown and advise caution.   If you are having a true medical emergency please call 911.  If you need an urgent face to face visit, [facility] has four urgent care centers for your convenience.  [facility]  [phone] Get Driving Directions Find a Provider at this Location  [address] [place], [place] [address] . 8 am to 8 pm Monday-Friday . 9 am to 7 pm Saturday-Sunday  . [facility]  [phone] Get Driving Directions Find a Provider at this Location  [address] [place], [place] [address] . 8 am to 8 pm Monday-Friday . 9 am to 6 pm Saturday . 11 am to 6 pm Sunday   . [facility] [place]  [phone] Get Driving Directions  [address] [place], [place] [address] . 8 am to 8 pm Monday-Friday . 9 am to 4 pm Saturday-Sunday   . [facility] (a walk in primary care provider)  [phone]  Get Driving Directions Find a Provider at this Location  [address] [place], [place] [address] . 8 am to 8:30 pm Monday-Thursday . 8 am to 6 pm Friday . 8 am to 4 pm Saturday-Sunday  Your e-visit answers were reviewed by a board certified advanced clinical practitioner to complete your personal care plan.  Depending on the condition, your plan could have included both over the counter or prescription medications.  You will get an e-mail in the next two days asking about your experience.  I hope that your e-visit has been valuable and will speed your recovery . Thank you for choosing an e-visit.

## 2015-04-11 ENCOUNTER — Telehealth: Payer: Self-pay | Admitting: Nurse Practitioner

## 2015-04-11 DIAGNOSIS — J101 Influenza due to other identified influenza virus with other respiratory manifestations: Secondary | ICD-10-CM

## 2015-04-11 MED ORDER — OSELTAMIVIR PHOSPHATE 75 MG PO CAPS: 75.0000 mg | ORAL_CAPSULE | Freq: Every day | ORAL | Status: DC

## 2015-04-11 NOTE — Progress Notes (Signed)
E visit for Flu like symptoms   We are sorry that you are not feeling well.  Here is how we plan to help! Based on what you have shared with me it looks like you may have possible exposure to a virus that causes influenza.  Influenza or "the flu" is   an infection caused by a respiratory virus. The flu virus is highly contagious and persons who did not receive their yearly flu vaccination may "catch" the flu from close contact.  We have anti-viral medications to treat the viruses that cause this infection. They are not a "cure" and only shorten the course of the infection. These prescriptions are most effective when they are given within the first 2 days of "flu" symptoms. Antiviral medication are indicated if you have a high risk of complications from the flu. You should  also consider an antiviral medication if you are in close contact with someone who is at risk. These medications can help patients avoid complications from the flu  but have side effects that you should know. Possible side effects from Tamiflu or oseltamivir include nausea, vomiting, diarrhea, dizziness, headaches, eye redness, sleep problems or other respiratory symptoms. You should not take Tamiflu if you have an allergy to oseltamivir or any to the ingredients in Tamiflu.  Based upon your symptoms and potential risk factors I have prescribed Oseltamivir (Tamiflu).  It has been sent to your designated pharmacy.  You will take one 75 mg capsule orally twice a day for the next 5 days.  ANYONE WHO HAS FLU SYMPTOMS SHOULD: . Stay home. The flu is highly contagious and going out or to work exposes others! . Be sure to drink plenty of fluids. Water is fine as well as fruit juices, sodas and electrolyte beverages. You may want to stay away from caffeine or alcohol. If you are nauseated, try taking small sips of liquids. How do you know if you are getting enough fluid? Your urine should be a pale yellow or almost colorless. . Get  rest. . Taking a steamy shower or using a humidifier may help nasal congestion and ease sore throat pain. Using a saline nasal spray works much the same way. . Cough drops, hard candies and sore throat lozenges may ease your cough. . Line up a caregiver. Have someone check on you regularly.   GET HELP RIGHT AWAY IF: . You cannot keep down liquids or your medications. . You become short of breath . Your fell like you are going to pass out or loose consciousness. . Your symptoms persist after you have completed your treatment plan MAKE SURE YOU   Understand these instructions.  Will watch your condition.  Will get help right away if you are not doing well or get worse.  Your e-visit answers were reviewed by a board certified advanced clinical practitioner to complete your personal care plan.  Depending on the condition, your plan could have included both over the counter or prescription medications.  If there is a problem please reply  once you have received a response from your provider.  Your safety is important to us.  If you have drug allergies check your prescription carefully.    You can use MyChart to ask questions about today's visit, request a non-urgent call back, or ask for a work or school excuse.  You will get an e-mail in the next two days asking about your experience.  I hope that your e-visit has been valuable and will speed your   recovery. Thank you for using e-visits.   

## 2015-07-10 ENCOUNTER — Emergency Department (INDEPENDENT_AMBULATORY_CARE_PROVIDER_SITE_OTHER)
Admission: EM | Admit: 2015-07-10 | Discharge: 2015-07-10 | Disposition: A | Discharge status: Home / Self Care | Payer: 59 | Source: Home / Self Care | Attending: Emergency Medicine | Admitting: Emergency Medicine

## 2015-07-10 ENCOUNTER — Encounter: Payer: Self-pay | Admitting: *Deleted

## 2015-07-10 DIAGNOSIS — J0100 Acute maxillary sinusitis, unspecified: Secondary | ICD-10-CM

## 2015-07-10 MED ORDER — FLUTICASONE PROPIONATE 50 MCG/ACT NA SUSP: NASAL | Status: DC

## 2015-07-10 MED ORDER — AMOXICILLIN 875 MG PO TABS: ORAL_TABLET | ORAL | Status: DC

## 2015-07-10 NOTE — ED Notes (Signed)
Pt reports hand foot mouth last week. About 6 days ago developed cough, congestion, facial pain and hoarseness. Denies fever.

## 2015-07-10 NOTE — ED Provider Notes (Signed)
CSN: [id number]     Arrival date & time 07/10/15  1049 History   First MD Initiated Contact with Patient 07/10/15 1054     Chief Complaint  Patient presents with  . Cough  . Facial Pain   (Consider location/radiation/quality/duration/timing/severity/associated sxs/prior Treatment) HPI Pt reports hand foot mouth last week.--Those symptoms resolved after 3 days.  About 6 days ago developed cough, congestion, facial pain and hoarseness. May have a low-grade fever        SINUSITIS  Onset:6 days Facial/sinus pressure with discolored nasal mucus.    Severity: moderate Tried OTC meds without significant relief.  Symptoms:  + Low-grade Fever  + URI prodrome with nasal congestion + Minimal swollen neck glands + mild Sinus Headache + mild ear pressure  No Allergy symptoms No significant Sore Throat No eye symptoms     No significant Cough No chest pain No shortness of breath  No wheezing  No Abdominal Pain No Nausea No Vomiting No diarrhea  No Myalgias No focal neurologic symptoms No syncope No Rash  No Urinary symptoms  Remainder of Review of Systems negative for acute change except as noted in the HPI.   Past Medical History  Diagnosis Date  . Thyroid disease     Graves Disease  . Obesity (BMI 30-39.9)   . Hx of varicella    Past Surgical History  Procedure Laterality Date  . Cesarean section N/A 10/02/2014    Procedure: CESAREAN SECTION;  Surgeon: Lenoard Aden, MD;  Location: [facility];  Service: Obstetrics;  Laterality: N/A;   Family History  Problem Relation Age of Onset  . Diabetes Mother     T1DM  . Stroke Mother   . Hypothyroidism Mother   . Hypertension Father   . Graves' disease Maternal Aunt   . Cancer Maternal Aunt      breast  . Cancer Maternal Grandmother   . Graves' disease Maternal Grandfather   . Graves' disease Cousin   . Cancer Cousin    History  Substance Use Topics  . Smoking status: Never Smoker   . Smokeless tobacco:  Never Used  . Alcohol Use: No   OB History    Gravida Para Term Preterm AB TAB SAB Ectopic Multiple Living   Review of Systems Remainder of Review of Systems negative for acute change except as noted in the HPI. She denies chance pregnancy Allergies  Nsaids; Salicylates; and Lactose intolerance (gi)  Home Medications   Prior to Admission medications   Medication Sig Start Date End Date Taking? Authorizing Provider  acetaminophen (TYLENOL) 500 MG tablet Take 1,000 mg by mouth every 6 (six) hours as needed for moderate pain or headache.    Historical Provider, MD  amoxicillin (AMOXIL) 875 MG tablet Take 1 twice a day X 10 days. 07/10/15   Lajean Manes, MD  diphenhydramine-acetaminophen (TYLENOL PM) 25-500 MG TABS Take 1 tablet by mouth at bedtime as needed (pain and insomnia).    Historical Provider, MD  EPINEPHrine (EPI-PEN) 0.3 mg/0.3 mL DEVI Inject 0.3 mLs (0.3 mg total) into the muscle once. 08/25/12   Monica Becton, MD  fluticasone Aleda Grana) 50 MCG/ACT nasal spray 1 or 2 sprays each nostril twice a day 07/10/15   Lajean Manes, MD  methimazole (TAPAZOLE) 10 MG tablet Take 1 tablet (10 mg total) by mouth daily. 08/14/13   Monica Becton, MD  ranitidine (ZANTAC) 150 MG tablet Take  150 mg by mouth at bedtime.    Historical Provider, MD   BP 138/95 mmHg  Pulse 90  Temp(Src) 98.2 F (36.8 C) (Oral)  Resp 16  Wt 207 lb (93.895 kg)  SpO2 98%  LMP 07/08/2015 Physical Exam  Constitutional: She is oriented to person, place, and time. She appears well-developed and well-nourished. No distress.  HENT:  Head: Normocephalic and atraumatic.  Right Ear: Tympanic membrane, external ear and ear canal normal.  Left Ear: Tympanic membrane, external ear and ear canal normal.  Nose: Mucosal edema and rhinorrhea present. Right sinus exhibits maxillary sinus tenderness. Left sinus exhibits maxillary sinus tenderness.  Mouth/Throat: Oropharynx is clear and moist. No  oral lesions. No oropharyngeal exudate.  Eyes: Right eye exhibits no discharge. Left eye exhibits no discharge. No scleral icterus.  Neck: Neck supple.  Cardiovascular: Normal rate, regular rhythm and normal heart sounds.   Pulmonary/Chest: Effort normal and breath sounds normal. She has no wheezes. She has no rales.  Lymphadenopathy:    She has no cervical adenopathy.  Neurological: She is alert and oriented to person, place, and time.  Skin: Skin is warm and dry.  Nursing note and vitals reviewed.   ED Course  Procedures (including critical care time) Labs Review Labs Reviewed - No data to display  Imaging Review No results found.   MDM   1. Acute maxillary sinusitis, recurrence not specified     Treatment options discussed, as well as risks, benefits, alternatives. Patient voiced understanding and agreement with the following plans: Avoid decongestants because of her history of hyperthyroid. May use OTC Mucinex. Push clear liquids. Other symptomatic care. New Prescriptions   AMOXICILLIN (AMOXIL) 875 MG TABLET    Take 1 twice a day X 10 days.   FLUTICASONE (FLONASE) 50 MCG/ACT NASAL SPRAY    1 or 2 sprays each nostril twice a day   Follow-up with your primary care doctor in 5-7 days if not improving, or sooner if symptoms become worse. Precautions discussed. Red flags discussed. Questions invited and answered. Patient voiced understanding and agreement.    Lajean Manes, MD 07/10/15 1134

## 2016-04-01 MED FILL — methIMAzole 10 MG TABS: 10 | 90 days supply | Qty: 90 | Fill #1

## 2016-06-16 ENCOUNTER — Ambulatory Visit (HOSPITAL_COMMUNITY)
Admission: EM | Admit: 2016-06-16 | Discharge: 2016-06-16 | Disposition: A | Discharge status: Home / Self Care | Payer: 59 | Attending: Emergency Medicine | Admitting: Emergency Medicine

## 2016-06-16 ENCOUNTER — Encounter (HOSPITAL_COMMUNITY): Payer: Self-pay | Admitting: Emergency Medicine

## 2016-06-16 DIAGNOSIS — M545 Low back pain, unspecified: Secondary | ICD-10-CM

## 2016-06-16 DIAGNOSIS — N3090 Cystitis, unspecified without hematuria: Secondary | ICD-10-CM | POA: Diagnosis not present

## 2016-06-16 LAB — POCT URINALYSIS DIP (DEVICE)
Bilirubin Urine: NEGATIVE
Glucose, UA: NEGATIVE mg/dL
Ketones, ur: NEGATIVE mg/dL
Nitrite: NEGATIVE
Protein, ur: NEGATIVE mg/dL
Specific Gravity, Urine: 1.01 (ref 1.005–1.030)
Urobilinogen, UA: 0.2 mg/dL (ref 0.0–1.0)
pH: 7 (ref 5.0–8.0)

## 2016-06-16 MED ORDER — CEPHALEXIN 500 MG PO CAPS: 500.0000 mg | ORAL_CAPSULE | Freq: Four times a day (QID) | ORAL | Status: DC

## 2016-06-16 MED FILL — CEPHALEXIN 500 MG CAPSULE: 500 | 7 days supply | Qty: 28 | Fill #0

## 2016-06-16 NOTE — Discharge Instructions (Signed)
Back Pain, Adult °Back pain is very common in adults. The cause of back pain is rarely dangerous and the pain often gets better over time. The cause of your back pain may not be known. Some common causes of back pain include: °· Strain of the muscles or ligaments supporting the spine. °· Wear and tear (degeneration) of the spinal disks. °· Arthritis. °· Direct injury to the back. °For many people, back pain may return. Since back pain is rarely dangerous, most people can learn to manage this condition on their own. °HOME CARE INSTRUCTIONS °Watch your back pain for any changes. The following actions may help to lessen any discomfort you are feeling: °· Remain active. It is stressful on your back to sit or stand in one place for long periods of time. Do not sit, drive, or stand in one place for more than 30 minutes at a time. Take short walks on even surfaces as soon as you are able. Try to increase the length of time you walk each day. °· Exercise regularly as directed by your health care provider. Exercise helps your back heal faster. It also helps avoid future injury by keeping your muscles strong and flexible. °· Do not stay in bed. Resting more than 1-2 days can delay your recovery. °· Pay attention to your body when you bend and lift. The most comfortable positions are those that put less stress on your recovering back. Always use proper lifting techniques, including: °· Bending your knees. °· Keeping the load close to your body. °· Avoiding twisting. °· Find a comfortable position to sleep. Use a firm mattress and lie on your side with your knees slightly bent. If you lie on your back, put a pillow under your knees. °· Avoid feeling anxious or stressed. Stress increases muscle tension and can worsen back pain. It is important to recognize when you are anxious or stressed and learn ways to manage it, such as with exercise. °· Take medicines only as directed by your health care provider. Over-the-counter  medicines to reduce pain and inflammation are often the most helpful. Your health care provider may prescribe muscle relaxant drugs. These medicines help dull your pain so you can more quickly return to your normal activities and healthy exercise. °· Apply ice to the injured area: °· Put ice in a plastic bag. °· Place a towel between your skin and the bag. °· Leave the ice on for 20 minutes, 2-3 times a day for the first 2-3 days. After that, ice and heat may be alternated to reduce pain and spasms. °· Maintain a healthy weight. Excess weight puts extra stress on your back and makes it difficult to maintain good posture. °SEEK MEDICAL CARE IF: °· You have pain that is not relieved with rest or medicine. °· You have increasing pain going down into the legs or buttocks. °· You have pain that does not improve in one week. °· You have night pain. °· You lose weight. °· You have a fever or chills. °SEEK IMMEDIATE MEDICAL CARE IF:  °· You develop new bowel or bladder control problems. °· You have unusual weakness or numbness in your arms or legs. °· You develop nausea or vomiting. °· You develop abdominal pain. °· You feel faint. °  °This information is not intended to replace advice given to you by your health care provider. Make sure you discuss any questions you have with your health care provider. °  °Document Released: 11/29/2005 Document Revised: 12/20/2014 Document Reviewed: 04/02/2014 °Elsevier Interactive Patient Education ©2016 [employer]   Inc[employer] ° °Urinary Tract Infection °Urinary tract infections (UTIs) can develop anywhere along your urinary tract. Your urinary tract is your body's drainage system for removing wastes and extra water. Your urinary tract includes two kidneys, two ureters, a bladder, and a urethra. Your kidneys are a pair of bean-shaped organs. Each kidney is about the size of your fist. They are located below your ribs, one on each side of your spine. °CAUSES °Infections are caused by microbes,  which are microscopic organisms, including fungi, viruses, and bacteria. These organisms are so small that they can only be seen through a microscope. Bacteria are the microbes that most commonly cause UTIs. °SYMPTOMS  °Symptoms of UTIs may vary by age and gender of the patient and by the location of the infection. Symptoms in young women typically include a frequent and intense urge to urinate and a painful, burning feeling in the bladder or urethra during urination. Older women and men are more likely to be tired, shaky, and weak and have muscle aches and abdominal pain. A fever may mean the infection is in your kidneys. Other symptoms of a kidney infection include pain in your back or sides below the ribs, nausea, and vomiting. °DIAGNOSIS °To diagnose a UTI, your caregiver will ask you about your symptoms. Your caregiver will also ask you to provide a urine sample. The urine sample will be tested for bacteria and white blood cells. White blood cells are made by your body to help fight infection. °TREATMENT  °Typically, UTIs can be treated with medication. Because most UTIs are caused by a bacterial infection, they usually can be treated with the use of antibiotics. The choice of antibiotic and length of treatment depend on your symptoms and the type of bacteria causing your infection. °HOME CARE INSTRUCTIONS °· If you were prescribed antibiotics, take them exactly as your caregiver instructs you. Finish the medication even if you feel better after you have only taken some of the medication. °· Drink enough water and fluids to keep your urine clear or pale yellow. °· Avoid caffeine, tea, and carbonated beverages. They tend to irritate your bladder. °· Empty your bladder often. Avoid holding urine for long periods of time. °· Empty your bladder before and after sexual intercourse. °· After a bowel movement, women should cleanse from front to back. Use each tissue only once. °SEEK MEDICAL CARE IF:  °· You have back  pain. °· You develop a fever. °· Your symptoms do not begin to resolve within 3 days. °SEEK IMMEDIATE MEDICAL CARE IF:  °· You have severe back pain or lower abdominal pain. °· You develop chills. °· You have nausea or vomiting. °· You have continued burning or discomfort with urination. °MAKE SURE YOU:  °· Understand these instructions. °· Will watch your condition. °· Will get help right away if you are not doing well or get worse. °  °This information is not intended to replace advice given to you by your health care provider. Make sure you discuss any questions you have with your health care provider. °  °Document Released: 09/08/2005 Document Revised: 08/20/2015 Document Reviewed: 01/07/2012 °Elsevier Interactive Patient Education ©2016 [employer] Inc. ° °

## 2016-06-16 NOTE — ED Provider Notes (Signed)
CSN: [id number]     Arrival date & time 06/16/16  1031 History   First MD Initiated Contact with Patient 06/16/16 1058     Chief Complaint  Patient presents with  . Back Pain  . Dysuria   (Consider location/radiation/quality/duration/timing/severity/associated sxs/prior Treatment) HPI History obtained from patient:   I have a urinary tract infection   4 days ago began to have frequency of urination 5-6 times per day, urine burns, occasional vaginal itching.  No relief from increased fluids, cranberry juice. Denies fever/chills. Also some right side back pain since yesterday  Past Medical History  Diagnosis Date  . Thyroid disease     Graves Disease  . Obesity (BMI 30-39.9)   . Hx of varicella    Past Surgical History  Procedure Laterality Date  . Cesarean section N/A 10/02/2014    Procedure: CESAREAN SECTION;  Surgeon: Lenoard Adenichard J Taavon, MD;  Location: [facility];  Service: Obstetrics;  Laterality: N/A;   Family History  Problem Relation Age of Onset  . Diabetes Mother     T1DM  . Stroke Mother   . Hypothyroidism Mother   . Hypertension Father   . Graves' disease Maternal Aunt   . Cancer Maternal Aunt      breast  . Cancer Maternal Grandmother   . Graves' disease Maternal Grandfather   . Graves' disease Cousin   . Cancer Cousin    Social History  Substance Use Topics  . Smoking status: Never Smoker   . Smokeless tobacco: Never Used  . Alcohol Use: No   OB History    Gravida Para Term Preterm AB TAB SAB Ectopic Multiple Living   3 1 1  2  1 1  1      Review of Systems  Denies: HEADACHE, NAUSEA, ABDOMINAL PAIN, CHEST PAIN, CONGESTION,   SHORTNESS OF BREATH  Allergies  Nsaids; Salicylates; Lactose intolerance (gi); and Percocet  Home Medications   Prior to Admission medications   Medication Sig Start Date End Date Taking? Authorizing Provider  methimazole (TAPAZOLE) 10 MG tablet Take 1 tablet (10 mg total) by mouth daily. 08/14/13  Yes Monica Bectonhomas J Thekkekandam, MD   acetaminophen (TYLENOL) 500 MG tablet Take 1,000 mg by mouth every 6 (six) hours as needed for moderate pain or headache.    Historical Provider, MD  amoxicillin (AMOXIL) 875 MG tablet Take 1 twice a day X 10 days. 07/10/15   Lajean Manesavid Massey, MD  cephALEXin (KEFLEX) 500 MG capsule Take 1 capsule (500 mg total) by mouth 4 (four) times daily. 06/16/16   Tharon AquasFrank C Patrick, PA  diphenhydramine-acetaminophen (TYLENOL PM) 25-500 MG TABS Take 1 tablet by mouth at bedtime as needed (pain and insomnia).    Historical Provider, MD  EPINEPHrine (EPI-PEN) 0.3 mg/0.3 mL DEVI Inject 0.3 mLs (0.3 mg total) into the muscle once. 08/25/12   Monica Bectonhomas J Thekkekandam, MD  fluticasone Aleda Grana(FLONASE) 50 MCG/ACT nasal spray 1 or 2 sprays each nostril twice a day 07/10/15   Lajean Manesavid Massey, MD  ranitidine (ZANTAC) 150 MG tablet Take 150 mg by mouth at bedtime.    Historical Provider, MD   Meds Ordered and Administered this Visit  Medications - No data to display  BP 132/84 mmHg  Pulse 73  Temp(Src) 98.4 F (36.9 C) (Oral)  Resp 18  SpO2 100%  LMP 05/29/2016 (Exact Date) No data found.   Physical Exam NURSES NOTES AND VITAL SIGNS REVIEWED. CONSTITUTIONAL: Well developed, well nourished, no acute distress HEENT: normocephalic, atraumatic EYES: Conjunctiva normal NECK:normal  ROM, supple, no adenopathy PULMONARY:No respiratory distress, normal effort ABDOMINAL: Soft, ND, NT BS+, No CVAT MUSCULOSKELETAL: Normal ROM of all extremities,  SKIN: warm and dry without rash PSYCHIATRIC: Mood and affect, behavior are normal  ED Course  Procedures (including critical care time)  Labs Review Labs Reviewed  POCT URINALYSIS DIP (DEVICE) - Abnormal; Notable for the following:    Hgb urine dipstick SMALL (*)    Leukocytes, UA SMALL (*)    All other components within normal limits   Reviewed and discussed with patient use his part of the MDM Imaging Review No results found.   Visual Acuity Review  Right Eye Distance:   Left  Eye Distance:   Bilateral Distance:    Right Eye Near:   Left Eye Near:    Bilateral Near:     Prescription for Keflex    MDM   1. Cystitis   2. Right-sided low back pain without sciatica     Patient is reassured that there are no issues that require transfer to higher level of care at this time or additional tests. Patient is advised to continue home symptomatic treatment. Patient is advised that if there are new or worsening symptoms to attend the emergency department, contact primary care provider, or return to [facility]. Instructions of care provided discharged home in stable condition.    THIS NOTE WAS GENERATED USING A VOICE RECOGNITION SOFTWARE PROGRAM. ALL REASONABLE EFFORTS  WERE MADE TO PROOFREAD THIS DOCUMENT FOR ACCURACY.  I have verbally reviewed the discharge instructions with the patient. A printed AVS was given to the patient.  All questions were answered prior to discharge.      Tharon AquasFrank C Patrick, PA 06/16/16 1140

## 2016-06-16 NOTE — ED Notes (Signed)
[facility] a [facility] of right lower back pain and dysuria x 5 days.

## 2016-07-21 ENCOUNTER — Telehealth: Payer: 59 | Admitting: Family

## 2016-07-21 DIAGNOSIS — J019 Acute sinusitis, unspecified: Secondary | ICD-10-CM

## 2016-07-21 MED ORDER — AMOXICILLIN-POT CLAVULANATE 875-125 MG PO TABS: 1.0000 | ORAL_TABLET | Freq: Two times a day (BID) | ORAL | 0 refills | Status: DC

## 2016-07-21 NOTE — Progress Notes (Signed)

## 2016-10-04 MED FILL — methIMAzole 10 MG TABS: 10 | 90 days supply | Qty: 90 | Fill #2

## 2016-11-08 ENCOUNTER — Telehealth: Payer: 59 | Admitting: Nurse Practitioner

## 2016-11-08 DIAGNOSIS — J019 Acute sinusitis, unspecified: Secondary | ICD-10-CM | POA: Diagnosis not present

## 2016-11-08 MED ORDER — AMOXICILLIN-POT CLAVULANATE 875-125 MG PO TABS: 1.0000 | ORAL_TABLET | Freq: Two times a day (BID) | ORAL | 0 refills | Status: AC

## 2016-11-08 MED ORDER — FLUTICASONE PROPIONATE 50 MCG/ACT NA SUSP: 2.0000 | Freq: Every day | NASAL | 0 refills | Status: DC

## 2016-11-08 MED FILL — AMOX TR-K CLV 875-125 MG TA: 875-125 | 10 days supply | Qty: 20 | Fill #0

## 2016-11-08 MED FILL — FLUTICASONE PROP 50 MCG SPR: 50 | 30 days supply | Qty: 16 | Fill #0

## 2016-11-08 NOTE — Progress Notes (Signed)
We are sorry that you are not feeling well.  Here is how we plan to help!  Based on what you have shared with me it looks like you have sinusitis.  Sinusitis is inflammation and infection in the sinus cavities of the head.  Based on your presentation I believe you most likely have Acute Viral Sinusitis.This is an infection most likely caused by a virus. There is not specific treatment for viral sinusitis other than to help you with the symptoms until the infection runs its course.  You may use an oral decongestant such as Mucinex D or if you have glaucoma or high blood pressure use plain Mucinex. Saline nasal spray help and can safely be used as often as needed for congestion, I have prescribed: Fluticasone nasal spray two sprays in each nostril twice a day.  In addition, I am also prescribing Amoxicillin/Clauvulanic Acid 875/125mg twice daily for 10 days based on the duration of your symptoms and no improvement from over-the-counter medications.   Some authorities believe that zinc sprays or the use of Echinacea may shorten the course of your symptoms.  Sinus infections are not as easily transmitted as other respiratory infection, however we still recommend that you avoid close contact with loved ones, especially the very young and elderly.  Remember to wash your hands thoroughly throughout the day as this is the number one way to prevent the spread of infection!  Home Care:  Only take medications as instructed by your medical team.  Complete the entire course of an antibiotic.  Do not take these medications with alcohol.  A steam or ultrasonic humidifier can help congestion.  You can place a towel over your head and breathe in the steam from hot water coming from a faucet.  Avoid close contacts especially the very young and the elderly.  Cover your mouth when you cough or sneeze.  Always remember to wash your hands.  Get Help Right Away If:  You develop worsening fever or sinus  pain.  You develop a severe head ache or visual changes.  Your symptoms persist after you have completed your treatment plan.  Make sure you  Understand these instructions.  Will watch your condition.  Will get help right away if you are not doing well or get worse.  Your e-visit answers were reviewed by a board certified advanced clinical practitioner to complete your personal care plan.  Depending on the condition, your plan could have included both over the counter or prescription medications.  If there is a problem please reply  once you have received a response from your provider.  Your safety is important to us.  If you have drug allergies check your prescription carefully.    You can use MyChart to ask questions about today's visit, request a non-urgent call back, or ask for a work or school excuse for 24 hours related to this e-Visit. If it has been greater than 24 hours you will need to follow up with your provider, or enter a new e-Visit to address those concerns.  You will get an e-mail in the next two days asking about your experience.  I hope that your e-visit has been valuable and will speed your recovery. Thank you for using e-visits.   

## 2017-03-31 DIAGNOSIS — E0500 Thyrotoxicosis with diffuse goiter without thyrotoxic crisis or storm: Secondary | ICD-10-CM | POA: Diagnosis not present

## 2017-03-31 DIAGNOSIS — E0789 Other specified disorders of thyroid: Secondary | ICD-10-CM | POA: Diagnosis not present

## 2017-03-31 DIAGNOSIS — L300 Nummular dermatitis: Secondary | ICD-10-CM | POA: Diagnosis not present

## 2017-04-01 ENCOUNTER — Ambulatory Visit (INDEPENDENT_AMBULATORY_CARE_PROVIDER_SITE_OTHER): Payer: Self-pay | Admitting: Family

## 2017-04-01 VITALS — BP 118/84 | HR 78 | Temp 97.9°F | Resp 16 | Ht 65.0 in | Wt 204.0 lb

## 2017-04-01 DIAGNOSIS — Z23 Encounter for immunization: Secondary | ICD-10-CM

## 2017-04-01 NOTE — Progress Notes (Signed)
Pt came in for updated Tdap due to cutting her left foot over the weekend.   Provider did visualize the patient's left to confirm no further treatment would be necessary.

## 2017-04-01 NOTE — Patient Instructions (Signed)
Tdap Vaccine (Tetanus, Diphtheria and Pertussis): What You Need to Know 1. Why get vaccinated? Tetanus, diphtheria and pertussis are very serious diseases. Tdap vaccine can protect us from these diseases. And, Tdap vaccine given to pregnant women can protect newborn babies against pertussis. TETANUS (Lockjaw) is rare in the [place] today. It causes painful muscle tightening and stiffness, usually all over the body.  It can lead to tightening of muscles in the head and neck so you can't open your mouth, swallow, or sometimes even breathe. Tetanus kills about 1 out of 10 people who are infected even after receiving the best medical care.  DIPHTHERIA is also rare in the [place] today. It can cause a thick coating to form in the back of the throat.  It can lead to breathing problems, heart failure, paralysis, and death.  PERTUSSIS (Whooping Cough) causes severe coughing spells, which can cause difficulty breathing, vomiting and disturbed sleep.  It can also lead to weight loss, incontinence, and rib fractures. Up to 2 in 100 adolescents and 5 in 100 adults with pertussis are hospitalized or have complications, which could include pneumonia or death.  These diseases are caused by bacteria. Diphtheria and pertussis are spread from person to person through secretions from coughing or sneezing. Tetanus enters the body through cuts, scratches, or wounds. Before vaccines, as many as 200,000 cases of diphtheria, 200,000 cases of pertussis, and hundreds of cases of tetanus, were reported in the [place] each year. Since vaccination began, reports of cases for tetanus and diphtheria have dropped by about 99% and for pertussis by about 80%. 2. Tdap vaccine Tdap vaccine can protect adolescents and adults from tetanus, diphtheria, and pertussis. One dose of Tdap is routinely given at age 11 or 12. People who did not get Tdap at that age should get it as soon as possible. Tdap is especially  important for healthcare professionals and anyone having close contact with a baby younger than 12 months. Pregnant women should get a dose of Tdap during every pregnancy, to protect the newborn from pertussis. Infants are most at risk for severe, life-threatening complications from pertussis. Another vaccine, called Td, protects against tetanus and diphtheria, but not pertussis. A Td booster should be given every 10 years. Tdap may be given as one of these boosters if you have never gotten Tdap before. Tdap may also be given after a severe cut or burn to prevent tetanus infection. Your doctor or the person giving you the vaccine can give you more information. Tdap may safely be given at the same time as other vaccines. 3. Some people should not get this vaccine  A person who has ever had a life-threatening allergic reaction after a previous dose of any diphtheria, tetanus or pertussis containing vaccine, OR has a severe allergy to any part of this vaccine, should not get Tdap vaccine. Tell the person giving the vaccine about any severe allergies.  Anyone who had coma or long repeated seizures within 7 days after a childhood dose of DTP or DTaP, or a previous dose of Tdap, should not get Tdap, unless a cause other than the vaccine was found. They can still get Td.  Talk to your doctor if you: ? have seizures or another nervous system problem, ? had severe pain or swelling after any vaccine containing diphtheria, tetanus or pertussis, ? ever had a condition called Guillain-Barr Syndrome (GBS), ? aren't feeling well on the day the shot is scheduled. 4. Risks With any medicine, including   vaccines, there is a chance of side effects. These are usually mild and go away on their own. Serious reactions are also possible but are rare. Most people who get Tdap vaccine do not have any problems with it. Mild problems following Tdap: (Did not interfere with activities)  Pain where the shot was given (about  3 in 4 adolescents or 2 in 3 adults)  Redness or swelling where the shot was given (about 1 person in 5)  Mild fever of at least 100.4F (up to about 1 in 25 adolescents or 1 in 100 adults)  Headache (about 3 or 4 people in 10)  Tiredness (about 1 person in 3 or 4)  Nausea, vomiting, diarrhea, stomach ache (up to 1 in 4 adolescents or 1 in 10 adults)  Chills, sore joints (about 1 person in 10)  Body aches (about 1 person in 3 or 4)  Rash, swollen glands (uncommon)  Moderate problems following Tdap: (Interfered with activities, but did not require medical attention)  Pain where the shot was given (up to 1 in 5 or 6)  Redness or swelling where the shot was given (up to about 1 in 16 adolescents or 1 in 12 adults)  Fever over 102F (about 1 in 100 adolescents or 1 in 250 adults)  Headache (about 1 in 7 adolescents or 1 in 10 adults)  Nausea, vomiting, diarrhea, stomach ache (up to 1 or 3 people in 100)  Swelling of the entire arm where the shot was given (up to about 1 in 500).  Severe problems following Tdap: (Unable to perform usual activities; required medical attention)  Swelling, severe pain, bleeding and redness in the arm where the shot was given (rare).  Problems that could happen after any vaccine:  People sometimes faint after a medical procedure, including vaccination. Sitting or lying down for about 15 minutes can help prevent fainting, and injuries caused by a fall. Tell your doctor if you feel dizzy, or have vision changes or ringing in the ears.  Some people get severe pain in the shoulder and have difficulty moving the arm where a shot was given. This happens very rarely.  Any medication can cause a severe allergic reaction. Such reactions from a vaccine are very rare, estimated at fewer than 1 in a million doses, and would happen within a few minutes to a few hours after the vaccination. As with any medicine, there is a very remote chance of a vaccine  causing a serious injury or death. The safety of vaccines is always being monitored. For more information, visit: www.cdc.gov/vaccinesafety/ 5. What if there is a serious problem? What should I look for? Look for anything that concerns you, such as signs of a severe allergic reaction, very high fever, or unusual behavior. Signs of a severe allergic reaction can include hives, swelling of the face and throat, difficulty breathing, a fast heartbeat, dizziness, and weakness. These would usually start a few minutes to a few hours after the vaccination. What should I do?  If you think it is a severe allergic reaction or other emergency that can't wait, call 9-1-1 or get the person to the nearest hospital. Otherwise, call your doctor.  Afterward, the reaction should be reported to the Vaccine Adverse Event Reporting System (VAERS). Your doctor might file this report, or you can do it yourself through the VAERS web site at www.vaers.hhs.gov, or by calling 1-800-822-7967. ? VAERS does not give medical advice. 6. The National Vaccine Injury Compensation Program The National   Vaccine Injury Compensation Program (VICP) is a federal program that was created to compensate people who may have been injured by certain vaccines. Persons who believe they may have been injured by a vaccine can learn about the program and about filing a claim by calling 1-800-338-2382 or visiting the VICP website at www.hrsa.gov/vaccinecompensation. There is a time limit to file a claim for compensation. 7. How can I learn more?  Ask your doctor. He or she can give you the vaccine package insert or suggest other sources of information.  Call your local or state health department.  Contact the Centers for Disease Control and Prevention (CDC): ? Call 1-800-232-4636 (1-800-CDC-INFO) or ? Visit CDC's website at www.cdc.gov/vaccines CDC Tdap Vaccine VIS (02/05/14) This information is not intended to replace advice given to you by your  health care provider. Make sure you discuss any questions you have with your health care provider. Document Released: 05/30/2012 Document Revised: 08/19/2016 Document Reviewed: 08/19/2016 Elsevier Interactive Patient Education  2017 [employer]  

## 2017-04-08 MED FILL — methIMAzole 10 MG TABS: 10 | 90 days supply | Qty: 90 | Fill #0

## 2017-04-28 DIAGNOSIS — E0500 Thyrotoxicosis with diffuse goiter without thyrotoxic crisis or storm: Secondary | ICD-10-CM | POA: Diagnosis not present

## 2017-06-06 DIAGNOSIS — E0500 Thyrotoxicosis with diffuse goiter without thyrotoxic crisis or storm: Secondary | ICD-10-CM | POA: Diagnosis not present

## 2017-08-05 ENCOUNTER — Telehealth: Payer: 59 | Admitting: Family

## 2017-08-05 DIAGNOSIS — J028 Acute pharyngitis due to other specified organisms: Secondary | ICD-10-CM

## 2017-08-05 DIAGNOSIS — B9689 Other specified bacterial agents as the cause of diseases classified elsewhere: Secondary | ICD-10-CM | POA: Diagnosis not present

## 2017-08-05 MED ORDER — BENZONATATE 100 MG PO CAPS: 100.0000 mg | ORAL_CAPSULE | Freq: Three times a day (TID) | ORAL | 0 refills | Status: DC | PRN

## 2017-08-05 MED ORDER — AZITHROMYCIN 250 MG PO TABS: ORAL_TABLET | ORAL | 0 refills | Status: DC

## 2017-08-05 NOTE — Progress Notes (Signed)
Thank you for the details you put in the comment boxes. Those details really help [place] take better care of you. The information below will say for cough but the treatment will be for sinus infection and also infection with cough.  We are sorry that you are not feeling well.  Here is how we plan to help!  Based on your presentation I believe you most likely have A cough due to bacteria.  When patients have a fever and a productive cough with a change in color or increased sputum production, we are concerned about bacterial bronchitis.  If left untreated it can progress to pneumonia.  If your symptoms do not improve with your treatment plan it is important that you contact your provider.   I have prescribed Azithromyin 250 mg: two tables now and then one tablet daily for 4 additonal days    In addition you may use A non-prescription cough medication called Mucinex DM: take 2 tablets every 12 hours. and A prescription cough medication called Tessalon Perles 100mg . You may take 1-2 capsules every 8 hours as needed for your cough.   From your responses in the eVisit questionnaire you describe inflammation in the upper respiratory tract which is causing a significant cough.  This is commonly called Bronchitis and has four common causes:    Allergies  Viral Infections  Acid Reflux  Bacterial Infection Allergies, viruses and acid reflux are treated by controlling symptoms or eliminating the cause. An example might be a cough caused by taking certain blood pressure medications. You stop the cough by changing the medication. Another example might be a cough caused by acid reflux. Controlling the reflux helps control the cough.  USE OF BRONCHODILATOR ("RESCUE") INHALERS: There is a risk from using your bronchodilator too frequently.  The risk is that over-reliance on a medication which only relaxes the muscles surrounding the breathing tubes can reduce the effectiveness of medications prescribed to reduce  swelling and congestion of the tubes themselves.  Although you feel brief relief from the bronchodilator inhaler, your asthma may actually be worsening with the tubes becoming more swollen and filled with mucus.  This can delay other crucial treatments, such as oral steroid medications. If you need to use a bronchodilator inhaler daily, several times per day, you should discuss this with your provider.  There are probably better treatments that could be used to keep your asthma under control.     HOME CARE . Only take medications as instructed by your medical team. . Complete the entire course of an antibiotic. . Drink plenty of fluids and get plenty of rest. . Avoid close contacts especially the very young and the elderly . Cover your mouth if you cough or cough into your sleeve. . Always remember to wash your hands . A steam or ultrasonic humidifier can help congestion.   GET HELP RIGHT AWAY IF: . You develop worsening fever. . You become short of breath . You cough up blood. . Your symptoms persist after you have completed your treatment plan MAKE SURE YOU   Understand these instructions.  Will watch your condition.  Will get help right away if you are not doing well or get worse.  Your e-visit answers were reviewed by a board certified advanced clinical practitioner to complete your personal care plan.  Depending on the condition, your plan could have included both over the counter or prescription medications. If there is a problem please reply  once you have received a response  from your provider. Your safety is important to [place].  If you have drug allergies check your prescription carefully.    You can use MyChart to ask questions about today's visit, request a non-urgent call back, or ask for a work or school excuse for 24 hours related to this e-Visit. If it has been greater than 24 hours you will need to follow up with your provider, or enter a new e-Visit to address those  concerns. You will get an e-mail in the next two days asking about your experience.  I hope that your e-visit has been valuable and will speed your recovery. Thank you for using e-visits.

## 2017-11-08 DIAGNOSIS — R635 Abnormal weight gain: Secondary | ICD-10-CM | POA: Diagnosis not present

## 2017-11-08 DIAGNOSIS — E0500 Thyrotoxicosis with diffuse goiter without thyrotoxic crisis or storm: Secondary | ICD-10-CM | POA: Diagnosis not present

## 2017-12-22 DIAGNOSIS — R202 Paresthesia of skin: Secondary | ICD-10-CM | POA: Diagnosis not present

## 2018-01-15 ENCOUNTER — Emergency Department
Admission: EM | Admit: 2018-01-15 | Discharge: 2018-01-15 | Discharge status: Absent without leave | Payer: 59 | Source: Home / Self Care

## 2018-05-08 ENCOUNTER — Telehealth: Payer: 59 | Admitting: Nurse Practitioner

## 2018-05-08 DIAGNOSIS — J0100 Acute maxillary sinusitis, unspecified: Secondary | ICD-10-CM | POA: Diagnosis not present

## 2018-05-08 MED ORDER — AMOXICILLIN-POT CLAVULANATE 875-125 MG PO TABS: 1.0000 | ORAL_TABLET | Freq: Two times a day (BID) | ORAL | 0 refills | Status: DC

## 2018-05-08 NOTE — Progress Notes (Signed)

## 2018-05-10 DIAGNOSIS — Z01419 Encounter for gynecological examination (general) (routine) without abnormal findings: Secondary | ICD-10-CM | POA: Diagnosis not present

## 2018-05-10 DIAGNOSIS — Z6833 Body mass index (BMI) 33.0-33.9, adult: Secondary | ICD-10-CM | POA: Diagnosis not present

## 2018-06-14 DIAGNOSIS — H52223 Regular astigmatism, bilateral: Secondary | ICD-10-CM | POA: Diagnosis not present

## 2018-07-25 DIAGNOSIS — E0500 Thyrotoxicosis with diffuse goiter without thyrotoxic crisis or storm: Secondary | ICD-10-CM | POA: Diagnosis not present

## 2018-07-26 ENCOUNTER — Other Ambulatory Visit (HOSPITAL_COMMUNITY): Payer: Self-pay | Admitting: Endocrinology

## 2018-07-26 DIAGNOSIS — E0500 Thyrotoxicosis with diffuse goiter without thyrotoxic crisis or storm: Secondary | ICD-10-CM

## 2018-08-01 ENCOUNTER — Encounter (HOSPITAL_COMMUNITY)
Admission: RE | Admit: 2018-08-01 | Discharge: 2018-08-01 | Disposition: A | Discharge status: Home / Self Care | Payer: 59 | Source: Ambulatory Visit | Attending: Endocrinology | Admitting: Endocrinology

## 2018-08-01 DIAGNOSIS — E0500 Thyrotoxicosis with diffuse goiter without thyrotoxic crisis or storm: Secondary | ICD-10-CM | POA: Insufficient documentation

## 2018-08-01 MED ORDER — SODIUM IODIDE I-123 7.4 MBQ CAPS
360.0000 | ORAL_CAPSULE | Freq: Once | ORAL | Status: AC
  Administered 2018-08-01: 360 via ORAL

## 2018-08-02 ENCOUNTER — Encounter (HOSPITAL_COMMUNITY)
Admission: RE | Admit: 2018-08-02 | Discharge: 2018-08-02 | Disposition: A | Discharge status: Home / Self Care | Payer: 59 | Source: Ambulatory Visit | Attending: Endocrinology | Admitting: Endocrinology

## 2018-08-02 DIAGNOSIS — E0500 Thyrotoxicosis with diffuse goiter without thyrotoxic crisis or storm: Secondary | ICD-10-CM | POA: Diagnosis not present

## 2018-08-07 MED FILL — methIMAzole 10 MG TABS: 10 | 30 days supply | Qty: 60 | Fill #0

## 2018-08-08 ENCOUNTER — Encounter (HOSPITAL_COMMUNITY): Payer: 59

## 2018-08-08 ENCOUNTER — Other Ambulatory Visit (HOSPITAL_COMMUNITY): Payer: 59

## 2018-08-09 ENCOUNTER — Other Ambulatory Visit (HOSPITAL_COMMUNITY): Payer: 59

## 2018-09-15 ENCOUNTER — Ambulatory Visit: Payer: Self-pay | Admitting: Surgery

## 2018-09-15 DIAGNOSIS — E0500 Thyrotoxicosis with diffuse goiter without thyrotoxic crisis or storm: Secondary | ICD-10-CM | POA: Diagnosis not present

## 2018-09-17 MED FILL — methIMAzole 10 MG TABS: 10 | 30 days supply | Qty: 60 | Fill #1

## 2018-10-05 DIAGNOSIS — E0500 Thyrotoxicosis with diffuse goiter without thyrotoxic crisis or storm: Secondary | ICD-10-CM | POA: Diagnosis not present

## 2018-10-05 DIAGNOSIS — R5383 Other fatigue: Secondary | ICD-10-CM | POA: Diagnosis not present

## 2018-10-06 ENCOUNTER — Encounter (HOSPITAL_COMMUNITY): Payer: Self-pay | Admitting: *Deleted

## 2018-10-09 DIAGNOSIS — E0500 Thyrotoxicosis with diffuse goiter without thyrotoxic crisis or storm: Secondary | ICD-10-CM | POA: Diagnosis not present

## 2018-10-09 DIAGNOSIS — E559 Vitamin D deficiency, unspecified: Secondary | ICD-10-CM | POA: Diagnosis not present

## 2018-10-09 MED FILL — ATENOLOL 25 MG TABLET: 25 | 30 days supply | Qty: 30 | Fill #0

## 2018-10-25 NOTE — Patient Instructions (Signed)
Donna Tucker  [birth year]    Your procedure is scheduled on:  11-02-2018    Report to [facility] Main  Entrance,  Report to admitting at  5:30 AM     Call this number if you have problems the morning of surgery [phone]       Remember: Do not eat food or drink liquids :After Midnight.                                       BRUSH YOUR TEETH MORNING OF SURGERY AND RINSE YOUR MOUTH OUT, NO CHEWING GUM CANDY OR MINTS.         Take these medicines the morning of surgery with A SIP OF WATER:  Methimazole (tapazole)                                    You may not have any metal on your body including hair pins and piercings              Do not wear jewelry, make-up, lotions, powders or perfumes, deodorant              Do not wear nail polish.  Do not shave  48 hours prior to surgery.         Do not bring valuables to the hospital. [place] IS NOT             RESPONSIBLE   FOR VALUABLES.  Contacts, dentures or bridgework may not be worn into surgery.  Leave suitcase in the car. After surgery it may be brought to your room.      Special Instructions: N/A            _____________________________________________________________________             [facility] - Preparing for Surgery Before surgery, you can play an important role.  Because skin is not sterile, your skin needs to be as free of germs as possible.  You can reduce the number of germs on your skin by washing with CHG (chlorahexidine gluconate) soap before surgery.  CHG is an antiseptic cleaner which kills germs and bonds with the skin to continue killing germs even after washing. Please DO NOT use if you have an allergy to CHG or antibacterial soaps.  If your skin becomes reddened/irritated stop using the CHG and inform your nurse when you arrive at Short Stay. Do not shave (including legs and underarms) for at least 48 hours prior to the first CHG shower.  You may shave  your face/neck. Please follow these instructions carefully:  1.  Shower with CHG Soap the night before surgery and the  morning of Surgery.  2.  If you choose to wash your hair, wash your hair first as usual with your  normal  shampoo.  3.  After you shampoo, rinse your hair and body thoroughly to remove the  shampoo.                            4.  Use CHG as you would any other liquid soap.  You can apply chg directly  to the skin and wash  Gently with a scrungie or clean washcloth.  5.  Apply the CHG Soap to your body ONLY FROM THE NECK DOWN.   Do not use on face/ open                           Wound or open sores. Avoid contact with eyes, ears mouth and genitals (private parts).                       Wash face,  Genitals (private parts) with your normal soap.             6.  Wash thoroughly, paying special attention to the area where your surgery  will be performed.  7.  Thoroughly rinse your body with warm water from the neck down.  8.  DO NOT shower/wash with your normal soap after using and rinsing off  the CHG Soap.             9.  Pat yourself dry with a clean towel.            10.  Wear clean pajamas.            11.  Place clean sheets on your bed the night of your first shower and do not  sleep with pets. Day of Surgery : Do not apply any lotions/deodorants the morning of surgery.  Please wear clean clothes to the hospital/surgery center.  FAILURE TO FOLLOW THESE INSTRUCTIONS MAY RESULT IN THE CANCELLATION OF YOUR SURGERY PATIENT SIGNATURE_________________________________  NURSE SIGNATURE__________________________________  ________________________________________________________________________

## 2018-10-26 ENCOUNTER — Encounter (HOSPITAL_COMMUNITY): Payer: Self-pay

## 2018-10-26 ENCOUNTER — Ambulatory Visit (HOSPITAL_COMMUNITY)
Admission: RE | Admit: 2018-10-26 | Discharge: 2018-10-26 | Disposition: A | Discharge status: Home / Self Care | Payer: 59 | Source: Ambulatory Visit | Attending: Anesthesiology | Admitting: Anesthesiology

## 2018-10-26 ENCOUNTER — Other Ambulatory Visit: Payer: Self-pay

## 2018-10-26 ENCOUNTER — Encounter (HOSPITAL_COMMUNITY)
Admission: RE | Admit: 2018-10-26 | Discharge: 2018-10-26 | Disposition: A | Discharge status: Home / Self Care | Payer: 59 | Source: Ambulatory Visit | Attending: Surgery | Admitting: Surgery

## 2018-10-26 DIAGNOSIS — Z01818 Encounter for other preprocedural examination: Secondary | ICD-10-CM

## 2018-10-26 HISTORY — DX: Tachycardia, unspecified: R00.0

## 2018-10-26 HISTORY — DX: Allergy to other foods: Z91.018

## 2018-10-26 HISTORY — DX: Presence of spectacles and contact lenses: Z97.3

## 2018-10-26 HISTORY — DX: Non-celiac gluten sensitivity: K90.41

## 2018-10-26 HISTORY — DX: Thyrotoxicosis with diffuse goiter without thyrotoxic crisis or storm: E05.00

## 2018-10-26 LAB — CBC
HEMATOCRIT: 46.8 % — AB (ref 36.0–46.0)
HEMOGLOBIN: 15.4 g/dL — AB (ref 12.0–15.0)
MCH: 30.1 pg (ref 26.0–34.0)
MCHC: 32.9 g/dL (ref 30.0–36.0)
MCV: 91.4 fL (ref 80.0–100.0)
Platelets: 286 10*3/uL (ref 150–400)
RBC: 5.12 MIL/uL — ABNORMAL HIGH (ref 3.87–5.11)
RDW: 13.1 % (ref 11.5–15.5)
WBC: 8 10*3/uL (ref 4.0–10.5)
nRBC: 0 % (ref 0.0–0.2)

## 2018-10-26 LAB — BASIC METABOLIC PANEL
Anion gap: 11 (ref 5–15)
BUN: 15 mg/dL (ref 6–20)
CHLORIDE: 103 mmol/L (ref 98–111)
CO2: 26 mmol/L (ref 22–32)
CREATININE: 0.83 mg/dL (ref 0.44–1.00)
Calcium: 9.3 mg/dL (ref 8.9–10.3)
GFR calc Af Amer: >60 mL/min (ref >60)
GFR calc non Af Amer: >60 mL/min (ref >60)
GLUCOSE: 88 mg/dL (ref 70–99)
POTASSIUM: 3.6 mmol/L (ref 3.5–5.1)
SODIUM: 140 mmol/L (ref 135–145)

## 2018-10-26 LAB — HCG, SERUM, QUALITATIVE: Preg, Serum: NEGATIVE

## 2018-10-26 MED ORDER — CHLORHEXIDINE GLUCONATE CLOTH 2 % EX PADS
6.0000 | MEDICATED_PAD | Freq: Once | CUTANEOUS | Status: DC
  Filled 2018-10-26: qty 6

## 2018-10-27 NOTE — Progress Notes (Signed)
Final CXR dated 10-26-2018 in epic.

## 2018-10-30 ENCOUNTER — Encounter (HOSPITAL_COMMUNITY): Payer: Self-pay | Admitting: Surgery

## 2018-10-30 NOTE — H&P (Signed)
General Surgery [facility]  Donna Tucker DOB: 1986-09-25 Married / Language: [place] / Race: White Female   History of Present Illness  The patient is a 32 year old female who presents with Graves disease.  CHIEF COMPLAINT: Graves' disease  Patient is referred by Dr. Dorisann FramesBindubal Balan for surgical evaluation and management of Graves' disease. Patient was originally diagnosed in 2012. She was treated initially with methimazole and beta-blockade. Patient recently changed endocrinologist after her previous endocrinologist retired. A decision was made to stop her methimazole. Initially her thyroid levels remained normal, but then she had recurrent hyperthyroidism. She actually experiencing some neck discomfort. She was restarted on methimazole 10 mg twice a day. She is not on beta blockade. She and her endocrinologist have discussed permanent management of her Graves' disease. They have discussed radioactive iodine ablation versus thyroidectomy. Patient has a strong family history of Graves' disease in multiple family members. Her mother has hypothyroidism. Patient has had no prior head or neck surgery. She and her husband have 2 children but are considering additional children and she does not want to have radioactive iodine as this will delay their opportunity for childbearing. Therefore she presents today for evaluation. Patient has also had some ophthalmologic changes related to her Graves' disease. Patient has been on methimazole now for approximately one month. She is due to have laboratory studies again in 2 weeks. She would like to proceed with thyroidectomy later this fall. Patient does note a mild tremor. She denies palpitations or tachycardia.   Past Surgical History Breast Biopsy  Right. Cesarean Section - 1  Oral Surgery   Diagnostic Studies History Colonoscopy  never Mammogram  never Pap Smear  1-5 years ago  Allergies  NSAIDs   Anaphylaxis, Hives, Swelling. Salicylates  Anaphylaxis, Hives, Swelling. Percocet *ANALGESICS - OPIOID*  Lactose Intolerance   Medication History methIMAzole (10MG  Tablet, Oral) Active. Medications Reconciled  Social History Alcohol use  Occasional alcohol use. Caffeine use  Tea. No drug use  Tobacco use  Never smoker.  Family History  Cerebrovascular Accident  Mother. Colon Polyps  Father. Diabetes Mellitus  Mother. Hypertension  Father. Thyroid problems  Mother.  Pregnancy / Birth History  Age at menarche  12 years. Gravida  3 Irregular periods  Length (months) of breastfeeding  3-6 Maternal age  32-25 Para  1  Other Problems Thyroid Disease    Review of Systems General Present- Weight Gain. Not Present- Appetite Loss, Chills, Fatigue, Fever, Night Sweats and Weight Loss. Skin Not Present- Change in Wart/Mole, Dryness, Hives, Jaundice, New Lesions, Non-Healing Wounds, Rash and Ulcer. HEENT Present- Wears glasses/contact lenses. Not Present- Earache, Hearing Loss, Hoarseness, Nose Bleed, Oral Ulcers, Ringing in the Ears, Seasonal Allergies, Sinus Pain, Sore Throat, Visual Disturbances and Yellow Eyes. Respiratory Not Present- Bloody sputum, Chronic Cough, Difficulty Breathing, Snoring and Wheezing. Breast Not Present- Breast Mass, Breast Pain, Nipple Discharge and Skin Changes. Cardiovascular Not Present- Chest Pain, Difficulty Breathing Lying Down, Leg Cramps, Palpitations, Rapid Heart Rate, Shortness of Breath and Swelling of Extremities. Gastrointestinal Not Present- Abdominal Pain, Bloating, Bloody Stool, Change in Bowel Habits, Chronic diarrhea, Constipation, Difficulty Swallowing, Excessive gas, Gets full quickly at meals, Hemorrhoids, Indigestion, Nausea, Rectal Pain and Vomiting. Female Genitourinary Not Present- Frequency, Nocturia, Painful Urination, Pelvic Pain and Urgency. Musculoskeletal Not Present- Back Pain, Joint Pain, Joint Stiffness,  Muscle Pain, Muscle Weakness and Swelling of Extremities. Neurological Not Present- Decreased Memory, Fainting, Headaches, Numbness, Seizures, Tingling, Tremor, Trouble walking and Weakness. Psychiatric Not  Present- Anxiety, Bipolar, Change in Sleep Pattern, Depression, Fearful and Frequent crying. Endocrine Not Present- Cold Intolerance, Excessive Hunger, Hair Changes, Heat Intolerance, Hot flashes and New Diabetes. Hematology Not Present- Blood Thinners, Easy Bruising, Excessive bleeding, Gland problems, HIV and Persistent Infections.  Vitals  Weight: 211.2 lb Height: 65in Body Surface Area: 2.02 m Body Mass Index: 35.15 kg/m  Temp.: 98.63F(Temporal)  Pulse: 93 (Regular)  BP: 122/72 (Sitting, Right Arm, Standard)  Physical Exam   See vital signs recorded above  GENERAL APPEARANCE Development: normal Nutritional status: normal Gross deformities: none  SKIN Rash, lesions, ulcers: none Induration, erythema: none Nodules: none palpable  EYES Conjunctiva and lids: normal Pupils: equal and reactive Iris: normal bilaterally  EARS, NOSE, MOUTH, THROAT External ears: no lesion or deformity External nose: no lesion or deformity Hearing: grossly normal Lips: no lesion or deformity Dentition: normal for age Oral mucosa: moist  NECK Symmetric: yes Trachea: midline Thyroid: Thyroid gland is mildly enlarged, symmetric, slightly firm, nontender. There are no dominant or discrete masses. There is no cervical lymphadenopathy.  CHEST Respiratory effort: normal Retraction or accessory muscle use: no Breath sounds: normal bilaterally Rales, rhonchi, wheeze: none  CARDIOVASCULAR Auscultation: regular rhythm, normal rate Murmurs: none Pulses: carotid and radial pulse 2+ palpable Lower extremity edema: none Lower extremity varicosities: none  MUSCULOSKELETAL Station and gait: normal Digits and nails: no clubbing or cyanosis Muscle strength: grossly normal all  extremities Range of motion: grossly normal all extremities Deformity: none  LYMPHATIC Cervical: none palpable Supraclavicular: none palpable  PSYCHIATRIC Oriented to person, place, and time: yes Mood and affect: normal for situation Judgment and insight: appropriate for situation    Assessment & Plan  GRAVES' DISEASE (E05.00)  Pt Education - Pamphlet Given - The Thyroid Book: discussed with patient and provided information.  Patient is referred by her endocrinologist for consideration for total thyroidectomy for management of Graves' disease. She is accompanied by her husband. They are presented with written literature on thyroid surgery to review at home.  Patient and her husband have considered the options of radioactive iodine treatment versus surgical removal of the thyroid gland for definitive management of Graves' disease. Patient has experienced through several family members with management of Graves' disease both by medical measures and through surgery. Today we discussed total thyroidectomy. We discussed the risk and benefits of the surgery including the risk of injury to recurrent laryngeal nerves and parathyroid glands. We discussed the location of the surgical incision. We discussed the hospital stay. We discussed a time out of work. We discussed the need for lifelong thyroid hormone replacement. Patient understands and wishes to proceed with surgery in the near future. We will make arrangements at a time convenient for the patient.  The risks and benefits of the procedure have been discussed at length with the patient. The patient understands the proposed procedure, potential alternative treatments, and the course of recovery to be expected. All of the patient's questions have been answered at this time. The patient wishes to proceed with surgery.   Darnell Level, MD [facility] Surgery Office: [phone]

## 2018-11-01 ENCOUNTER — Encounter (HOSPITAL_COMMUNITY): Payer: Self-pay | Admitting: Certified Registered"

## 2018-11-01 NOTE — Progress Notes (Signed)
Called and lvm for pt on her cell phone to inform her of time change of her surgery on 11-02-2018 from 0730 to 0830 and [facility]. Ask her to call back with any questions .

## 2018-11-01 NOTE — Anesthesia Preprocedure Evaluation (Addendum)
Anesthesia Evaluation  Patient identified by MRN, date of birth, ID band Patient awake    Reviewed: Allergy & Precautions, NPO status , Patient's Chart, lab work & pertinent test results  History of Anesthesia Complications Negative for: history of anesthetic complications  Airway Mallampati: II  TM Distance: >3 FB Neck ROM: Full    Dental no notable dental hx. (+) Teeth Intact, Dental Advisory Given   Pulmonary neg pulmonary ROS,    Pulmonary exam normal breath sounds clear to auscultation       Cardiovascular Pt. on home beta blockers Normal cardiovascular exam Rhythm:Regular Rate:Normal     Neuro/Psych negative neurological ROS     GI/Hepatic negative GI ROS, Neg liver ROS,   Endo/Other  Hyperthyroidism Grave's disease  Renal/GU negative Renal ROS     Musculoskeletal negative musculoskeletal ROS (+)   Abdominal   Peds  Hematology negative hematology ROS (+)   Anesthesia Other Findings Day of surgery medications reviewed with the patient.  Reproductive/Obstetrics                            Anesthesia Physical Anesthesia Plan  ASA: II  Anesthesia Plan: General   Post-op Pain Management:    Induction: Intravenous  PONV Risk Score and Plan: 3 and Treatment may vary due to age or medical condition, Ondansetron, Dexamethasone, Scopolamine patch - Pre-op and Midazolam  Airway Management Planned: Oral ETT  Additional Equipment:   Intra-op Plan:   Post-operative Plan: Extubation in OR  Informed Consent: I have reviewed the patients History and Physical, chart, labs and discussed the procedure including the risks, benefits and alternatives for the proposed anesthesia with the patient or authorized representative who has indicated his/her understanding and acceptance.   Dental advisory given  Plan Discussed with: CRNA  Anesthesia Plan Comments:        Anesthesia Quick  Evaluation

## 2018-11-02 ENCOUNTER — Encounter (HOSPITAL_COMMUNITY)
Admission: RE | Disposition: A | Discharge status: Home / Self Care | Payer: Self-pay | Source: Ambulatory Visit | Attending: Surgery

## 2018-11-02 ENCOUNTER — Encounter (HOSPITAL_COMMUNITY): Payer: Self-pay | Admitting: *Deleted

## 2018-11-02 ENCOUNTER — Ambulatory Visit (HOSPITAL_COMMUNITY): Payer: 59 | Admitting: Anesthesiology

## 2018-11-02 ENCOUNTER — Other Ambulatory Visit: Payer: Self-pay

## 2018-11-02 ENCOUNTER — Observation Stay (HOSPITAL_COMMUNITY)
Admission: RE | Admit: 2018-11-02 | Discharge: 2018-11-03 | Disposition: A | Discharge status: Home / Self Care | Payer: 59 | Source: Ambulatory Visit | Attending: Surgery | Admitting: Surgery

## 2018-11-02 DIAGNOSIS — E0590 Thyrotoxicosis, unspecified without thyrotoxic crisis or storm: Secondary | ICD-10-CM | POA: Diagnosis not present

## 2018-11-02 DIAGNOSIS — E0500 Thyrotoxicosis with diffuse goiter without thyrotoxic crisis or storm: Principal | ICD-10-CM | POA: Insufficient documentation

## 2018-11-02 DIAGNOSIS — D62 Acute posthemorrhagic anemia: Secondary | ICD-10-CM | POA: Diagnosis not present

## 2018-11-02 DIAGNOSIS — R251 Tremor, unspecified: Secondary | ICD-10-CM | POA: Diagnosis not present

## 2018-11-02 HISTORY — PX: THYROIDECTOMY: SHX17

## 2018-11-02 SURGERY — THYROIDECTOMY
Anesthesia: General

## 2018-11-02 MED ORDER — PROPOFOL 10 MG/ML IV BOLUS
INTRAVENOUS | Status: DC | PRN
  Administered 2018-11-02 (×2): 20 mg via INTRAVENOUS
  Administered 2018-11-02: 180 mg via INTRAVENOUS
  Administered 2018-11-02: 20 mg via INTRAVENOUS

## 2018-11-02 MED ORDER — LACTATED RINGERS IV SOLN
INTRAVENOUS | Status: DC | PRN
  Administered 2018-11-02: 08:00:00 via INTRAVENOUS

## 2018-11-02 MED ORDER — ONDANSETRON HCL 4 MG/2ML IJ SOLN
INTRAMUSCULAR | Status: DC | PRN
  Administered 2018-11-02: 4 mg via INTRAVENOUS

## 2018-11-02 MED ORDER — ACETAMINOPHEN 650 MG RE SUPP: 650.0000 mg | Freq: Four times a day (QID) | RECTAL | Status: DC | PRN

## 2018-11-02 MED ORDER — KCL IN DEXTROSE-NACL 20-5-0.45 MEQ/L-%-% IV SOLN
INTRAVENOUS | Status: DC
  Administered 2018-11-02: 15:00:00 via INTRAVENOUS
  Filled 2018-11-02: qty 1000

## 2018-11-02 MED ORDER — ONDANSETRON 4 MG PO TBDP: 4.0000 mg | ORAL_TABLET | Freq: Four times a day (QID) | ORAL | Status: DC | PRN

## 2018-11-02 MED ORDER — PHENYLEPHRINE HCL 10 MG/ML IJ SOLN: INTRAMUSCULAR | Status: DC | PRN

## 2018-11-02 MED ORDER — FENTANYL CITRATE (PF) 250 MCG/5ML IJ SOLN
INTRAMUSCULAR | Status: DC | PRN
  Administered 2018-11-02: 25 ug via INTRAVENOUS
  Administered 2018-11-02: 150 ug via INTRAVENOUS
  Administered 2018-11-02: 25 ug via INTRAVENOUS
  Administered 2018-11-02: 50 ug via INTRAVENOUS

## 2018-11-02 MED ORDER — FENTANYL CITRATE (PF) 100 MCG/2ML IJ SOLN
25.0000 ug | INTRAMUSCULAR | Status: DC | PRN
  Administered 2018-11-02: 50 ug via INTRAVENOUS

## 2018-11-02 MED ORDER — LACTATED RINGERS IV SOLN
INTRAVENOUS | Status: DC
  Administered 2018-11-02: 07:00:00 via INTRAVENOUS

## 2018-11-02 MED ORDER — 0.9 % SODIUM CHLORIDE (POUR BTL) OPTIME
TOPICAL | Status: DC | PRN
  Administered 2018-11-02: 1000 mL

## 2018-11-02 MED ORDER — FENTANYL CITRATE (PF) 250 MCG/5ML IJ SOLN
INTRAMUSCULAR | Status: AC
  Filled 2018-11-02: qty 5

## 2018-11-02 MED ORDER — TRAMADOL HCL 50 MG PO TABS
50.0000 mg | ORAL_TABLET | Freq: Four times a day (QID) | ORAL | Status: DC | PRN
  Administered 2018-11-02: 50 mg via ORAL

## 2018-11-02 MED ORDER — MIDAZOLAM HCL 2 MG/2ML IJ SOLN
INTRAMUSCULAR | Status: AC
  Filled 2018-11-02: qty 2

## 2018-11-02 MED ORDER — PROMETHAZINE HCL 25 MG/ML IJ SOLN: 6.2500 mg | INTRAMUSCULAR | Status: DC | PRN

## 2018-11-02 MED ORDER — SCOPOLAMINE 1 MG/3DAYS TD PT72
MEDICATED_PATCH | TRANSDERMAL | Status: DC | PRN
  Administered 2018-11-02: 1 via TRANSDERMAL

## 2018-11-02 MED ORDER — DEXAMETHASONE SODIUM PHOSPHATE 10 MG/ML IJ SOLN
INTRAMUSCULAR | Status: DC | PRN
  Administered 2018-11-02: 10 mg via INTRAVENOUS

## 2018-11-02 MED ORDER — FENTANYL CITRATE (PF) 100 MCG/2ML IJ SOLN
INTRAMUSCULAR | Status: AC
  Filled 2018-11-02: qty 2

## 2018-11-02 MED ORDER — PROPOFOL 10 MG/ML IV BOLUS
INTRAVENOUS | Status: AC
  Filled 2018-11-02: qty 20

## 2018-11-02 MED ORDER — TRAMADOL HCL 50 MG PO TABS
ORAL_TABLET | ORAL | Status: AC
  Filled 2018-11-02: qty 1

## 2018-11-02 MED ORDER — HYDROCODONE-ACETAMINOPHEN 5-325 MG PO TABS: 1.0000 | ORAL_TABLET | ORAL | Status: DC | PRN

## 2018-11-02 MED ORDER — PHENYLEPHRINE 40 MCG/ML (10ML) SYRINGE FOR IV PUSH (FOR BLOOD PRESSURE SUPPORT)
PREFILLED_SYRINGE | INTRAVENOUS | Status: DC | PRN
  Administered 2018-11-02: 180 ug via INTRAVENOUS
  Administered 2018-11-02: 80 ug via INTRAVENOUS

## 2018-11-02 MED ORDER — ACETAMINOPHEN 10 MG/ML IV SOLN
1000.0000 mg | Freq: Once | INTRAVENOUS | Status: DC | PRN
  Filled 2018-11-02: qty 100

## 2018-11-02 MED ORDER — ACETAMINOPHEN 10 MG/ML IV SOLN
INTRAVENOUS | Status: DC | PRN
  Administered 2018-11-02: 1000 mg via INTRAVENOUS

## 2018-11-02 MED ORDER — ONDANSETRON HCL 4 MG/2ML IJ SOLN: 4.0000 mg | Freq: Four times a day (QID) | INTRAMUSCULAR | Status: DC | PRN

## 2018-11-02 MED ORDER — ACETAMINOPHEN 325 MG PO TABS
650.0000 mg | ORAL_TABLET | Freq: Four times a day (QID) | ORAL | Status: DC | PRN
  Administered 2018-11-02 – 2018-11-03 (×2): 650 mg via ORAL
  Filled 2018-11-02 (×2): qty 2

## 2018-11-02 MED ORDER — SCOPOLAMINE 1 MG/3DAYS TD PT72
MEDICATED_PATCH | TRANSDERMAL | Status: AC
  Filled 2018-11-02: qty 1

## 2018-11-02 MED ORDER — FENTANYL CITRATE (PF) 100 MCG/2ML IJ SOLN
INTRAMUSCULAR | Status: AC
  Administered 2018-11-02: 50 ug via INTRAVENOUS
  Filled 2018-11-02: qty 2

## 2018-11-02 MED ORDER — MIDAZOLAM HCL 2 MG/2ML IJ SOLN
INTRAMUSCULAR | Status: DC | PRN
  Administered 2018-11-02 (×2): 1 mg via INTRAVENOUS

## 2018-11-02 MED ORDER — ACETAMINOPHEN-CODEINE #3 300-30 MG PO TABS: 1.0000 | ORAL_TABLET | Freq: Four times a day (QID) | ORAL | Status: DC | PRN

## 2018-11-02 MED ORDER — HYDROMORPHONE HCL 1 MG/ML IJ SOLN: 1.0000 mg | INTRAMUSCULAR | Status: DC | PRN

## 2018-11-02 MED ORDER — ACETAMINOPHEN 10 MG/ML IV SOLN: 1000.0000 mg | Freq: Once | INTRAVENOUS | Status: DC | PRN

## 2018-11-02 MED ORDER — SUGAMMADEX SODIUM 200 MG/2ML IV SOLN
INTRAVENOUS | Status: DC | PRN
  Administered 2018-11-02: 200 mg via INTRAVENOUS

## 2018-11-02 MED ORDER — LIDOCAINE 2% (20 MG/ML) 5 ML SYRINGE
INTRAMUSCULAR | Status: DC | PRN
  Administered 2018-11-02: 60 mg via INTRAVENOUS
  Administered 2018-11-02: 100 mg via INTRAVENOUS
  Administered 2018-11-02: 40 mg via INTRAVENOUS

## 2018-11-02 MED ORDER — FENTANYL CITRATE (PF) 100 MCG/2ML IJ SOLN
INTRAMUSCULAR | Status: DC | PRN
  Administered 2018-11-02: 25 ug via INTRAVENOUS
  Administered 2018-11-02: 50 ug via INTRAVENOUS
  Administered 2018-11-02: 25 ug via INTRAVENOUS
  Administered 2018-11-02 (×2): 50 ug via INTRAVENOUS

## 2018-11-02 MED ORDER — ATENOLOL 25 MG PO TABS: 25.0000 mg | ORAL_TABLET | Freq: Every day | ORAL | Status: DC

## 2018-11-02 MED ORDER — CEFAZOLIN SODIUM-DEXTROSE 2-4 GM/100ML-% IV SOLN
2.0000 g | INTRAVENOUS | Status: AC
  Administered 2018-11-02: 2 g via INTRAVENOUS
  Filled 2018-11-02: qty 100

## 2018-11-02 MED ORDER — ROCURONIUM BROMIDE 10 MG/ML (PF) SYRINGE
PREFILLED_SYRINGE | INTRAVENOUS | Status: DC | PRN
  Administered 2018-11-02: 60 mg via INTRAVENOUS

## 2018-11-02 MED ORDER — FENTANYL CITRATE (PF) 100 MCG/2ML IJ SOLN: 25.0000 ug | INTRAMUSCULAR | Status: DC | PRN

## 2018-11-02 SURGICAL SUPPLY — 37 items
ATTRACTOMAT 16X20 MAGNETIC DRP (DRAPES) ×3 IMPLANT
BLADE SURG 15 STRL LF DISP TIS (BLADE) ×1 IMPLANT
BLADE SURG 15 STRL SS (BLADE) ×2
CHLORAPREP W/TINT 26ML (MISCELLANEOUS) ×3 IMPLANT
CLIP VESOCCLUDE MED 6/CT (CLIP) ×6 IMPLANT
CLIP VESOCCLUDE SM WIDE 6/CT (CLIP) ×9 IMPLANT
CLOSURE WOUND 1/2 X4 (GAUZE/BANDAGES/DRESSINGS) ×1
COVER SURGICAL LIGHT HANDLE (MISCELLANEOUS) ×3 IMPLANT
COVER WAND RF STERILE (DRAPES) ×3 IMPLANT
DERMABOND ADVANCED (GAUZE/BANDAGES/DRESSINGS) ×2
DERMABOND ADVANCED .7 DNX12 (GAUZE/BANDAGES/DRESSINGS) ×1 IMPLANT
DISSECTOR ROUND CHERRY 3/8 STR (MISCELLANEOUS) IMPLANT
DRAPE LAPAROTOMY T 98X78 PEDS (DRAPES) ×3 IMPLANT
ELECT PENCIL ROCKER SW 15FT (MISCELLANEOUS) ×3 IMPLANT
ELECT REM PT RETURN 15FT ADLT (MISCELLANEOUS) ×3 IMPLANT
GAUZE 4X4 16PLY RFD (DISPOSABLE) ×3 IMPLANT
GAUZE SPONGE 4X4 12PLY STRL (GAUZE/BANDAGES/DRESSINGS) IMPLANT
GLOVE SURG ORTHO 8.0 STRL STRW (GLOVE) ×3 IMPLANT
GOWN STRL REUS W/TWL XL LVL3 (GOWN DISPOSABLE) ×6 IMPLANT
HEMOSTAT SURGICEL 2X4 FIBR (HEMOSTASIS) ×3 IMPLANT
ILLUMINATOR WAVEGUIDE N/F (MISCELLANEOUS) IMPLANT
KIT BASIN OR (CUSTOM PROCEDURE TRAY) ×3 IMPLANT
LIGHT WAVEGUIDE WIDE FLAT (MISCELLANEOUS) IMPLANT
PACK BASIC VI WITH GOWN DISP (CUSTOM PROCEDURE TRAY) ×3 IMPLANT
POWDER SURGICEL 3.0 GRAM (HEMOSTASIS) IMPLANT
SHEARS HARMONIC 9CM CVD (BLADE) ×3 IMPLANT
STRIP CLOSURE SKIN 1/2X4 (GAUZE/BANDAGES/DRESSINGS) ×2 IMPLANT
SUT MNCRL AB 4-0 PS2 18 (SUTURE) ×3 IMPLANT
SUT SILK 2 0 (SUTURE)
SUT SILK 2-0 18XBRD TIE 12 (SUTURE) IMPLANT
SUT SILK 3 0 (SUTURE)
SUT SILK 3-0 18XBRD TIE 12 (SUTURE) IMPLANT
SUT VIC AB 3-0 SH 18 (SUTURE) ×6 IMPLANT
SYR BULB IRRIGATION 50ML (SYRINGE) ×3 IMPLANT
TOWEL OR 17X26 10 PK STRL BLUE (TOWEL DISPOSABLE) ×3 IMPLANT
TOWEL OR NON WOVEN STRL DISP B (DISPOSABLE) ×3 IMPLANT
YANKAUER SUCT BULB TIP 10FT TU (MISCELLANEOUS) ×3 IMPLANT

## 2018-11-02 NOTE — Transfer of Care (Signed)
Immediate Anesthesia Transfer of Care Note  Patient: Donna Tucker  Procedure(s) Performed: TOTAL THYROIDECTOMY (N/A )  Patient Location: PACU  Anesthesia Type:General  Level of Consciousness: awake and alert   Airway & Oxygen Therapy: Patient Spontanous Breathing and Patient connected to face mask oxygen  Post-op Assessment: Post -op Vital signs reviewed and stable  Post vital signs: Reviewed and stable  Last Vitals:  Vitals Value Taken Time  BP 145/96 11/02/2018 10:24 AM  Temp    Pulse 73 11/02/2018 10:26 AM  Resp 16 11/02/2018 10:26 AM  SpO2 100 % 11/02/2018 10:26 AM  Vitals shown include unvalidated device data.  Last Pain:  Vitals:   11/02/18 0700  TempSrc:   PainSc: 0-No pain      Patients Stated Pain Goal: 4 (11/02/18 0700)  Complications: No apparent anesthesia complications

## 2018-11-02 NOTE — Anesthesia Procedure Notes (Signed)
Date/Time: 11/02/2018 10:18 AM Performed by: Minerva EndsMirarchi, Keylen Uzelac M, CRNA Oxygen Delivery Method: Simple face mask Placement Confirmation: positive ETCO2 and breath sounds checked- equal and bilateral Dental Injury: Teeth and Oropharynx as per pre-operative assessment

## 2018-11-02 NOTE — Anesthesia Postprocedure Evaluation (Signed)
Anesthesia Post Note  Patient: Donna Tucker  Procedure(s) Performed: TOTAL THYROIDECTOMY (N/A )     Patient location during evaluation: PACU Anesthesia Type: General Level of consciousness: awake and alert Pain management: pain level controlled Vital Signs Assessment: post-procedure vital signs reviewed and stable Respiratory status: spontaneous breathing, nonlabored ventilation and respiratory function stable Cardiovascular status: blood pressure returned to baseline and stable Postop Assessment: no apparent nausea or vomiting Anesthetic complications: no    Last Vitals:  Vitals:   11/02/18 1045 11/02/18 1100  BP: (!) 152/96 (!) 133/100  Pulse: (!) 54 70  Resp: 14 20  Temp:    SpO2: 100% 98%    Last Pain:  Vitals:   11/02/18 1100  TempSrc:   PainSc: 3                  Kaylyn LayerKathryn E Darnita Woodrum

## 2018-11-02 NOTE — Op Note (Signed)
Procedure Note  Pre-operative Diagnosis:  Graves' disease  Post-operative Diagnosis:  same  Surgeon:  Darnell Levelodd Lovenia Debruler, MD  Assistant:  Elna Breslowraci Pozil, RN   Procedure:  Total thyroidectomy  Anesthesia:  General  Estimated Blood Loss:  minimal  Drains: none         Specimen: thyroid to pathology  Indications:  Patient is referred by Dr. Dorisann FramesBindubal Balan for surgical evaluation and management of Graves' disease. Patient was originally diagnosed in 2012. She was treated initially with methimazole and beta-blockade. Patient recently changed endocrinologist after her previous endocrinologist retired. A decision was made to stop her methimazole. Initially her thyroid levels remained normal, but then she had recurrent hyperthyroidism. She actually experiencing some neck discomfort. She was restarted on methimazole 10 mg twice a day. She is not on beta blockade. She and her endocrinologist have discussed permanent management of her Graves' disease. They have discussed radioactive iodine ablation versus thyroidectomy. Patient has a strong family history of Graves' disease in multiple family members. Her mother has hypothyroidism. Patient has had no prior head or neck surgery. She and her husband have 2 children but are considering additional children and she does not want to have radioactive iodine as this will delay their opportunity for childbearing. Therefore she presents today for evaluation. Patient has also had some ophthalmologic changes related to her Graves' disease. Patient has been on methimazole now for approximately one month. She is due to have laboratory studies again in 2 weeks. She would like to proceed with thyroidectomy.  Procedure Details: Procedure was done in OR #5 at [facility].  The patient was brought to the operating room and placed in a supine position on the operating room table.  Following administration of general anesthesia, the patient was  positioned and then prepped and draped in the usual aseptic fashion.  After ascertaining that an adequate level of anesthesia had been achieved, a Kocher incision was made with #15 blade.  Dissection was carried through subcutaneous tissues and platysma. Hemostasis was achieved with the electrocautery.  Skin flaps were elevated cephalad and caudad from the thyroid notch to the sternal notch.  The Mahorner self-retaining retractor was placed for exposure.  Strap muscles were incised in the midline and dissection was begun on the left side.  Strap muscles were reflected laterally.  Left thyroid lobe was normal in size without nodules.  The left lobe was gently mobilized with blunt dissection.  Superior pole vessels were dissected out and divided individually between small and medium Ligaclips with the Harmonic scalpel.  The thyroid lobe was rolled anteriorly.  Branches of the inferior thyroid artery were divided between small Ligaclips with the Harmonic scalpel.  Inferior venous tributaries were divided between Ligaclips.  Both the superior and inferior parathyroid glands were identified and preserved on their vascular pedicles.  The recurrent laryngeal nerve was identified and preserved along its course.  The ligament of Allyson SabalBerry was released with the electrocautery and the gland was mobilized onto the anterior trachea. Isthmus was mobilized across the midline.  There was no pyramidal lobe present.  Dry pack was placed in the left neck.  Next, the right thyroid lobe was gently mobilized with blunt dissection.  Right thyroid lobe was normal in size without nodules.  Superior pole vessels were dissected out and divided between small and medium Ligaclips with the Harmonic scalpel.  Superior parathyroid was identified and preserved.  Inferior venous tributaries were divided between medium Ligaclips with the Harmonic scalpel.  The right thyroid  lobe was rolled anteriorly and the branches of the inferior thyroid artery  divided between small Ligaclips.  The right recurrent laryngeal nerve was identified and preserved along its course.  The ligament of Allyson Sabal was released with the electrocautery.  The right thyroid lobe was mobilized onto the anterior trachea and the remainder of the thyroid was dissected off the anterior trachea and the thyroid was completely excised.  A suture was used to mark the left lobe. The entire thyroid gland was submitted to pathology for review.  The neck was irrigated with warm saline.  Fibrillar was placed throughout the operative field.  Strap muscles were reapproximated in the midline with interrupted 3-0 Vicryl sutures.  Platysma was closed with interrupted 3-0 Vicryl sutures.  Skin was closed with a running 4-0 Monocryl subcuticular suture.  Wound was washed and dried and Dermabond was applied.  The patient was awakened from anesthesia and brought to the recovery room.  The patient tolerated the procedure well.   Darnell Level, MD [facility] Office: [phone]

## 2018-11-02 NOTE — Anesthesia Procedure Notes (Signed)
Procedure Name: Intubation Date/Time: 11/02/2018 8:43 AM Performed by: Minerva EndsMirarchi, Xadrian [place] M, CRNA Pre-anesthesia Checklist: Patient identified, Emergency Drugs available, Suction available and Patient being monitored Patient Re-evaluated:Patient Re-evaluated prior to induction Oxygen Delivery Method: Circle System Utilized Preoxygenation: Pre-oxygenation with 100% oxygen Induction Type: IV induction Ventilation: Mask ventilation without difficulty Laryngoscope Size: Miller and 2 Grade View: Grade I Tube type: Oral Number of attempts: 1 Airway Equipment and Method: Stylet Placement Confirmation: ETT inserted through vocal cords under direct vision,  positive ETCO2 and breath sounds checked- equal and bilateral Secured at: 22 cm Tube secured with: Tape Dental Injury: Teeth and Oropharynx as per pre-operative assessment  Comments: Smooth IV induction-- Howze--- intubation --- AM CRNA atraumatic-- teeth and mouth as preop-- dental advisory given in preop--  bilat BS Howze

## 2018-11-02 NOTE — Interval H&P Note (Signed)
History and Physical Interval Note:  11/02/2018 8:19 AM  Donna Tucker  has presented today for surgery, with the diagnosis of Grave's disease. The various methods of treatment have been discussed with the patient and family. After consideration of risks, benefits and other options for treatment, the patient has consented to    Procedure(s): TOTAL THYROIDECTOMY (N/A) as a surgical intervention .    The patient's history has been reviewed, patient examined, no change in status, stable for surgery.  I have reviewed the patient's chart and labs.  Questions were answered to the patient's satisfaction.    Darnell Levelodd Abu Heavin, MD [facility] Office: [phone]   [name]

## 2018-11-03 ENCOUNTER — Encounter (HOSPITAL_COMMUNITY): Payer: Self-pay | Admitting: Surgery

## 2018-11-03 DIAGNOSIS — E0500 Thyrotoxicosis with diffuse goiter without thyrotoxic crisis or storm: Secondary | ICD-10-CM | POA: Diagnosis not present

## 2018-11-03 DIAGNOSIS — R251 Tremor, unspecified: Secondary | ICD-10-CM | POA: Diagnosis not present

## 2018-11-03 LAB — BASIC METABOLIC PANEL
Anion gap: 8 (ref 5–15)
BUN: 9 mg/dL (ref 6–20)
CALCIUM: 9.2 mg/dL (ref 8.9–10.3)
CHLORIDE: 106 mmol/L (ref 98–111)
CO2: 26 mmol/L (ref 22–32)
CREATININE: 0.79 mg/dL (ref 0.44–1.00)
Glucose, Bld: 104 mg/dL — ABNORMAL HIGH (ref 70–99)
Potassium: 3.9 mmol/L (ref 3.5–5.1)
Sodium: 140 mmol/L (ref 135–145)

## 2018-11-03 MED ORDER — CALCIUM CARBONATE ANTACID 500 MG PO CHEW: 2.0000 | CHEWABLE_TABLET | Freq: Two times a day (BID) | ORAL | 1 refills | Status: DC

## 2018-11-03 MED ORDER — TRAMADOL HCL 50 MG PO TABS: 50.0000 mg | ORAL_TABLET | Freq: Four times a day (QID) | ORAL | 0 refills | Status: DC | PRN

## 2018-11-03 NOTE — Discharge Summary (Signed)
Physician Discharge Summary [facility]  Patient ID: Donna Tucker MRN: [id number] DOB/AGE: 06/30/1986 32 y.o.  Admit date: 11/02/2018 Discharge date: 11/03/2018  Admission Diagnoses:  Graves' disease  Discharge Diagnoses:  Principal Problem:   Graves disease   Discharged Condition: good  Hospital Course: Patient was admitted for observation following thyroid surgery.  Post op course was uncomplicated.  Pain was well controlled.  Tolerated diet.  Post op calcium level on morning following surgery was 9.2 mg/dl.  Patient was prepared for discharge home on POD#1.  Consults: None  Treatments: surgery: total thyroidectomy  Discharge Exam: Blood pressure 120/73, pulse (!) 54, temperature 98.4 F (36.9 C), temperature source Oral, resp. rate 16, height 5\' 4"  (1.626 m), weight 97.1 kg, SpO2 98 %. HEENT - clear Neck - wound dry and intact; voice normal; minimal STS; Dermabond in place Chest - clear bilaterally Cor - RRR   Disposition: Home  Discharge Instructions    Diet - low sodium heart healthy   Complete by:  As directed    Discharge instructions   Complete by:  As directed    [facility] PARATHYROID SURGERY:  POST-OP INSTRUCTIONS  Always review your discharge instruction sheet from the facility where your surgery was performed.  A prescription for pain medication may be given to you upon discharge.  Take your pain medication as prescribed.  If narcotic pain medicine is not needed, then you may take acetaminophen (Tylenol) or ibuprofen (Advil) as needed.  Take your usually prescribed medications unless otherwise directed.  If you need a refill on your pain medication, please contact our office during regular business hours.  Prescriptions cannot be processed by our office after 5 pm or on weekends.  Start with a light diet upon arrival home, such as soup and crackers or toast.  Be sure to drink plenty of fluids  daily.  Resume your normal diet the day after surgery.  Most patients will experience some swelling and bruising on the chest and neck area.  Ice packs will help.  Swelling and bruising can take several days to resolve.   It is common to experience some constipation after surgery.  Increasing fluid intake and taking a stool softener (Colace) will usually help or prevent this problem.  A mild laxative (Milk of Magnesia or Miralax) should be taken according to package directions if there has been no bowel movement after 48 hours.  You have steri-strips and a gauze dressing over your incision.  You may remove the gauze bandage on the second day after surgery, and you may shower at that time.  Leave your steri-strips (small skin tapes) in place directly over the incision.  These strips should remain on the skin for 5-7 days and then be removed.  You may get them wet in the shower and pat them dry.  You may resume regular (light) daily activities beginning the next day (such as daily self-care, walking, climbing stairs) gradually increasing activities as tolerated.  You may have sexual intercourse when it is comfortable.  Refrain from any heavy lifting or straining until approved by your doctor.  You may drive when you no longer are taking prescription pain medication, you can comfortably wear a seatbelt, and you can safely maneuver your car and apply brakes.  You should see your doctor in the office for a follow-up appointment approximately three weeks after your surgery.  Make sure that you call for this appointment within a  day or two after you arrive home to insure a convenient appointment time.  WHEN TO CALL YOUR DOCTOR: -- Fever greater than 101.5 -- Inability to urinate -- Nausea and/or vomiting - persistent -- Extreme swelling or bruising -- Continued bleeding from incision -- Increased pain, redness, or drainage from the incision -- Difficulty swallowing or breathing -- Muscle cramping or  spasms -- Numbness or tingling in hands or around lips  The clinic staff is available to answer your questions during regular business hours.  Please don't hesitate to call and ask to speak to one of the nurses if you have concerns.  Darnell Level, MD [facility] Office: [phone]   Increase activity slowly   Complete by:  As directed    No dressing needed   Complete by:  As directed       Follow-up Information    Darnell Level, MD. Schedule an appointment as soon as possible for a visit in 3 week(s).   Specialty:  General Surgery Contact information: [address] [place] [address] [phone]        Dorisann Frames, MD. Schedule an appointment as soon as possible for a visit in 3 week(s).   Specialty:  Endocrinology Contact information: [address] [place] [address] [phone]           Velora Heckler, MD, [facility] Office: [phone]   Signed: Velora Heckler 11/03/2018, 9:16 AM

## 2018-11-04 NOTE — Progress Notes (Signed)
Please contact patient and notify of benign pathology results.  Loletha Bertini M. Val Farnam, MD, FACS [facility] Office: [phone]   

## 2018-11-07 MED FILL — methIMAzole 10 MG TABS: 10 | 30 days supply | Qty: 60 | Fill #0

## 2018-11-16 DIAGNOSIS — E0500 Thyrotoxicosis with diffuse goiter without thyrotoxic crisis or storm: Secondary | ICD-10-CM | POA: Diagnosis not present

## 2018-11-22 MED FILL — methIMAzole 10 MG TABS: 10 | 2 days supply | Qty: 4 | Fill #0

## 2018-11-23 DIAGNOSIS — E0500 Thyrotoxicosis with diffuse goiter without thyrotoxic crisis or storm: Secondary | ICD-10-CM | POA: Diagnosis not present

## 2018-11-27 MED FILL — LEVOTHYROXINE 75 MCG TABLET: 75 | 90 days supply | Qty: 90 | Fill #0

## 2019-01-09 DIAGNOSIS — E0500 Thyrotoxicosis with diffuse goiter without thyrotoxic crisis or storm: Secondary | ICD-10-CM | POA: Diagnosis not present

## 2019-01-15 DIAGNOSIS — E0500 Thyrotoxicosis with diffuse goiter without thyrotoxic crisis or storm: Secondary | ICD-10-CM | POA: Diagnosis not present

## 2019-01-15 DIAGNOSIS — E890 Postprocedural hypothyroidism: Secondary | ICD-10-CM | POA: Diagnosis not present

## 2019-01-15 MED FILL — LEVOTHYROXINE 100 MCG TAB: 100 | 30 days supply | Qty: 30 | Fill #0

## 2019-02-09 MED FILL — LEVOTHYROXINE 100 MCG TAB: 100 | 30 days supply | Qty: 30 | Fill #1

## 2019-02-26 DIAGNOSIS — E0500 Thyrotoxicosis with diffuse goiter without thyrotoxic crisis or storm: Secondary | ICD-10-CM | POA: Diagnosis not present

## 2019-02-28 MED FILL — LEVOTHYROXINE 125 MCG TABLE: 125 | 90 days supply | Qty: 90 | Fill #0

## 2019-04-16 DIAGNOSIS — E0500 Thyrotoxicosis with diffuse goiter without thyrotoxic crisis or storm: Secondary | ICD-10-CM | POA: Diagnosis not present

## 2019-05-25 DIAGNOSIS — O09291 Supervision of pregnancy with other poor reproductive or obstetric history, first trimester: Secondary | ICD-10-CM | POA: Diagnosis not present

## 2019-05-25 DIAGNOSIS — Z3A01 Less than 8 weeks gestation of pregnancy: Secondary | ICD-10-CM | POA: Diagnosis not present

## 2019-05-25 DIAGNOSIS — Z3200 Encounter for pregnancy test, result unknown: Secondary | ICD-10-CM | POA: Diagnosis not present

## 2019-05-25 MED FILL — LEVOTHYROXINE 125 MCG TABLE: 125 | 90 days supply | Qty: 90 | Fill #0

## 2019-05-28 DIAGNOSIS — E890 Postprocedural hypothyroidism: Secondary | ICD-10-CM | POA: Diagnosis not present

## 2019-05-29 DIAGNOSIS — Z3A01 Less than 8 weeks gestation of pregnancy: Secondary | ICD-10-CM | POA: Diagnosis not present

## 2019-05-29 DIAGNOSIS — O09291 Supervision of pregnancy with other poor reproductive or obstetric history, first trimester: Secondary | ICD-10-CM | POA: Diagnosis not present

## 2019-05-31 DIAGNOSIS — Z3A01 Less than 8 weeks gestation of pregnancy: Secondary | ICD-10-CM | POA: Diagnosis not present

## 2019-05-31 DIAGNOSIS — O09291 Supervision of pregnancy with other poor reproductive or obstetric history, first trimester: Secondary | ICD-10-CM | POA: Diagnosis not present

## 2019-06-13 DIAGNOSIS — Z3201 Encounter for pregnancy test, result positive: Secondary | ICD-10-CM | POA: Diagnosis not present

## 2019-06-25 DIAGNOSIS — O09291 Supervision of pregnancy with other poor reproductive or obstetric history, first trimester: Secondary | ICD-10-CM | POA: Diagnosis not present

## 2019-06-25 DIAGNOSIS — Z3689 Encounter for other specified antenatal screening: Secondary | ICD-10-CM | POA: Diagnosis not present

## 2019-06-25 LAB — OB RESULTS CONSOLE RPR: RPR: NONREACTIVE

## 2019-06-25 LAB — OB RESULTS CONSOLE ANTIBODY SCREEN: Antibody Screen: NEGATIVE

## 2019-06-25 LAB — OB RESULTS CONSOLE RUBELLA ANTIBODY, IGM: Rubella: IMMUNE

## 2019-06-25 LAB — OB RESULTS CONSOLE HIV ANTIBODY (ROUTINE TESTING): HIV: NONREACTIVE

## 2019-06-25 LAB — OB RESULTS CONSOLE ABO/RH: RH Type: POSITIVE

## 2019-06-25 LAB — OB RESULTS CONSOLE HEPATITIS B SURFACE ANTIGEN: Hepatitis B Surface Ag: NEGATIVE

## 2019-06-25 LAB — OB RESULTS CONSOLE GC/CHLAMYDIA
Chlamydia: NEGATIVE
Gonorrhea: NEGATIVE

## 2019-06-25 LAB — HM HIV SCREENING LAB: HM HIV Screening: NEGATIVE

## 2019-07-13 DIAGNOSIS — E0500 Thyrotoxicosis with diffuse goiter without thyrotoxic crisis or storm: Secondary | ICD-10-CM | POA: Diagnosis not present

## 2019-07-13 DIAGNOSIS — E890 Postprocedural hypothyroidism: Secondary | ICD-10-CM | POA: Diagnosis not present

## 2019-07-16 DIAGNOSIS — Z3689 Encounter for other specified antenatal screening: Secondary | ICD-10-CM | POA: Diagnosis not present

## 2019-07-16 DIAGNOSIS — E890 Postprocedural hypothyroidism: Secondary | ICD-10-CM | POA: Diagnosis not present

## 2019-07-16 DIAGNOSIS — O09291 Supervision of pregnancy with other poor reproductive or obstetric history, first trimester: Secondary | ICD-10-CM | POA: Diagnosis not present

## 2019-07-16 DIAGNOSIS — Z3682 Encounter for antenatal screening for nuchal translucency: Secondary | ICD-10-CM | POA: Diagnosis not present

## 2019-07-16 MED FILL — LEVOTHYROXINE 137 MCG TABLE: 137 | 30 days supply | Qty: 30 | Fill #0

## 2019-08-01 DIAGNOSIS — Z3682 Encounter for antenatal screening for nuchal translucency: Secondary | ICD-10-CM | POA: Diagnosis not present

## 2019-08-08 ENCOUNTER — Other Ambulatory Visit: Payer: Self-pay

## 2019-08-08 DIAGNOSIS — Z20822 Contact with and (suspected) exposure to covid-19: Secondary | ICD-10-CM

## 2019-08-09 LAB — NOVEL CORONAVIRUS, NAA: SARS-CoV-2, NAA: NOT DETECTED

## 2019-08-13 DIAGNOSIS — E890 Postprocedural hypothyroidism: Secondary | ICD-10-CM | POA: Diagnosis not present

## 2019-08-14 MED FILL — LEVOTHYROXINE 150 MCG TAB: 150 | 30 days supply | Qty: 30 | Fill #0

## 2019-08-17 ENCOUNTER — Other Ambulatory Visit: Payer: Self-pay

## 2019-08-17 ENCOUNTER — Encounter: Payer: 59 | Attending: Obstetrics and Gynecology | Admitting: Dietician

## 2019-08-17 DIAGNOSIS — Z713 Dietary counseling and surveillance: Secondary | ICD-10-CM | POA: Insufficient documentation

## 2019-08-17 DIAGNOSIS — E669 Obesity, unspecified: Secondary | ICD-10-CM | POA: Insufficient documentation

## 2019-08-17 DIAGNOSIS — E0500 Thyrotoxicosis with diffuse goiter without thyrotoxic crisis or storm: Secondary | ICD-10-CM | POA: Diagnosis not present

## 2019-08-17 NOTE — Patient Instructions (Signed)
Continue to stay active daily as allowed by your MD.  Carbohydrate recommendations for pregnancy is a minimum of 170 grams spread throughout the day. Each meal and snack should contain carbohydrates and a small portion of protein. Listen to your body.  Eat more if hungry.  Focus on foods rich in fiber and those with a lower caloric density- non starchy vegetables. Eat more unprocessed foods such as brown rice, sweet potatoes, legumes, fresh fruits and vegetables and lean meat and fewer processed foods.

## 2019-08-17 NOTE — Progress Notes (Addendum)
Medical Nutrition Therapy:  Appt start time: 1100 end time:  1200.   Assessment:  Primary concerns today: She would like to learn healthy eating to prevent excessive weight gain during pregnancy and and have a healthy pregnancy She sees Dr. Chalmers Cater and Dr. Ronita Hipps. Marland Kitchen  History includes:  Grave's disease about 8 years.  Weiged 195 lbs  last summer, thyroid removed in November 2019.  Sudden weight gain at that time.  240 lbs prepregnancy and 247 lbs today. Prior to last summer she was an active runner. She states that her thyroid medication has been increased. Her mom has Type 1 Diabetes.    N/V weeks 12-14 improved with unisom and vitamin B-6.  Currently [redacted] weeks pregnant.  Patient lives with her 33 yo, 33 yo (adopted at birth- has autism) and husband.  She is the Merchandiser, retail for [employer].  Her and her husband share cooking and shopping.  They are both currently working from home.  She states that she likes rules. They have chickens and unlimited eggs.  Preferred Learning Style:   No preference indicated   Learning Readiness:   Ready  Change in progress   MEDICATIONS: see list   DIETARY INTAKE: Usual eating pattern includes 3 meals and 2 snacks per day. Avoided foods include canned vegetables, canned soup.  Allergic to Salicilates (asparin) and therefore can only tolerate small amounts of black pepper and berries.  Allergic to wheat (hives).  Lactose intolerant (mild). Current food aversions.  Likes avocado but cannot eat.  No hard boiled eggs. No soy within 4 hours of levothyroxin. She does eat meat, dairy, eggs but 2/3 of meals are vegetarian. She states that she is not a big sweet eater.  24-hr recall:  B (8 AM): eggs, sauteed greens OR yogurt OR honey nut cheerios and almond milk, black tea  Snk (10-10:30AM):  yogurt or cheese stick  L (11:30-1:30 PM): grain bowl (quinoa, chicken, salad) OR 1/2 [place] bowl (rice, beans, chicken) OR cereal and almond milk OR  GF mac and cheese Snk ( PM): sometimes fruit or toast with cream cheese OR nothing D (6 PM): Vegetable Beef soup (homemade) OR rice, beans, tomatoes OR rotisserie chicken, mashed potatoes, sauteed greens or brussels sprouts OR taco night Snk ( PM): occasional cheese stick or almonds Beverages: water, 1-2 cups black tea, seltzer water, and once per week juice, almond milk  Usual physical:  very little, tries to do laps around the property daily for about 30 minutes  Progress Towards Goal(s):  In progress.   Nutritional Diagnosis:  NB-1.1 Food and nutrition-related knowledge deficit As related to balance of carbohydrates, protein, and fat for pregnancy.  As evidenced by diet hx and patient report.    Intervention:  Nutrition education regarding sources and recommended amounts of carbohydrates, protein, and fat during pregnancy.  Discussed plant based proteins and calcium sources.  Apps such as My Fitness Pal to document intake if she desires and Calorie [employer] to better be informed about what is in the foods eaten out.  Discussed mindfulness and listening to her body to avoid overeating but also to be sure she is meeting her bodies needs. Discussed fiber and calorie density.  Showed visuals for portion size.  Continue to stay active daily as allowed by your MD. Carbohydrate recommendations for pregnancy is a minimum of 170 grams spread throughout the day. Each meal and snack should contain carbohydrates and a small portion of protein. Listen to your body.  Eat  more if hungry.  Focus on foods rich in fiber and those with a lower caloric density- non starchy vegetables. Eat more unprocessed foods such as brown rice, sweet potatoes, legumes, fresh fruits and vegetables and lean meat and fewer processed foods.  Teaching Method Utilized:  Visual Auditory Hands on  Handouts given during visit include:  Calcium content of foods from AND  Pregnancy nutrition from AND  Pregnancy nutrition and  vegetarian diet from AND  Meal plan card  Snack list  Eating out tips  Barriers to learning/adherence to lifestyle change: none  Demonstrated degree of understanding via:  Teach Back   Monitoring/Evaluation:  Dietary intake, exercise, and body weight prn.

## 2019-08-21 DIAGNOSIS — Z361 Encounter for antenatal screening for raised alphafetoprotein level: Secondary | ICD-10-CM | POA: Diagnosis not present

## 2019-09-07 DIAGNOSIS — H52223 Regular astigmatism, bilateral: Secondary | ICD-10-CM | POA: Diagnosis not present

## 2019-09-11 DIAGNOSIS — O358XX0 Maternal care for other (suspected) fetal abnormality and damage, not applicable or unspecified: Secondary | ICD-10-CM | POA: Diagnosis not present

## 2019-09-11 DIAGNOSIS — Z3A19 19 weeks gestation of pregnancy: Secondary | ICD-10-CM | POA: Diagnosis not present

## 2019-09-13 MED FILL — LEVOTHYROXINE 150 MCG TAB: 150 | 30 days supply | Qty: 30 | Fill #1

## 2019-09-14 DIAGNOSIS — E890 Postprocedural hypothyroidism: Secondary | ICD-10-CM | POA: Diagnosis not present

## 2019-09-25 DIAGNOSIS — Z3A21 21 weeks gestation of pregnancy: Secondary | ICD-10-CM | POA: Diagnosis not present

## 2019-09-25 DIAGNOSIS — O43192 Other malformation of placenta, second trimester: Secondary | ICD-10-CM | POA: Diagnosis not present

## 2019-10-12 MED FILL — LEVOTHYROXINE 150 MCG TAB: 150 | 90 days supply | Qty: 90 | Fill #0

## 2019-10-19 ENCOUNTER — Other Ambulatory Visit: Payer: Self-pay

## 2019-10-19 DIAGNOSIS — Z20822 Contact with and (suspected) exposure to covid-19: Secondary | ICD-10-CM

## 2019-10-20 LAB — NOVEL CORONAVIRUS, NAA: SARS-CoV-2, NAA: NOT DETECTED

## 2019-10-26 ENCOUNTER — Other Ambulatory Visit: Payer: Self-pay

## 2019-10-26 ENCOUNTER — Encounter (HOSPITAL_COMMUNITY): Payer: Self-pay | Admitting: *Deleted

## 2019-10-26 ENCOUNTER — Inpatient Hospital Stay (HOSPITAL_COMMUNITY)
Admission: AD | Admit: 2019-10-26 | Discharge: 2019-10-26 | Disposition: A | Discharge status: Home / Self Care | Payer: 59 | Attending: Obstetrics and Gynecology | Admitting: Obstetrics and Gynecology

## 2019-10-26 DIAGNOSIS — O99282 Endocrine, nutritional and metabolic diseases complicating pregnancy, second trimester: Secondary | ICD-10-CM | POA: Insufficient documentation

## 2019-10-26 DIAGNOSIS — E0500 Thyrotoxicosis with diffuse goiter without thyrotoxic crisis or storm: Secondary | ICD-10-CM | POA: Insufficient documentation

## 2019-10-26 DIAGNOSIS — Z3A25 25 weeks gestation of pregnancy: Secondary | ICD-10-CM | POA: Insufficient documentation

## 2019-10-26 DIAGNOSIS — O368120 Decreased fetal movements, second trimester, not applicable or unspecified: Secondary | ICD-10-CM | POA: Diagnosis not present

## 2019-10-26 DIAGNOSIS — Z79890 Hormone replacement therapy (postmenopausal): Secondary | ICD-10-CM | POA: Diagnosis not present

## 2019-10-26 DIAGNOSIS — O288 Other abnormal findings on antenatal screening of mother: Secondary | ICD-10-CM

## 2019-10-26 DIAGNOSIS — Z79899 Other long term (current) drug therapy: Secondary | ICD-10-CM | POA: Insufficient documentation

## 2019-10-26 LAB — URINALYSIS, ROUTINE W REFLEX MICROSCOPIC
Bilirubin Urine: NEGATIVE
Glucose, UA: NEGATIVE mg/dL
Hgb urine dipstick: NEGATIVE
Ketones, ur: NEGATIVE mg/dL
Nitrite: NEGATIVE
Protein, ur: NEGATIVE mg/dL
Specific Gravity, Urine: 1.003 — ABNORMAL LOW (ref 1.005–1.030)
pH: 7 (ref 5.0–8.0)

## 2019-10-26 NOTE — MAU Note (Signed)
Pt reports to MAU complaining of DFM she reports only feeling baby move 4 times since 0800. External FHR 140. No CTX. No vaginal bleeding or abnormal discharge.

## 2019-10-26 NOTE — MAU Provider Note (Signed)
History     CSN: [id number]  Arrival date and time: 10/26/19 2209   First Provider Initiated Contact with Patient 10/26/19 2239      Chief Complaint  Patient presents with  . Decreased Fetal Movement   HPI  Ms.  Donna Tucker is a 33 y.o. year old [id number] female at [redacted]w[redacted]d weeks gestation who presents to MAU reporting DFM since 0800 this AM. She reports "only feeling the baby move 4 times today." She has been working remotely from home all day and noticed DFM around 1600. She drank 2 32 oz cups of ice cold water, ate dinner and drank more cold water. She still only noticed 1-2 movements after all of that. She called [facility] office and the CNM on-call advised her to come here for evaluation. She reports that "he usually moves all of the time. He moves like an acrobat, but not today. That worries me." She denies contractions, vaginal bleeding, loss of fluid, or abnormal vaginal discharge. She receives [facility] with Dr. Ronita Hipps at [place]; where she was last seen on 10/16/2019. Her pregnancy is complicated by h/o SAB x 2, C/S d/t NRFHR, hypothyroidism, and female infertility (this pregnancy is conceived using IUI).   Past Medical History:  Diagnosis Date  . Gluten intolerance   . Graves' disease    endocrinologist--- dr Chalmers Cater-- dx 2012  . Hx of varicella   . Tachycardia   . Wears glasses   . Wheat allergy     Past Surgical History:  Procedure Laterality Date  . CESAREAN SECTION N/A 10/02/2014   Procedure: CESAREAN SECTION;  Surgeon: Lovenia Kim, MD;  Location: [place] [facility];  Service: Obstetrics;  Laterality: N/A;  . THYROIDECTOMY N/A 11/02/2018   Procedure: TOTAL THYROIDECTOMY;  Surgeon: Armandina Gemma, MD;  Location: [facility];  Service: General;  Laterality: N/A;    Family History  Problem Relation Age of Onset  . Diabetes Mother        T1DM  . Stroke Mother   . Hypothyroidism Mother   . Hypertension Father   . Graves' disease Maternal Aunt   . Cancer Maternal Aunt          breast  . Cancer Maternal Grandmother   . Graves' disease Maternal Grandfather   . Graves' disease Cousin   . Cancer Cousin   . Depression Brother     Social History   Tobacco Use  . Smoking status: Never Smoker  . Smokeless tobacco: Never Used  Substance Use Topics  . Alcohol use: Not Currently    Comment: Not since pregnancy  . Drug use: Never    Allergies:  Allergies  Allergen Reactions  . Nsaids Anaphylaxis, Hives and Swelling    " all nsaids"  . Salicylates Anaphylaxis, Hives and Swelling  . Gluten Meal   . Lactose Intolerance (Gi)   . Oxycodone Hives  . Wheat Bran Hives    " all wheat products"    Medications Prior to Admission  Medication Sig Dispense Refill Last Dose  . doxylamine, Sleep, (UNISOM) 25 MG tablet Take 25 mg by mouth at bedtime as needed.   Past Week at Unknown time  . levothyroxine (SYNTHROID) 150 MCG tablet Take 150 mcg by mouth daily before breakfast.   10/26/2019 at Unknown time  . Prenatal Vit-Fe Fumarate-FA (MULTIVITAMIN-PRENATAL) 27-0.8 MG TABS tablet Take 1 tablet by mouth daily at 12 noon.   10/26/2019 at Unknown time  . vitamin B-6 (PYRIDOXINE) 25 MG tablet Take 25 mg by mouth  daily.   Past Month at Unknown time  . calcium carbonate (TUMS) 500 MG chewable tablet Chew 2 tablets (400 mg of elemental calcium total) by mouth 2 (two) times daily. (Patient not taking: Reported on 08/17/2019) 90 tablet 1   . traMADol (ULTRAM) 50 MG tablet Take 1-2 tablets (50-100 mg total) by mouth every 6 (six) hours as needed. (Patient not taking: Reported on 08/17/2019) 15 tablet 0     Review of Systems  Constitutional: Negative.   HENT: Negative.   Eyes: Negative.   Respiratory: Negative.   Cardiovascular: Negative.   Gastrointestinal: Negative.   Endocrine: Negative.   Genitourinary:       Only 2 perceived fetal movements since 1600 today  Musculoskeletal: Negative.   Skin: Negative.   Allergic/Immunologic: Negative.   Neurological: Negative.    Hematological: Negative.   Psychiatric/Behavioral: Negative.    Physical Exam   Blood pressure 106/69, pulse (!) 105, temperature 98.4 F (36.9 C), temperature source Oral, resp. rate 17, SpO2 100 %.  Physical Exam  Nursing note and vitals reviewed. Constitutional: She is oriented to person, place, and time. She appears well-developed and well-nourished.  HENT:  Head: Normocephalic and atraumatic.  Eyes: Pupils are equal, round, and reactive to light.  Cardiovascular: Normal rate.  Respiratory: Effort normal.  Musculoskeletal: Normal range of motion.  Neurological: She is alert and oriented to person, place, and time.  Skin: Skin is warm and dry.  Psychiatric: She has a normal mood and affect. Her behavior is normal. Judgment and thought content normal.   NST - FHR: 140 bpm / moderate variability / accels present / decels absent / TOCO: UI noted  MAU Course  Procedures  MDM CEFM  Reviewed NST results with patient and also explained that there was a lot of audible fetal movement heard while speaking with patient.  Assessment and Plan  NST (non-stress test) nonreactive - Plan: Discharge patient - Reassurance given that NST is appropriate for gestational age - Fetal well-being is good according to [facility] during tonight's visit - Information provided on [facility] - Advised to call [facility] office, if any further problems, questions or concerns - Keep scheduled appointment with [facility] - Patient verbalized an understanding of the plan of care and agrees.    Raelyn Mora, MSN, CNM 10/26/2019, 10:39 PM

## 2019-11-05 DIAGNOSIS — O43192 Other malformation of placenta, second trimester: Secondary | ICD-10-CM | POA: Diagnosis not present

## 2019-11-05 DIAGNOSIS — Z3689 Encounter for other specified antenatal screening: Secondary | ICD-10-CM | POA: Diagnosis not present

## 2019-11-05 DIAGNOSIS — Z3A27 27 weeks gestation of pregnancy: Secondary | ICD-10-CM | POA: Diagnosis not present

## 2019-11-15 DIAGNOSIS — E890 Postprocedural hypothyroidism: Secondary | ICD-10-CM | POA: Diagnosis not present

## 2019-11-20 DIAGNOSIS — Z3689 Encounter for other specified antenatal screening: Secondary | ICD-10-CM | POA: Diagnosis not present

## 2019-11-20 DIAGNOSIS — Z23 Encounter for immunization: Secondary | ICD-10-CM | POA: Diagnosis not present

## 2019-11-20 IMAGING — NM NM THYROID IMAGING W/ UPTAKE MULTI (4&24 HR)
4 series · 4 of 4 positions shown · non-contrast
Comparison: None

CLINICAL DATA: Graves disease, neck swelling, increased appetite,
heat intolerance, difficulty sleeping, irritability, sore throat,
TSH

EXAM:
THYROID SCAN AND UPTAKE - 4 AND 24 HOURS
TECHNIQUE: Following oral administration of D-9XA capsule, anterior planar
imaging was acquired at 24 hours. Thyroid uptake was calculated with
a thyroid probe at 4-6 hours and 24 hours.
RADIOPHARMACEUTICALS:  360 uCi D-9XA sodium iodide p.o.

[Series 1: anterior · 3.25mm/px · 1 of 1 slices shown]
[im 1/1]
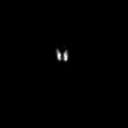

[Series 2: ant w marker · 1.63mm/px · 1 of 1 slices shown]
[im 1/1]
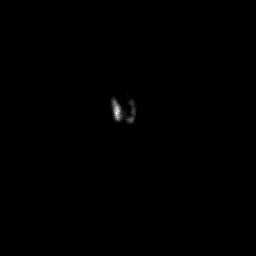

[Series 3: lao · 3.25mm/px · 1 of 1 slices shown]
[im 1/1]
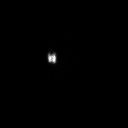

[Series 4: rao · 1.63mm/px · 1 of 1 slices shown]
[im 1/1]
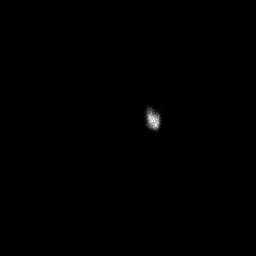

[4 of 4 positions shown; findings below may reference images not displayed]

FINDINGS: Homogeneous tracer distribution in both thyroid lobes.

No focal areas of increased or decreased tracer localization seen..

4 hour D-9XA uptake = 31.8% (normal 5-20%)

24 hour D-9XA uptake = 60.3% (normal 10-30%)
IMPRESSION: Elevated 4 hour and 24 hour radio iodine uptakes as above with
homogeneous tracer distribution in both thyroid lobes.

Findings consistent with Graves disease.

## 2019-11-26 MED FILL — hydrOXYzine HCL 25 MG TABS: 25 | 7 days supply | Qty: 28 | Fill #0

## 2019-11-27 DIAGNOSIS — L299 Pruritus, unspecified: Secondary | ICD-10-CM | POA: Diagnosis not present

## 2019-11-28 DIAGNOSIS — O163 Unspecified maternal hypertension, third trimester: Secondary | ICD-10-CM | POA: Diagnosis not present

## 2019-11-28 DIAGNOSIS — Z3A30 30 weeks gestation of pregnancy: Secondary | ICD-10-CM | POA: Diagnosis not present

## 2019-11-28 DIAGNOSIS — O169 Unspecified maternal hypertension, unspecified trimester: Secondary | ICD-10-CM | POA: Diagnosis not present

## 2019-11-28 MED FILL — LABETALOL HCL 100MG TABLET: 100 | 15 days supply | Qty: 30 | Fill #0

## 2019-12-10 MED FILL — LABETALOL HCL 100MG TABLET: 100 | 15 days supply | Qty: 30 | Fill #1

## 2019-12-13 DIAGNOSIS — O163 Unspecified maternal hypertension, third trimester: Secondary | ICD-10-CM | POA: Diagnosis not present

## 2019-12-13 DIAGNOSIS — Z3A32 32 weeks gestation of pregnancy: Secondary | ICD-10-CM | POA: Diagnosis not present

## 2019-12-13 DIAGNOSIS — Z03818 Encounter for observation for suspected exposure to other biological agents ruled out: Secondary | ICD-10-CM | POA: Diagnosis not present

## 2019-12-14 HISTORY — DX: Maternal care for unspecified type scar from previous cesarean delivery: O34.219

## 2019-12-18 DIAGNOSIS — Z3A33 33 weeks gestation of pregnancy: Secondary | ICD-10-CM | POA: Diagnosis not present

## 2019-12-18 DIAGNOSIS — O163 Unspecified maternal hypertension, third trimester: Secondary | ICD-10-CM | POA: Diagnosis not present

## 2019-12-18 MED FILL — CYCLOBENZAPRINE HCL 10 MG T: 10 | 10 days supply | Qty: 20 | Fill #0

## 2019-12-26 DIAGNOSIS — O163 Unspecified maternal hypertension, third trimester: Secondary | ICD-10-CM | POA: Diagnosis not present

## 2019-12-26 DIAGNOSIS — Z3A34 34 weeks gestation of pregnancy: Secondary | ICD-10-CM | POA: Diagnosis not present

## 2019-12-27 MED FILL — LABETALOL HCL 100MG TABLET: 100 | 15 days supply | Qty: 30 | Fill #2

## 2019-12-28 ENCOUNTER — Other Ambulatory Visit: Payer: Self-pay | Admitting: Obstetrics and Gynecology

## 2020-01-02 ENCOUNTER — Telehealth (HOSPITAL_COMMUNITY): Payer: Self-pay | Admitting: *Deleted

## 2020-01-02 DIAGNOSIS — O163 Unspecified maternal hypertension, third trimester: Secondary | ICD-10-CM | POA: Diagnosis not present

## 2020-01-02 DIAGNOSIS — Z3A35 35 weeks gestation of pregnancy: Secondary | ICD-10-CM | POA: Diagnosis not present

## 2020-01-02 DIAGNOSIS — Z3685 Encounter for antenatal screening for Streptococcus B: Secondary | ICD-10-CM | POA: Diagnosis not present

## 2020-01-02 DIAGNOSIS — E890 Postprocedural hypothyroidism: Secondary | ICD-10-CM | POA: Diagnosis not present

## 2020-01-02 NOTE — Patient Instructions (Addendum)
Donna Tucker  01/02/2020   Your procedure is scheduled on:  1.31.2021  Arrive at 0915 at Entrance C on [address] at [facility]  and [employer]. You are invited to use the FREE valet parking or use the Visitor's parking deck.  Pick up the phone at the desk and dial [phone].  Call this number if you have problems the morning of surgery: [phone]  Remember:   Do not eat food:(After Midnight) Desps de medianoche.  Do not drink clear liquids: (After Midnight) Desps de medianoche.  Take these medicines the morning of surgery with A SIP OF WATER:  Levothyroxine and labetalol   Do not wear jewelry, make-up or nail polish.  Do not wear lotions, powders, or perfumes. Do not wear deodorant.  Do not shave 48 hours prior to surgery.  Do not bring valuables to the hospital.  [facility] is not   responsible for any belongings or valuables brought to the hospital.  Contacts, dentures or bridgework may not be worn into surgery.  Leave suitcase in the car. After surgery it may be brought to your room.  For patients admitted to the hospital, checkout time is 11:00 AM the day of              discharge.      Please read over the following fact sheets that you were given:     Preparing for Surgery

## 2020-01-02 NOTE — Telephone Encounter (Signed)
Preadmission screen  

## 2020-01-03 ENCOUNTER — Encounter (HOSPITAL_COMMUNITY): Payer: Self-pay

## 2020-01-03 MED FILL — LEVOTHYROXINE 150 MCG TAB: 150 | 90 days supply | Qty: 90 | Fill #1

## 2020-01-07 MED FILL — LABETALOL HCL 100MG TABLET: 100 | 15 days supply | Qty: 30 | Fill #3

## 2020-01-08 DIAGNOSIS — I10 Essential (primary) hypertension: Secondary | ICD-10-CM | POA: Diagnosis not present

## 2020-01-08 DIAGNOSIS — E890 Postprocedural hypothyroidism: Secondary | ICD-10-CM | POA: Diagnosis not present

## 2020-01-08 DIAGNOSIS — E559 Vitamin D deficiency, unspecified: Secondary | ICD-10-CM | POA: Diagnosis not present

## 2020-01-08 DIAGNOSIS — Z331 Pregnant state, incidental: Secondary | ICD-10-CM | POA: Diagnosis not present

## 2020-01-09 DIAGNOSIS — O163 Unspecified maternal hypertension, third trimester: Secondary | ICD-10-CM | POA: Diagnosis not present

## 2020-01-09 DIAGNOSIS — Z3A36 36 weeks gestation of pregnancy: Secondary | ICD-10-CM | POA: Diagnosis not present

## 2020-01-10 ENCOUNTER — Encounter (HOSPITAL_COMMUNITY): Payer: Self-pay | Admitting: Obstetrics and Gynecology

## 2020-01-10 ENCOUNTER — Inpatient Hospital Stay (HOSPITAL_COMMUNITY)
Admission: AD | Admit: 2020-01-10 | Discharge: 2020-01-11 | DRG: 832 | Disposition: A | Discharge status: Home / Self Care | Payer: 59 | Attending: Obstetrics and Gynecology | Admitting: Obstetrics and Gynecology

## 2020-01-10 ENCOUNTER — Other Ambulatory Visit: Payer: Self-pay

## 2020-01-10 ENCOUNTER — Other Ambulatory Visit: Payer: Self-pay | Admitting: Obstetrics and Gynecology

## 2020-01-10 DIAGNOSIS — O10013 Pre-existing essential hypertension complicating pregnancy, third trimester: Secondary | ICD-10-CM | POA: Diagnosis present

## 2020-01-10 DIAGNOSIS — O10913 Unspecified pre-existing hypertension complicating pregnancy, third trimester: Secondary | ICD-10-CM | POA: Diagnosis not present

## 2020-01-10 DIAGNOSIS — Z20822 Contact with and (suspected) exposure to covid-19: Secondary | ICD-10-CM | POA: Diagnosis present

## 2020-01-10 DIAGNOSIS — Z3A36 36 weeks gestation of pregnancy: Secondary | ICD-10-CM

## 2020-01-10 DIAGNOSIS — O34219 Maternal care for unspecified type scar from previous cesarean delivery: Secondary | ICD-10-CM | POA: Diagnosis present

## 2020-01-10 DIAGNOSIS — O1490 Unspecified pre-eclampsia, unspecified trimester: Secondary | ICD-10-CM | POA: Diagnosis present

## 2020-01-10 LAB — CBC
HCT: 37.3 % (ref 36.0–46.0)
Hemoglobin: 12.6 g/dL (ref 12.0–15.0)
MCH: 31 pg (ref 26.0–34.0)
MCHC: 33.8 g/dL (ref 30.0–36.0)
MCV: 91.6 fL (ref 80.0–100.0)
Platelets: 232 10*3/uL (ref 150–400)
RBC: 4.07 MIL/uL (ref 3.87–5.11)
RDW: 15 % (ref 11.5–15.5)
WBC: 12 10*3/uL — ABNORMAL HIGH (ref 4.0–10.5)
nRBC: 0 % (ref 0.0–0.2)

## 2020-01-10 LAB — COMPREHENSIVE METABOLIC PANEL
ALT: 14 U/L (ref 0–44)
AST: 18 U/L (ref 15–41)
Albumin: 2.9 g/dL — ABNORMAL LOW (ref 3.5–5.0)
Alkaline Phosphatase: 137 U/L — ABNORMAL HIGH (ref 38–126)
Anion gap: 13 (ref 5–15)
BUN: <5 mg/dL — ABNORMAL LOW (ref 6–20)
CO2: 20 mmol/L — ABNORMAL LOW (ref 22–32)
Calcium: 9.8 mg/dL (ref 8.9–10.3)
Chloride: 103 mmol/L (ref 98–111)
Creatinine, Ser: 0.63 mg/dL (ref 0.44–1.00)
GFR calc Af Amer: >60 mL/min (ref >60)
GFR calc non Af Amer: >60 mL/min (ref >60)
Glucose, Bld: 94 mg/dL (ref 70–99)
Potassium: 3.6 mmol/L (ref 3.5–5.1)
Sodium: 136 mmol/L (ref 135–145)
Total Bilirubin: 0.5 mg/dL (ref 0.3–1.2)
Total Protein: 6.4 g/dL — ABNORMAL LOW (ref 6.5–8.1)

## 2020-01-10 LAB — URINALYSIS, ROUTINE W REFLEX MICROSCOPIC
Bilirubin Urine: NEGATIVE
Glucose, UA: NEGATIVE mg/dL
Hgb urine dipstick: NEGATIVE
Ketones, ur: 20 mg/dL — AB
Nitrite: NEGATIVE
Protein, ur: NEGATIVE mg/dL
Specific Gravity, Urine: 1.011 (ref 1.005–1.030)
pH: 7 (ref 5.0–8.0)

## 2020-01-10 LAB — PROTEIN / CREATININE RATIO, URINE
Creatinine, Urine: 78.24 mg/dL
Protein Creatinine Ratio: 0.24 mg/mg{Cre} — ABNORMAL HIGH (ref 0.00–0.15)
Total Protein, Urine: 19 mg/dL

## 2020-01-10 MED ORDER — PRENATAL MULTIVITAMIN CH: 1.0000 | ORAL_TABLET | Freq: Every day | ORAL | Status: DC

## 2020-01-10 MED ORDER — LABETALOL HCL 100 MG PO TABS
100.0000 mg | ORAL_TABLET | Freq: Two times a day (BID) | ORAL | Status: DC
  Administered 2020-01-11: 100 mg via ORAL
  Filled 2020-01-10: qty 1

## 2020-01-10 MED ORDER — CALCIUM CARBONATE ANTACID 500 MG PO CHEW: 2.0000 | CHEWABLE_TABLET | ORAL | Status: DC | PRN

## 2020-01-10 MED ORDER — ACETAMINOPHEN 325 MG PO TABS
650.0000 mg | ORAL_TABLET | Freq: Once | ORAL | Status: AC
  Administered 2020-01-10: 21:00:00 650 mg via ORAL
  Filled 2020-01-10: qty 2

## 2020-01-10 MED ORDER — DOCUSATE SODIUM 100 MG PO CAPS: 100.0000 mg | ORAL_CAPSULE | Freq: Every day | ORAL | Status: DC

## 2020-01-10 MED ORDER — ZOLPIDEM TARTRATE 5 MG PO TABS: 5.0000 mg | ORAL_TABLET | Freq: Every evening | ORAL | Status: DC | PRN

## 2020-01-10 MED ORDER — ACETAMINOPHEN 325 MG PO TABS
650.0000 mg | ORAL_TABLET | ORAL | Status: DC | PRN
  Administered 2020-01-11: 05:00:00 650 mg via ORAL
  Filled 2020-01-10: qty 2

## 2020-01-10 NOTE — MAU Note (Signed)
Covid swab obtained without difficulty and pt tol well. No symptoms 

## 2020-01-10 NOTE — MAU Note (Signed)
Patient reports she has been having contractions for the past few days that now have become more painful.  Also states her BP was elevated at home today (160/99; 154/97).  Reports she has gestational HTN that has been well controlled on labetalol until today.  Reports having a dull headache occasionally that she hasn't taken anything for.  No current visual disturbances.  No LOF/VB.

## 2020-01-10 NOTE — MAU Provider Note (Addendum)
pChief Complaint:  Hypertension and Contractions   First Provider Initiated Contact with Patient 01/10/20 1956      HPI: Donna Tucker is a 34 y.o. G4P1021 at [redacted]w[redacted]d by LMP who presents to maternity admissions reporting contractions every 4-5 minutes x 2 days and elevated BP at home today. She has dx of GHTN and was started on labetalol 100 mg BID 4-5 weeks ago.  Until now, her BP has been normal on the medication. She reports a mild headache that has not required treatment. It has been intermittent x 2-3 days.  Her contractions are painful in her entire abdomen, intermittent, and radiate to her low back. They are worsening in intensity. There are no other symptoms.  She has not tried any treatments.  She reports good fetal movement.   HPI  Past Medical History: Past Medical History:  Diagnosis Date  . Complication of anesthesia    post partum itching  . Gluten intolerance   . Graves' disease    endocrinologist--- dr Talmage Nap-- dx 2012  . Hx of pre-eclampsia in prior pregnancy, currently pregnant   . Hx of varicella   . Hypothyroidism   . Pregnancy induced hypertension   . Tachycardia   . Wears glasses   . Wheat allergy     Past obstetric history: OB History  Gravida Para Term Preterm AB Living  4 1 1   2 1   SAB TAB Ectopic Multiple Live Births  1   1   1     # Outcome Date GA Lbr Len/2nd Weight Sex Delivery Anes PTL Lv  4 Current           3 Term 10/02/14 [redacted]w[redacted]d  2863 g F CS-LTranv EPI  LIV  2 Ectopic 2012          1 SAB 2010            Past Surgical History: Past Surgical History:  Procedure Laterality Date  . CESAREAN SECTION N/A 10/02/2014   Procedure: CESAREAN SECTION;  Surgeon: 2011, MD;  Location: [facility];  Service: Obstetrics;  Laterality: N/A;  . THYROIDECTOMY N/A 11/02/2018   Procedure: TOTAL THYROIDECTOMY;  Surgeon: Lenoard Aden, MD;  Location: [facility];  Service: General;  Laterality: N/A;    Family History: Family History  Problem Relation Age of  Onset  . Diabetes Mother        T1DM  . Stroke Mother   . Hypothyroidism Mother   . Hypertension Father   . Graves' disease Maternal Aunt   . Cancer Maternal Aunt         breast  . Cancer Maternal Grandmother   . Graves' disease Cousin   . Cancer Cousin   . Depression Brother   . Graves' disease Paternal Grandfather     Social History: Social History   Tobacco Use  . Smoking status: Never Smoker  . Smokeless tobacco: Never Used  Substance Use Topics  . Alcohol use: Not Currently    Comment: Not since pregnancy  . Drug use: Never    Allergies:  Allergies  Allergen Reactions  . Nsaids Anaphylaxis, Hives and Swelling    " all nsaids"  . Salicylates Anaphylaxis, Hives and Swelling  . Gluten Meal   . Lactose Intolerance (Gi)   . Oxycodone Hives  . Wheat Bran Hives    " all wheat products"    Meds:  Medications Prior to Admission  Medication Sig Dispense Refill Last Dose  . labetalol (NORMODYNE) 100 MG tablet  Take 100 mg by mouth 2 (two) times daily.   01/10/2020 at 0900  . doxylamine, Sleep, (UNISOM) 25 MG tablet Take 25 mg by mouth at bedtime as needed.     Marland Kitchen levothyroxine (SYNTHROID) 150 MCG tablet Take 150 mcg by mouth daily before breakfast.   01/10/2020 at 0700  . Prenatal Vit-Fe Fumarate-FA (MULTIVITAMIN-PRENATAL) 27-0.8 MG TABS tablet Take 1 tablet by mouth daily at 12 noon.     . vitamin B-6 (PYRIDOXINE) 25 MG tablet Take 25 mg by mouth daily.       ROS:  Review of Systems  Constitutional: Negative for chills, fatigue and fever.  Eyes: Negative for visual disturbance.  Respiratory: Negative for shortness of breath.   Cardiovascular: Negative for chest pain.  Gastrointestinal: Positive for abdominal pain. Negative for nausea and vomiting.  Genitourinary: Negative for difficulty urinating, dysuria, flank pain, pelvic pain, vaginal bleeding, vaginal discharge and vaginal pain.  Musculoskeletal: Positive for back pain.  Neurological: Positive for headaches.  Negative for dizziness.  Psychiatric/Behavioral: Negative.      I have reviewed patient's Past Medical Hx, Surgical Hx, Family Hx, Social Hx, medications and allergies.   Physical Exam   Patient Vitals for the past 24 hrs:  BP Temp Pulse Resp SpO2 Weight  01/10/20 2216 (!) 139/96 -- (!) 110 -- -- --  01/10/20 2201 135/90 -- (!) 111 -- -- --  01/10/20 2146 137/84 -- (!) 106 -- -- --  01/10/20 2131 (!) 146/83 -- (!) 106 -- -- --  01/10/20 2116 (!) 149/86 -- (!) 101 -- -- --  01/10/20 2101 (!) 150/96 -- 98 -- -- --  01/10/20 2046 (!) 144/92 -- (!) 109 -- -- --  01/10/20 2031 (!) 155/99 -- (!) 108 -- -- --  01/10/20 2015 (!) 158/97 -- (!) 101 -- 98 % --  01/10/20 2001 (!) 170/102 -- 98 -- -- --  01/10/20 1946 138/87 -- 95 -- -- --  01/10/20 1937 (!) 154/84 98.8 F (37.1 C) (!) 103 17 98 % 125 kg   Constitutional: Well-developed, well-nourished female in no acute distress.  Cardiovascular: normal rate Respiratory: normal effort GI: Abd soft, non-tender, gravid appropriate for gestational age.  MS: Extremities nontender, no edema, normal ROM Neurologic: Alert and oriented x 4.  GU: Neg CVAT.  PELVIC EXAM: Cervix pink, visually closed, without lesion, scant white creamy discharge, vaginal walls and external genitalia normal Bimanual exam: Cervix 0/long/high, firm, anterior, neg CMT, uterus nontender, nonenlarged, adnexa without tenderness, enlargement, or mass  Dilation: Fingertip(closed internally) Exam by:: Esau Grew, RN  FHT:  Baseline 145 , moderate variability, accelerations present, no decelerations Contractions: Q 2-10 minutes, irregular, mild to palpation   Labs: Results for orders placed or performed during the hospital encounter of 01/10/20 (from the past 24 hour(s))  Urinalysis, Routine w reflex microscopic     Status: Abnormal   Collection Time: 01/10/20  7:48 PM  Result Value Ref Range   Color, Urine YELLOW YELLOW   APPearance CLOUDY (A) CLEAR   Specific  Gravity, Urine 1.011 1.005 - 1.030   pH 7.0 5.0 - 8.0   Glucose, UA NEGATIVE NEGATIVE mg/dL   Hgb urine dipstick NEGATIVE NEGATIVE   Bilirubin Urine NEGATIVE NEGATIVE   Ketones, ur 20 (A) NEGATIVE mg/dL   Protein, ur NEGATIVE NEGATIVE mg/dL   Nitrite NEGATIVE NEGATIVE   Leukocytes,Ua LARGE (A) NEGATIVE   RBC / HPF 0-5 0 - 5 RBC/hpf   WBC, UA 11-20 0 - 5 WBC/hpf   Bacteria,  UA FEW (A) NONE SEEN   Squamous Epithelial / LPF 21-50 0 - 5   Mucus PRESENT   Protein / creatinine ratio, urine     Status: Abnormal   Collection Time: 01/10/20  7:55 PM  Result Value Ref Range   Creatinine, Urine 78.24 mg/dL   Total Protein, Urine 19 mg/dL   Protein Creatinine Ratio 0.24 (H) 0.00 - 0.15 mg/mg[Cre]  CBC     Status: Abnormal   Collection Time: 01/10/20  8:06 PM  Result Value Ref Range   WBC 12.0 (H) 4.0 - 10.5 K/uL   RBC 4.07 3.87 - 5.11 MIL/uL   Hemoglobin 12.6 12.0 - 15.0 g/dL   HCT 49.6 75.9 - 16.3 %   MCV 91.6 80.0 - 100.0 fL   MCH 31.0 26.0 - 34.0 pg   MCHC 33.8 30.0 - 36.0 g/dL   RDW 84.6 65.9 - 93.5 %   Platelets 232 150 - 400 K/uL   nRBC 0.0 0.0 - 0.2 %  Comprehensive metabolic panel     Status: Abnormal   Collection Time: 01/10/20  8:06 PM  Result Value Ref Range   Sodium 136 135 - 145 mmol/L   Potassium 3.6 3.5 - 5.1 mmol/L   Chloride 103 98 - 111 mmol/L   CO2 20 (L) 22 - 32 mmol/L   Glucose, Bld 94 70 - 99 mg/dL   BUN <5 (L) 6 - 20 mg/dL   Creatinine, Ser 7.01 0.44 - 1.00 mg/dL   Calcium 9.8 8.9 - 77.9 mg/dL   Total Protein 6.4 (L) 6.5 - 8.1 g/dL   Albumin 2.9 (L) 3.5 - 5.0 g/dL   AST 18 15 - 41 U/L   ALT 14 0 - 44 U/L   Alkaline Phosphatase 137 (H) 38 - 126 U/L   Total Bilirubin 0.5 0.3 - 1.2 mg/dL   GFR calc non Af Amer >60 >60 mL/min   GFR calc Af Amer >60 >60 mL/min   Anion gap 13 5 - 15   O/Positive/-- (07/13 0000)  Imaging:  No results found.  MAU Course/MDM: Orders Placed This Encounter  Procedures  . Urinalysis, Routine w reflex microscopic  . CBC   . Comprehensive metabolic panel  . Protein / creatinine ratio, urine    Meds ordered this encounter  Medications  . acetaminophen (TYLENOL) tablet 650 mg     NST reviewed and reactive BP with one severe range in MAU, one at home Mild headache that did NOT resolve with Tylenol given in MAU PEC labs wnl with P/C ratio 0.24  Report to Wynelle Bourgeois, CNM, with Dr Vergie Living in the OR to return call for consult.    Sharen Counter Certified Nurse-Midwife 01/10/2020 10:18 PM   Dr Vergie Living consulted and recommends admission for preeclampsia Dr Billy Coast contacted by other CNM Will admit to [facility] Resume Labetalol 100mg  bid MD to follow  , CNM

## 2020-01-11 ENCOUNTER — Encounter (HOSPITAL_COMMUNITY): Payer: Self-pay

## 2020-01-11 DIAGNOSIS — Z3A36 36 weeks gestation of pregnancy: Secondary | ICD-10-CM | POA: Diagnosis not present

## 2020-01-11 DIAGNOSIS — O10913 Unspecified pre-existing hypertension complicating pregnancy, third trimester: Secondary | ICD-10-CM | POA: Diagnosis not present

## 2020-01-11 LAB — SARS CORONAVIRUS 2 (TAT 6-24 HRS): SARS Coronavirus 2: NEGATIVE

## 2020-01-11 NOTE — H&P (Signed)
Chief Complaint:  Hypertension and Contractions      HPI: Donna Tucker is a 34 y.o. G4P1021 at [redacted]w[redacted]d by LMP who presents to maternity admissions reporting contractions every 4-5 minutes x 2 days and elevated BP at home today. She has dx of GHTN and was started on labetalol 100 mg BID 4-5 weeks ago dx of probable CHTN Her BP has been in mildly elevated range since meds started, Started meds when BP 150s/100. She reports a mild chronic headache that has not required treatment x 2-3 months.  Her contractions are painful in her entire abdomen, intermittent,  And she has had multiple visits with no cervical change. She reports good fetal movement and no LOF.Marland Kitchen    Past Medical History: Past Medical History:  Diagnosis Date  . Complication of anesthesia    post partum itching  . Gluten intolerance   . Graves' disease    endocrinologist--- dr Talmage Nap-- dx 2012  . Hx of pre-eclampsia in prior pregnancy, currently pregnant   . Hx of varicella   . Hypothyroidism   . Pregnancy induced hypertension   . Tachycardia   . Wears glasses   . Wheat allergy     Past obstetric history: OB History  Gravida Para Term Preterm AB Living  4 1 1   2 1   SAB TAB Ectopic Multiple Live Births  1   1   1     # Outcome Date GA Lbr Len/2nd Weight Sex Delivery Anes PTL Lv  4 Current           3 Term 10/02/14 [redacted]w[redacted]d  2863 g F CS-LTranv EPI  LIV  2 Ectopic 2012          1 SAB 2010            Past Surgical History: Past Surgical History:  Procedure Laterality Date  . CESAREAN SECTION N/A 10/02/2014   Procedure: CESAREAN SECTION;  Surgeon: 2011, MD;  Location: [facility];  Service: Obstetrics;  Laterality: N/A;  . THYROIDECTOMY N/A 11/02/2018   Procedure: TOTAL THYROIDECTOMY;  Surgeon: Lenoard Aden, MD;  Location: [facility];  Service: General;  Laterality: N/A;    Family History: Family History  Problem Relation Age of Onset  . Diabetes Mother        T1DM  . Stroke Mother   . Hypothyroidism Mother    . Hypertension Father   . Graves' disease Maternal Aunt   . Cancer Maternal Aunt         breast  . Cancer Maternal Grandmother   . Graves' disease Cousin   . Cancer Cousin   . Depression Brother   . Graves' disease Paternal Grandfather     Social History: Social History   Tobacco Use  . Smoking status: Never Smoker  . Smokeless tobacco: Never Used  Substance Use Topics  . Alcohol use: Not Currently    Comment: Not since pregnancy  . Drug use: Never    Allergies:  Allergies  Allergen Reactions  . Nsaids Anaphylaxis, Hives and Swelling    " all nsaids"  . Salicylates Anaphylaxis, Hives and Swelling  . Gluten Meal   . Lactose Intolerance (Gi)   . Oxycodone Hives  . Wheat Bran Hives    " all wheat products"    Meds:  Medications Prior to Admission  Medication Sig Dispense Refill Last Dose  . labetalol (NORMODYNE) 100 MG tablet Take 100 mg by mouth 2 (two) times daily.   01/10/2020 at 0900  .  doxylamine, Sleep, (UNISOM) 25 MG tablet Take 25 mg by mouth at bedtime as needed.     Marland Kitchen levothyroxine (SYNTHROID) 150 MCG tablet Take 150 mcg by mouth daily before breakfast.   01/10/2020 at 0700  . Prenatal Vit-Fe Fumarate-FA (MULTIVITAMIN-PRENATAL) 27-0.8 MG TABS tablet Take 1 tablet by mouth daily at 12 noon.     . vitamin B-6 (PYRIDOXINE) 25 MG tablet Take 25 mg by mouth daily.         I have reviewed patient's Past Medical Hx, Surgical Hx, Family Hx, Social Hx, medications and allergies.   ROS neg for CP or SOB. NO epigastric pain  Physical Exam   Patient Vitals for the past 24 hrs:  BP Temp Temp src Pulse Resp SpO2 Height Weight  01/10/20 2359 -- -- -- -- -- -- 5' 4.5" (1.638 m) --  01/10/20 2356 (!) 148/94 98.8 F (37.1 C) Oral 93 17 97 % -- --  01/10/20 2331 128/90 -- -- (!) 107 -- -- -- --  01/10/20 2316 140/81 -- -- (!) 116 -- -- -- --  01/10/20 2308 (!) 135/95 -- -- (!) 109 -- -- -- --  01/10/20 2231 (!) 145/89 -- -- (!) 108 -- -- -- --  01/10/20 2216 (!)  139/96 -- -- (!) 110 -- -- -- --  01/10/20 2201 135/90 -- -- (!) 111 -- -- -- --  01/10/20 2146 137/84 -- -- (!) 106 -- -- -- --  01/10/20 2131 (!) 146/83 -- -- (!) 106 -- -- -- --  01/10/20 2116 (!) 149/86 -- -- (!) 101 -- -- -- --  01/10/20 2101 (!) 150/96 -- -- 98 -- -- -- --  01/10/20 2046 (!) 144/92 -- -- (!) 109 -- -- -- --  01/10/20 2031 (!) 155/99 -- -- (!) 108 -- -- -- --  01/10/20 2015 (!) 158/97 -- -- (!) 101 -- 98 % -- --  01/10/20 2001 (!) 170/102 -- -- 98 -- -- -- --  01/10/20 1946 138/87 -- -- 95 -- -- -- --  01/10/20 1937 (!) 154/84 98.8 F (37.1 C) -- (!) 103 17 98 % -- 125 kg   Constitutional: Well-developed, well-nourished female in no acute distress.  HEENT: NL Neck: supple with FROM  Cardiovascular: RRR Respiratory: CTA GI: Abd soft, non-tender, No RUQ tenderness Skin : no lesions. MS: Extremities nontender, no edema, normal ROM Neurologic: Alert and oriented x 4.  GU: Neg CVAT. VE: per CNM     FHT:  Baseline 145 , moderate variability, accelerations present, no decelerations Contractions: Q 2-10 minutes, irregular, mild to palpation   Labs: Results for orders placed or performed during the hospital encounter of 01/10/20 (from the past 24 hour(s))  Urinalysis, Routine w reflex microscopic     Status: Abnormal   Collection Time: 01/10/20  7:48 PM  Result Value Ref Range   Color, Urine YELLOW YELLOW   APPearance CLOUDY (A) CLEAR   Specific Gravity, Urine 1.011 1.005 - 1.030   pH 7.0 5.0 - 8.0   Glucose, UA NEGATIVE NEGATIVE mg/dL   Hgb urine dipstick NEGATIVE NEGATIVE   Bilirubin Urine NEGATIVE NEGATIVE   Ketones, ur 20 (A) NEGATIVE mg/dL   Protein, ur NEGATIVE NEGATIVE mg/dL   Nitrite NEGATIVE NEGATIVE   Leukocytes,Ua LARGE (A) NEGATIVE   RBC / HPF 0-5 0 - 5 RBC/hpf   WBC, UA 11-20 0 - 5 WBC/hpf   Bacteria, UA FEW (A) NONE SEEN   Squamous Epithelial / LPF 21-50 0 - 5  Mucus PRESENT   Protein / creatinine ratio, urine     Status: Abnormal    Collection Time: 01/10/20  7:55 PM  Result Value Ref Range   Creatinine, Urine 78.24 mg/dL   Total Protein, Urine 19 mg/dL   Protein Creatinine Ratio 0.24 (H) 0.00 - 0.15 mg/mg[Cre]  CBC     Status: Abnormal   Collection Time: 01/10/20  8:06 PM  Result Value Ref Range   WBC 12.0 (H) 4.0 - 10.5 K/uL   RBC 4.07 3.87 - 5.11 MIL/uL   Hemoglobin 12.6 12.0 - 15.0 g/dL   HCT 07.3 71.0 - 62.6 %   MCV 91.6 80.0 - 100.0 fL   MCH 31.0 26.0 - 34.0 pg   MCHC 33.8 30.0 - 36.0 g/dL   RDW 94.8 54.6 - 27.0 %   Platelets 232 150 - 400 K/uL   nRBC 0.0 0.0 - 0.2 %  Comprehensive metabolic panel     Status: Abnormal   Collection Time: 01/10/20  8:06 PM  Result Value Ref Range   Sodium 136 135 - 145 mmol/L   Potassium 3.6 3.5 - 5.1 mmol/L   Chloride 103 98 - 111 mmol/L   CO2 20 (L) 22 - 32 mmol/L   Glucose, Bld 94 70 - 99 mg/dL   BUN <5 (L) 6 - 20 mg/dL   Creatinine, Ser 3.50 0.44 - 1.00 mg/dL   Calcium 9.8 8.9 - 09.3 mg/dL   Total Protein 6.4 (L) 6.5 - 8.1 g/dL   Albumin 2.9 (L) 3.5 - 5.0 g/dL   AST 18 15 - 41 U/L   ALT 14 0 - 44 U/L   Alkaline Phosphatase 137 (H) 38 - 126 U/L   Total Bilirubin 0.5 0.3 - 1.2 mg/dL   GFR calc non Af Amer >60 >60 mL/min   GFR calc Af Amer >60 >60 mL/min   Anion gap 13 5 - 15  SARS CORONAVIRUS 2 (TAT 6-24 HRS) Nasopharyngeal Nasopharyngeal Swab     Status: None   Collection Time: 01/10/20 10:54 PM   Specimen: Nasopharyngeal Swab  Result Value Ref Range   SARS Coronavirus 2 NEGATIVE NEGATIVE   O/Positive/-- (07/13 0000)  Imaging:  No results found.  MAU Course/MDM:   Meds ordered this encounter  Medications  . acetaminophen (TYLENOL) tablet 650 mg  . acetaminophen (TYLENOL) tablet 650 mg  . zolpidem (AMBIEN) tablet 5 mg  . docusate sodium (COLACE) capsule 100 mg  . calcium carbonate (TUMS - dosed in mg elemental calcium) chewable tablet 400 mg of elemental calcium  . prenatal multivitamin tablet 1 tablet  . labetalol (NORMODYNE) tablet 100 mg      NST  Reactive, Category 1  PEC labs wnl with P/C ratio 0.24  . IMP: 36+ weeks IUP CHTN- stable. No exacerbation. One likely spurious severe range BP noted. Nl labs Prodromal PTL Previous csection x one.  Admit for observation per recommendation Likely DC in am Olivia Mackie, MD

## 2020-01-11 NOTE — Progress Notes (Signed)
Discharge instructions given to patient. Patient awaiting c/s date and time change. Patient verbalized understanding to discharge instructions.

## 2020-01-11 NOTE — Progress Notes (Signed)
RN notified via phone MD regarding patient's increase in ctx and pain. Pt stated on admission ctx pain rating at 3 and currently rating pain at a 5 reporting increasing discomfort. Patient's ctx are 2-5 mins with regular pattern. MD stated orders to take patient off the toco and fetal monitoring. RN will continue to monitor patient's pain and ctx.

## 2020-01-11 NOTE — Progress Notes (Signed)
HD #1  S: Still uncomfortable with irregular contractions Mild chronic HA but did not sleep well No epigastric pain or SOB . Had one episode where she saw "floaters" looking at her computer. Good FM, No LOF.  BP (!) 142/83   Pulse 87   Temp 98 F (36.7 C) (Oral)   Resp 18   Ht 5' 4.5" (1.638 m)   Wt 125 kg   SpO2 95%   BMI 46.56 kg/m   HEENT: nl NCAT WDWN WF NAD Lungs: CTA CV: RRR ABD gravid NT No CVAT VE: cl/60/-2 Neuro: nonfocal SKin : intact  Category 1 tracing Irregular contractions  IMP: 36+ week IUP Previous csection for rpt CHTN- stable. Labs nl. PTL- no cervical change. Reassuring FHR status  P: DC home PEC precautions. Attempt to reschedule csection to 37w (now at 37.3) per her request.

## 2020-01-11 NOTE — Progress Notes (Signed)
Good FM Tracing reviewed Category 1 with irregular contractions BP (!) 148/91 (BP Location: Right Arm)   Pulse 91   Temp 98 F (36.7 C) (Oral)   Resp 18   Ht 5' 4.5" (1.638 m)   Wt 125 kg   SpO2 95%   BMI 46.56 kg/m   BP stable DC EFM and TOCO

## 2020-01-12 ENCOUNTER — Other Ambulatory Visit (HOSPITAL_COMMUNITY)
Admission: RE | Admit: 2020-01-12 | Discharge: 2020-01-12 | Disposition: A | Discharge status: Home / Self Care | Payer: 59 | Source: Ambulatory Visit | Attending: Obstetrics and Gynecology | Admitting: Obstetrics and Gynecology

## 2020-01-12 ENCOUNTER — Other Ambulatory Visit: Payer: Self-pay

## 2020-01-12 LAB — CBC
HCT: 35.5 % — ABNORMAL LOW (ref 36.0–46.0)
Hemoglobin: 12 g/dL (ref 12.0–15.0)
MCH: 30.8 pg (ref 26.0–34.0)
MCHC: 33.8 g/dL (ref 30.0–36.0)
MCV: 91.3 fL (ref 80.0–100.0)
Platelets: 212 10*3/uL (ref 150–400)
RBC: 3.89 MIL/uL (ref 3.87–5.11)
RDW: 15.2 % (ref 11.5–15.5)
WBC: 9.2 10*3/uL (ref 4.0–10.5)
nRBC: 0 % (ref 0.0–0.2)

## 2020-01-12 LAB — COMPREHENSIVE METABOLIC PANEL
ALT: 13 U/L (ref 0–44)
AST: 19 U/L (ref 15–41)
Albumin: 2.5 g/dL — ABNORMAL LOW (ref 3.5–5.0)
Alkaline Phosphatase: 135 U/L — ABNORMAL HIGH (ref 38–126)
Anion gap: 12 (ref 5–15)
BUN: <5 mg/dL — ABNORMAL LOW (ref 6–20)
CO2: 21 mmol/L — ABNORMAL LOW (ref 22–32)
Calcium: 8.7 mg/dL — ABNORMAL LOW (ref 8.9–10.3)
Chloride: 103 mmol/L (ref 98–111)
Creatinine, Ser: 0.52 mg/dL (ref 0.44–1.00)
GFR calc Af Amer: >60 mL/min (ref >60)
GFR calc non Af Amer: >60 mL/min (ref >60)
Glucose, Bld: 88 mg/dL (ref 70–99)
Potassium: 3.6 mmol/L (ref 3.5–5.1)
Sodium: 136 mmol/L (ref 135–145)
Total Bilirubin: 0.4 mg/dL (ref 0.3–1.2)
Total Protein: 5.7 g/dL — ABNORMAL LOW (ref 6.5–8.1)

## 2020-01-12 LAB — TYPE AND SCREEN
ABO/RH(D): O POS
Antibody Screen: NEGATIVE

## 2020-01-12 LAB — ABO/RH: ABO/RH(D): O POS

## 2020-01-12 LAB — RPR: RPR Ser Ql: NONREACTIVE

## 2020-01-12 NOTE — MAU Note (Signed)
Pt here for PAT lab draw. Denies covid symptoms, had swab done in MAU on 1/28 so not re-swabbed today.

## 2020-01-13 ENCOUNTER — Inpatient Hospital Stay (HOSPITAL_COMMUNITY): Admission: RE | Admit: 2020-01-13 | Payer: 59 | Source: Home / Self Care | Admitting: Obstetrics and Gynecology

## 2020-01-13 ENCOUNTER — Inpatient Hospital Stay (HOSPITAL_COMMUNITY)
Admission: AD | Admit: 2020-01-13 | Discharge: 2020-01-15 | DRG: 787 | Disposition: A | Discharge status: Home / Self Care | Payer: 59 | Attending: Obstetrics and Gynecology | Admitting: Obstetrics and Gynecology

## 2020-01-13 ENCOUNTER — Other Ambulatory Visit: Payer: Self-pay

## 2020-01-13 ENCOUNTER — Inpatient Hospital Stay (HOSPITAL_COMMUNITY): Payer: 59 | Admitting: Anesthesiology

## 2020-01-13 ENCOUNTER — Encounter (HOSPITAL_COMMUNITY)
Admission: AD | Disposition: A | Discharge status: Home / Self Care | Payer: Self-pay | Source: Home / Self Care | Attending: Obstetrics and Gynecology

## 2020-01-13 ENCOUNTER — Encounter (HOSPITAL_COMMUNITY): Payer: Self-pay | Admitting: Obstetrics and Gynecology

## 2020-01-13 DIAGNOSIS — O403XX0 Polyhydramnios, third trimester, not applicable or unspecified: Secondary | ICD-10-CM | POA: Diagnosis present

## 2020-01-13 DIAGNOSIS — E890 Postprocedural hypothyroidism: Secondary | ICD-10-CM | POA: Diagnosis present

## 2020-01-13 DIAGNOSIS — O134 Gestational [pregnancy-induced] hypertension without significant proteinuria, complicating childbirth: Secondary | ICD-10-CM | POA: Diagnosis not present

## 2020-01-13 DIAGNOSIS — O99284 Endocrine, nutritional and metabolic diseases complicating childbirth: Secondary | ICD-10-CM | POA: Diagnosis present

## 2020-01-13 DIAGNOSIS — O99892 Other specified diseases and conditions complicating childbirth: Secondary | ICD-10-CM | POA: Diagnosis present

## 2020-01-13 DIAGNOSIS — O34211 Maternal care for low transverse scar from previous cesarean delivery: Secondary | ICD-10-CM | POA: Diagnosis not present

## 2020-01-13 DIAGNOSIS — Z885 Allergy status to narcotic agent status: Secondary | ICD-10-CM | POA: Diagnosis not present

## 2020-01-13 DIAGNOSIS — O139 Gestational [pregnancy-induced] hypertension without significant proteinuria, unspecified trimester: Secondary | ICD-10-CM | POA: Diagnosis present

## 2020-01-13 DIAGNOSIS — Z3A37 37 weeks gestation of pregnancy: Secondary | ICD-10-CM

## 2020-01-13 DIAGNOSIS — D62 Acute posthemorrhagic anemia: Secondary | ICD-10-CM | POA: Diagnosis not present

## 2020-01-13 DIAGNOSIS — O9081 Anemia of the puerperium: Secondary | ICD-10-CM | POA: Diagnosis not present

## 2020-01-13 DIAGNOSIS — O34219 Maternal care for unspecified type scar from previous cesarean delivery: Secondary | ICD-10-CM | POA: Diagnosis not present

## 2020-01-13 DIAGNOSIS — O99214 Obesity complicating childbirth: Secondary | ICD-10-CM | POA: Diagnosis present

## 2020-01-13 DIAGNOSIS — O1092 Unspecified pre-existing hypertension complicating childbirth: Secondary | ICD-10-CM | POA: Diagnosis not present

## 2020-01-13 SURGERY — Surgical Case
Anesthesia: Spinal

## 2020-01-13 MED ORDER — FENTANYL CITRATE (PF) 100 MCG/2ML IJ SOLN: 25.0000 ug | INTRAMUSCULAR | Status: DC | PRN

## 2020-01-13 MED ORDER — SIMETHICONE 80 MG PO CHEW
80.0000 mg | CHEWABLE_TABLET | Freq: Three times a day (TID) | ORAL | Status: DC
  Administered 2020-01-13 – 2020-01-14 (×3): 80 mg via ORAL
  Filled 2020-01-13 (×6): qty 1

## 2020-01-13 MED ORDER — ONDANSETRON HCL 4 MG/2ML IJ SOLN: 4.0000 mg | Freq: Three times a day (TID) | INTRAMUSCULAR | Status: DC | PRN

## 2020-01-13 MED ORDER — ONDANSETRON HCL 4 MG/2ML IJ SOLN
INTRAMUSCULAR | Status: AC
  Filled 2020-01-13: qty 2

## 2020-01-13 MED ORDER — ONDANSETRON HCL 4 MG/2ML IJ SOLN
INTRAMUSCULAR | Status: DC | PRN
  Administered 2020-01-13: 4 mg via INTRAVENOUS

## 2020-01-13 MED ORDER — METHYLERGONOVINE MALEATE 0.2 MG/ML IJ SOLN: 0.2000 mg | INTRAMUSCULAR | Status: DC | PRN

## 2020-01-13 MED ORDER — SIMETHICONE 80 MG PO CHEW
80.0000 mg | CHEWABLE_TABLET | ORAL | Status: DC | PRN
  Administered 2020-01-15: 80 mg via ORAL
  Filled 2020-01-13: qty 1

## 2020-01-13 MED ORDER — MORPHINE SULFATE (PF) 0.5 MG/ML IJ SOLN
INTRAMUSCULAR | Status: DC | PRN
  Administered 2020-01-13: .15 mg via INTRATHECAL

## 2020-01-13 MED ORDER — ACETAMINOPHEN 10 MG/ML IV SOLN
1000.0000 mg | Freq: Once | INTRAVENOUS | Status: DC | PRN
  Administered 2020-01-13: 1000 mg via INTRAVENOUS

## 2020-01-13 MED ORDER — METHYLERGONOVINE MALEATE 0.2 MG PO TABS: 0.2000 mg | ORAL_TABLET | ORAL | Status: DC | PRN

## 2020-01-13 MED ORDER — ACETAMINOPHEN 500 MG PO TABS
1000.0000 mg | ORAL_TABLET | Freq: Four times a day (QID) | ORAL | Status: DC
  Administered 2020-01-13 – 2020-01-15 (×7): 1000 mg via ORAL
  Filled 2020-01-13 (×8): qty 2

## 2020-01-13 MED ORDER — SENNOSIDES-DOCUSATE SODIUM 8.6-50 MG PO TABS
2.0000 | ORAL_TABLET | ORAL | Status: DC
  Administered 2020-01-13 – 2020-01-14 (×2): 2 via ORAL
  Filled 2020-01-13 (×3): qty 2

## 2020-01-13 MED ORDER — NALOXONE HCL 0.4 MG/ML IJ SOLN: 0.4000 mg | INTRAMUSCULAR | Status: DC | PRN

## 2020-01-13 MED ORDER — ZOLPIDEM TARTRATE 5 MG PO TABS: 5.0000 mg | ORAL_TABLET | Freq: Every evening | ORAL | Status: DC | PRN

## 2020-01-13 MED ORDER — PRENATAL MULTIVITAMIN CH
1.0000 | ORAL_TABLET | Freq: Every day | ORAL | Status: DC
  Administered 2020-01-14 – 2020-01-15 (×2): 1 via ORAL
  Filled 2020-01-13 (×3): qty 1

## 2020-01-13 MED ORDER — LEVOTHYROXINE SODIUM 75 MCG PO TABS
150.0000 ug | ORAL_TABLET | Freq: Every day | ORAL | Status: DC
  Administered 2020-01-14 – 2020-01-15 (×2): 150 ug via ORAL
  Filled 2020-01-13: qty 2
  Filled 2020-01-13: qty 1
  Filled 2020-01-13: qty 2

## 2020-01-13 MED ORDER — FENTANYL CITRATE (PF) 100 MCG/2ML IJ SOLN
INTRAMUSCULAR | Status: DC | PRN
  Administered 2020-01-13: 15 ug via INTRATHECAL

## 2020-01-13 MED ORDER — DIPHENHYDRAMINE HCL 25 MG PO CAPS: 25.0000 mg | ORAL_CAPSULE | ORAL | Status: DC | PRN

## 2020-01-13 MED ORDER — DIPHENHYDRAMINE HCL 25 MG PO CAPS: 25.0000 mg | ORAL_CAPSULE | Freq: Four times a day (QID) | ORAL | Status: DC | PRN

## 2020-01-13 MED ORDER — DEXAMETHASONE SODIUM PHOSPHATE 10 MG/ML IJ SOLN
INTRAMUSCULAR | Status: AC
  Filled 2020-01-13: qty 1

## 2020-01-13 MED ORDER — SCOPOLAMINE 1 MG/3DAYS TD PT72: 1.0000 | MEDICATED_PATCH | Freq: Once | TRANSDERMAL | Status: DC

## 2020-01-13 MED ORDER — DEXAMETHASONE SODIUM PHOSPHATE 10 MG/ML IJ SOLN
INTRAMUSCULAR | Status: DC | PRN
  Administered 2020-01-13: 8 mg via INTRAVENOUS

## 2020-01-13 MED ORDER — DIPHENHYDRAMINE HCL 50 MG/ML IJ SOLN
INTRAMUSCULAR | Status: AC
  Filled 2020-01-13: qty 1

## 2020-01-13 MED ORDER — LACTATED RINGERS IV SOLN: INTRAVENOUS | Status: DC

## 2020-01-13 MED ORDER — LACTATED RINGERS IV SOLN: INTRAVENOUS | Status: DC | PRN

## 2020-01-13 MED ORDER — CEFAZOLIN SODIUM-DEXTROSE 2-4 GM/100ML-% IV SOLN: 2.0000 g | INTRAVENOUS | Status: DC

## 2020-01-13 MED ORDER — NALOXONE HCL 4 MG/10ML IJ SOLN
1.0000 ug/kg/h | INTRAVENOUS | Status: DC | PRN
  Filled 2020-01-13: qty 5

## 2020-01-13 MED ORDER — WITCH HAZEL-GLYCERIN EX PADS: 1.0000 "application " | MEDICATED_PAD | CUTANEOUS | Status: DC | PRN

## 2020-01-13 MED ORDER — COCONUT OIL OIL
1.0000 "application " | TOPICAL_OIL | Status: DC | PRN
  Filled 2020-01-13: qty 120

## 2020-01-13 MED ORDER — MEPERIDINE HCL 25 MG/ML IJ SOLN: 6.2500 mg | INTRAMUSCULAR | Status: DC | PRN

## 2020-01-13 MED ORDER — SODIUM CHLORIDE 0.9% FLUSH: 3.0000 mL | INTRAVENOUS | Status: DC | PRN

## 2020-01-13 MED ORDER — SODIUM CHLORIDE 0.9 % IV SOLN: INTRAVENOUS | Status: DC | PRN

## 2020-01-13 MED ORDER — DIBUCAINE (PERIANAL) 1 % EX OINT
1.0000 "application " | TOPICAL_OINTMENT | CUTANEOUS | Status: DC | PRN
  Filled 2020-01-13: qty 28

## 2020-01-13 MED ORDER — ONDANSETRON HCL 4 MG/2ML IJ SOLN: 4.0000 mg | Freq: Once | INTRAMUSCULAR | Status: DC | PRN

## 2020-01-13 MED ORDER — DIPHENHYDRAMINE HCL 50 MG/ML IJ SOLN
12.5000 mg | INTRAMUSCULAR | Status: DC | PRN
  Administered 2020-01-13 – 2020-01-14 (×2): 12.5 mg via INTRAVENOUS
  Filled 2020-01-13: qty 1

## 2020-01-13 MED ORDER — BUPIVACAINE IN DEXTROSE 0.75-8.25 % IT SOLN
INTRATHECAL | Status: DC | PRN
  Administered 2020-01-13: 1.6 mL via INTRATHECAL

## 2020-01-13 MED ORDER — BUPIVACAINE HCL (PF) 0.25 % IJ SOLN
INTRAMUSCULAR | Status: DC | PRN
  Administered 2020-01-13: 20 mL

## 2020-01-13 MED ORDER — FENTANYL CITRATE (PF) 100 MCG/2ML IJ SOLN
INTRAMUSCULAR | Status: AC
  Filled 2020-01-13: qty 2

## 2020-01-13 MED ORDER — ACETAMINOPHEN 10 MG/ML IV SOLN
INTRAVENOUS | Status: AC
  Filled 2020-01-13: qty 100

## 2020-01-13 MED ORDER — TETANUS-DIPHTH-ACELL PERTUSSIS 5-2.5-18.5 LF-MCG/0.5 IM SUSP
0.5000 mL | Freq: Once | INTRAMUSCULAR | Status: DC
  Filled 2020-01-13: qty 0.5

## 2020-01-13 MED ORDER — SIMETHICONE 80 MG PO CHEW
80.0000 mg | CHEWABLE_TABLET | ORAL | Status: DC
  Administered 2020-01-13 – 2020-01-14 (×2): 80 mg via ORAL
  Filled 2020-01-13 (×3): qty 1

## 2020-01-13 MED ORDER — DEXTROSE 5 % IV SOLN
INTRAVENOUS | Status: DC | PRN
  Administered 2020-01-13: 3 g via INTRAVENOUS

## 2020-01-13 MED ORDER — BUPIVACAINE HCL (PF) 0.25 % IJ SOLN
INTRAMUSCULAR | Status: AC
  Filled 2020-01-13: qty 20

## 2020-01-13 MED ORDER — CEFAZOLIN SODIUM-DEXTROSE 2-4 GM/100ML-% IV SOLN
INTRAVENOUS | Status: AC
  Filled 2020-01-13: qty 100

## 2020-01-13 MED ORDER — MENTHOL 3 MG MT LOZG
1.0000 | LOZENGE | OROMUCOSAL | Status: DC | PRN
  Filled 2020-01-13: qty 9

## 2020-01-13 MED ORDER — DEXTROSE 5 % IV SOLN
INTRAVENOUS | Status: AC
  Filled 2020-01-13: qty 3000

## 2020-01-13 MED ORDER — LABETALOL HCL 100 MG PO TABS
100.0000 mg | ORAL_TABLET | Freq: Two times a day (BID) | ORAL | Status: DC
  Administered 2020-01-13 – 2020-01-15 (×3): 100 mg via ORAL
  Filled 2020-01-13 (×5): qty 1

## 2020-01-13 MED ORDER — PHENYLEPHRINE HCL-NACL 20-0.9 MG/250ML-% IV SOLN
INTRAVENOUS | Status: DC | PRN
  Administered 2020-01-13: 60 ug/min via INTRAVENOUS

## 2020-01-13 MED ORDER — MORPHINE SULFATE (PF) 0.5 MG/ML IJ SOLN
INTRAMUSCULAR | Status: AC
  Filled 2020-01-13: qty 10

## 2020-01-13 MED ORDER — OXYTOCIN 40 UNITS IN NORMAL SALINE INFUSION - SIMPLE MED: 2.5000 [IU]/h | INTRAVENOUS | Status: DC

## 2020-01-13 MED ORDER — OXYTOCIN 40 UNITS IN NORMAL SALINE INFUSION - SIMPLE MED
INTRAVENOUS | Status: DC | PRN
  Administered 2020-01-13: 40 [IU] via INTRAVENOUS

## 2020-01-13 MED ORDER — OXYTOCIN 40 UNITS IN NORMAL SALINE INFUSION - SIMPLE MED
INTRAVENOUS | Status: AC
  Filled 2020-01-13: qty 1000

## 2020-01-13 SURGICAL SUPPLY — 37 items
BENZOIN TINCTURE PRP APPL 2/3 (GAUZE/BANDAGES/DRESSINGS) ×3 IMPLANT
CHLORAPREP W/TINT 26ML (MISCELLANEOUS) ×3 IMPLANT
CLAMP CORD UMBIL (MISCELLANEOUS) IMPLANT
CLOSURE WOUND 1/2 X4 (GAUZE/BANDAGES/DRESSINGS) ×1
CLOTH BEACON ORANGE TIMEOUT ST (SAFETY) ×3 IMPLANT
DRSG OPSITE POSTOP 4X10 (GAUZE/BANDAGES/DRESSINGS) ×3 IMPLANT
ELECT REM PT RETURN 9FT ADLT (ELECTROSURGICAL) ×3
ELECTRODE REM PT RTRN 9FT ADLT (ELECTROSURGICAL) ×1 IMPLANT
EXTRACTOR VACUUM M CUP 4 TUBE (SUCTIONS) IMPLANT
EXTRACTOR VACUUM M CUP 4' TUBE (SUCTIONS)
GLOVE BIO SURGEON STRL SZ7.5 (GLOVE) ×3 IMPLANT
GLOVE BIOGEL PI IND STRL 7.0 (GLOVE) ×1 IMPLANT
GLOVE BIOGEL PI INDICATOR 7.0 (GLOVE) ×2
GOWN STRL REUS W/TWL LRG LVL3 (GOWN DISPOSABLE) ×6 IMPLANT
KIT ABG SYR 3ML LUER SLIP (SYRINGE) IMPLANT
NEEDLE HYPO 22GX1.5 SAFETY (NEEDLE) ×3 IMPLANT
NEEDLE HYPO 25X5/8 SAFETYGLIDE (NEEDLE) IMPLANT
NEEDLE SPNL 20GX3.5 QUINCKE YW (NEEDLE) IMPLANT
NS IRRIG 1000ML POUR BTL (IV SOLUTION) ×3 IMPLANT
PACK C SECTION WH (CUSTOM PROCEDURE TRAY) ×3 IMPLANT
PAD ABD 8X7 1/2 STERILE (GAUZE/BANDAGES/DRESSINGS) ×6 IMPLANT
PENCIL SMOKE EVAC W/HOLSTER (ELECTROSURGICAL) ×3 IMPLANT
STRIP CLOSURE SKIN 1/2X4 (GAUZE/BANDAGES/DRESSINGS) ×2 IMPLANT
SUT MNCRL 0 VIOLET CTX 36 (SUTURE) ×2 IMPLANT
SUT MNCRL AB 3-0 PS2 27 (SUTURE) IMPLANT
SUT MON AB 2-0 CT1 27 (SUTURE) ×3 IMPLANT
SUT MON AB-0 CT1 36 (SUTURE) ×6 IMPLANT
SUT MONOCRYL 0 CTX 36 (SUTURE) ×4
SUT PLAIN 0 NONE (SUTURE) IMPLANT
SUT PLAIN 2 0 (SUTURE)
SUT PLAIN 2 0 XLH (SUTURE) IMPLANT
SUT PLAIN ABS 2-0 CT1 27XMFL (SUTURE) IMPLANT
SYR 20CC LL (SYRINGE) IMPLANT
SYR CONTROL 10ML LL (SYRINGE) ×3 IMPLANT
TOWEL OR 17X24 6PK STRL BLUE (TOWEL DISPOSABLE) ×3 IMPLANT
TRAY FOLEY W/BAG SLVR 14FR LF (SET/KITS/TRAYS/PACK) ×3 IMPLANT
WATER STERILE IRR 1000ML POUR (IV SOLUTION) ×3 IMPLANT

## 2020-01-13 NOTE — Transfer of Care (Signed)
Immediate Anesthesia Transfer of Care Note  Patient: Donna Tucker  Procedure(s) Performed: Repeat CESAREAN SECTION (N/A )  Patient Location: PACU  Anesthesia Type:Spinal  Level of Consciousness: awake, alert , oriented and patient cooperative  Airway & Oxygen Therapy: Patient Spontanous Breathing  Post-op Assessment: Report given to RN and Post -op Vital signs reviewed and stable  Post vital signs: Reviewed and stable  Last Vitals:  Vitals Value Taken Time  BP    Temp    Pulse 74 01/13/20 1222  Resp 19 01/13/20 1222  SpO2 100 % 01/13/20 1222  Vitals shown include unvalidated device data.  Last Pain:  Vitals:   01/13/20 0944  TempSrc: Oral  PainSc: 4          Complications: No apparent anesthesia complications

## 2020-01-13 NOTE — Lactation Note (Signed)
This note was copied from a baby's chart. Lactation Consultation Note  Patient Name: Donna Tucker [id number] Date: 01/13/2020 Reason for consult: Initial assessment;Early term 37-38.6wks;Maternal endocrine disorder Type of Endocrine Disorder?: Thyroid (Graves Disease, on levothyroxine, GHTN on labetalol/chronic HTN. ([employer] Employee- looking at pump choices)  LC into visit with P2 Mom of ET infant at 4 hrs old, delivered by C/Section.  Baby 7 lbs. 6.8 oz and is currently under warmer for a couple low temps.    Mom's room noted to feel cool, so increased temperature to 72.   Reviewed breast massage and hand expression.  Mom able to express a small drop from each breast.  Mom has large, heavy breasts with erect nipples and compressible areola.  Talked about baby being 37 weeks and a good size.  Explained how babies are sleepy acting during the first 20-24 hrs.  Talked about possibly adding double pumping to support a full milk supply if baby continues to be sleepy.  Mom very interested in being proactive.  Set up DEBP and taught Mom how to set pump on initiation setting.  24 mm flanges appear to be a good size.    Baby returned from 5th floor nursery warmer.  Unwrapped baby and placed him STS on Mom's chest.  Mom anxious to have baby latch and was pinching up her nipples and putting in his mouth.  Baby sound asleep.  No rooting noted.  Reassured Mom.  Assisted Mom with double pumping, FOB helped hold one flange, while baby was STS on her chest sleeping.  Repeat temperature to be taken in an hour.  Talked about supplementing baby with EBM+/donor milk if baby's temperature drops again.  Mom is very interested in donor milk if she isn't able to express her own.   Encouraged keeping baby STS and calling out for latch assist when baby starts cueing.  Lactation brochure left in room.  Mom aware of IP and OP lactation support available to her.   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  Maternal Data Formula Feeding for Exclusion: No Has patient been taught Hand Expression?: Yes Does the patient have breastfeeding experience prior to this delivery?: Yes  Feeding Feeding Type: Breast Fed  LATCH Score Latch: Too sleepy or reluctant, no latch achieved, no sucking elicited.  Audible Swallowing: None  Type of Nipple: Everted at rest and after stimulation(short nipple shafts)  Comfort (Breast/Nipple): Soft / non-tender  Hold (Positioning): Full assist, staff holds infant at breast  LATCH Score: 4  Interventions Interventions: Breast feeding basics reviewed;Assisted with latch;Skin to skin;Breast massage;Hand express;Support pillows;DEBP  Lactation Tools Discussed/Used Tools: Pump;Flanges Flange Size: 24 Breast pump type: Double-Electric Breast Pump WIC Program: No Pump Review: Setup, frequency, and cleaning;Milk Storage Initiated by:: Erby Pian RN IBCLC Date initiated:: 01/13/20   Consult Status Consult Status: Follow-up Date: 01/14/20 Follow-up type: In-patient    [name] 01/13/2020, 4:33 PM

## 2020-01-13 NOTE — Anesthesia Preprocedure Evaluation (Addendum)
Anesthesia Evaluation  Patient identified by MRN, date of birth, ID band Patient awake    Reviewed: Allergy & Precautions, H&P , NPO status , Patient's Chart, lab work & pertinent test results, reviewed documented beta blocker date and time   History of Anesthesia Complications Negative for: history of anesthetic complications  Airway Mallampati: II  TM Distance: >3 FB Neck ROM: full    Dental no notable dental hx.    Pulmonary neg pulmonary ROS,    Pulmonary exam normal        Cardiovascular hypertension, On Home Beta Blockers and On Medications Normal cardiovascular exam     Neuro/Psych negative neurological ROS  negative psych ROS   GI/Hepatic negative GI ROS, Neg liver ROS,   Endo/Other  Hypothyroidism (h/o Graves disease) Morbid obesity  Renal/GU negative Renal ROS  negative genitourinary   Musculoskeletal   Abdominal   Peds  Hematology negative hematology ROS (+)   Anesthesia Other Findings   Reproductive/Obstetrics (+) Pregnancy                            Anesthesia Physical Anesthesia Plan  ASA: III  Anesthesia Plan: Spinal   Post-op Pain Management:    Induction:   PONV Risk Score and Plan: Ondansetron and Treatment may vary due to age or medical condition  Airway Management Planned:   Additional Equipment:   Intra-op Plan:   Post-operative Plan:   Informed Consent: I have reviewed the patients History and Physical, chart, labs and discussed the procedure including the risks, benefits and alternatives for the proposed anesthesia with the patient or authorized representative who has indicated his/her understanding and acceptance.       Plan Discussed with:   Anesthesia Plan Comments:         Anesthesia Quick Evaluation

## 2020-01-13 NOTE — Anesthesia Procedure Notes (Signed)
Spinal  Patient location during procedure: OR Staffing Performed: anesthesiologist  Anesthesiologist: Yitzchak Kothari E, MD Preanesthetic Checklist Completed: patient identified, IV checked, risks and benefits discussed, surgical consent, monitors and equipment checked, pre-op evaluation and timeout performed Spinal Block Patient position: sitting Prep: DuraPrep and site prepped and draped Patient monitoring: continuous pulse ox, blood pressure and heart rate Approach: midline Location: L3-4 Injection technique: single-shot Needle Needle type: Pencan  Needle gauge: 24 G Needle length: 9 cm Additional Notes Functioning IV was confirmed and monitors were applied. Sterile prep and drape, including hand hygiene and sterile gloves were used. The patient was positioned and the spine was prepped. The skin was anesthetized with lidocaine.  Free flow of clear CSF was obtained prior to injecting local anesthetic into the CSF. The needle was carefully withdrawn. The patient tolerated the procedure well.      

## 2020-01-13 NOTE — H&P (Signed)
Donna Tucker is a 34 y.o. female presenting for rpt csection at 37 wks due to exacerbation of likey CHTN. OB History    Gravida  4   Para  1   Term  1   Preterm      AB  2   Living  1     SAB  1   TAB      Ectopic  1   Multiple      Live Births  1          Past Medical History:  Diagnosis Date  . Complication of anesthesia    post partum itching  . Gluten intolerance   . Graves' disease    endocrinologist--- dr Talmage Nap-- dx 2012  . Hx of pre-eclampsia in prior pregnancy, currently pregnant   . Hx of varicella   . Hypothyroidism   . Pregnancy induced hypertension   . Tachycardia   . Wears glasses   . Wheat allergy    Past Surgical History:  Procedure Laterality Date  . CESAREAN SECTION N/A 10/02/2014   Procedure: CESAREAN SECTION;  Surgeon: Lenoard Aden, MD;  Location: [facility];  Service: Obstetrics;  Laterality: N/A;  . THYROIDECTOMY N/A 11/02/2018   Procedure: TOTAL THYROIDECTOMY;  Surgeon: Darnell Level, MD;  Location: [facility];  Service: General;  Laterality: N/A;   Family History: family history includes Cancer in her cousin, maternal aunt, and maternal grandmother; Depression in her brother; Diabetes in her mother; Luiz Blare' disease in her cousin, maternal aunt, and paternal grandfather; Hypertension in her father; Hypothyroidism in her mother; Stroke in her mother. Social History:  reports that she has never smoked. She has never used smokeless tobacco. She reports previous alcohol use. She reports that she does not use drugs.     Maternal Diabetes: No Genetic Screening: Normal Maternal Ultrasounds/Referrals: Normal Fetal Ultrasounds or other Referrals:  None Maternal Substance Abuse:  No Significant Maternal Medications:  None Significant Maternal Lab Results:  Group B Strep negative Other Comments:  None  Review of Systems  Constitutional: Negative.   All other systems reviewed and are negative.  Maternal Medical History:  Reason for  admission: Contractions.   Contractions: Onset was 1 week or more ago.   Frequency: irregular.   Perceived severity is mild.    Fetal activity: Perceived fetal activity is normal.   Last perceived fetal movement was within the past hour.    Prenatal complications: PIH and polyhydramnios.   Prenatal Complications - Diabetes: none.      There were no vitals taken for this visit. Exam Physical Exam  Prenatal labs: ABO, Rh: --/--/O POS, O POS Performed at [facility], [address] [place], [place] [address]  [phone]/30 0800) Antibody: NEG (01/30 0800) Rubella: Immune (07/13 0000) RPR: NON REACTIVE (01/30 0820)  HBsAg: Negative (07/13 0000)  HIV: Non-reactive (07/13 0000)  GBS:     Assessment/Plan: 37 wk IUP Previous csection for rpt CHTN with exacerbation Rpt csection. Consent done. Risks vs benefits of surgery discussed. Declines TOLAC.   Mitesh Rosendahl J 01/13/2020, 9:03 AM

## 2020-01-13 NOTE — H&P (Signed)
Donna Tucker is a 34 y.o. female presenting for rpt csection at 37 wks due to exacerbation of likey CHTN. OB History    Gravida  4   Para  1   Term  1   Preterm      AB  2   Living  1     SAB  1   TAB      Ectopic  1   Multiple      Live Births  1          Past Medical History:  Diagnosis Date  . Complication of anesthesia    post partum itching  . Gluten intolerance   . Graves' disease    endocrinologist--- dr Talmage Nap-- dx 2012  . Hx of pre-eclampsia in prior pregnancy, currently pregnant   . Hx of varicella   . Hypothyroidism   . Pregnancy induced hypertension   . Tachycardia   . Wears glasses   . Wheat allergy    Past Surgical History:  Procedure Laterality Date  . CESAREAN SECTION N/A 10/02/2014   Procedure: CESAREAN SECTION;  Surgeon: Lenoard Aden, MD;  Location: [facility];  Service: Obstetrics;  Laterality: N/A;  . THYROIDECTOMY N/A 11/02/2018   Procedure: TOTAL THYROIDECTOMY;  Surgeon: Darnell Level, MD;  Location: [facility];  Service: General;  Laterality: N/A;   Family History: family history includes Cancer in her cousin, maternal aunt, and maternal grandmother; Depression in her brother; Diabetes in her mother; Luiz Blare' disease in her cousin, maternal aunt, and paternal grandfather; Hypertension in her father; Hypothyroidism in her mother; Stroke in her mother. Social History:  reports that she has never smoked. She has never used smokeless tobacco. She reports previous alcohol use. She reports that she does not use drugs.     Maternal Diabetes: No Genetic Screening: Normal Maternal Ultrasounds/Referrals: Normal Fetal Ultrasounds or other Referrals:  None Maternal Substance Abuse:  No Significant Maternal Medications:  None, synthroid, labetalol Significant Maternal Lab Results:  Group B Strep negative Other Comments:  None  Review of Systems  Constitutional: Negative.   All other systems reviewed and are negative.  Maternal Medical  History:  Reason for admission: Contractions.   Contractions: Onset was 1 week or more ago.   Frequency: irregular.   Perceived severity is mild.    Fetal activity: Perceived fetal activity is normal.   Last perceived fetal movement was within the past hour.    Prenatal complications: PIH and polyhydramnios.   Prenatal Complications - Diabetes: none.      Blood pressure 123/85, temperature 98.4 F (36.9 C), temperature source Oral, height 5' 4.5" (1.638 m), weight 125 kg. Exam Physical Exam  Prenatal labs: ABO, Rh: --/--/O POS, O POS Performed at [facility], [address], [place], [place] [address]  [phone]/30 0800) Antibody: NEG (01/30 0800) Rubella: Immune (07/13 0000) RPR: NON REACTIVE (01/30 0820)  HBsAg: Negative (07/13 0000)  HIV: Non-reactive (07/13 0000)  GBS:   neg  Assessment/Plan: 37 wk IUP Previous csection for rpt CHTN with exacerbation Rpt csection. Consent done. Risks vs benefits of surgery discussed. Declines TOLAC.   Tecia Cinnamon J 01/13/2020, 11:00 AM

## 2020-01-13 NOTE — Anesthesia Postprocedure Evaluation (Signed)
Anesthesia Post Note  Patient: Donna Tucker  Procedure(s) Performed: Repeat CESAREAN SECTION (N/A )     Patient location during evaluation: PACU Anesthesia Type: Spinal Level of consciousness: oriented and awake and alert Pain management: pain level controlled Vital Signs Assessment: post-procedure vital signs reviewed and stable Respiratory status: spontaneous breathing, respiratory function stable and nonlabored ventilation Cardiovascular status: blood pressure returned to baseline and stable Postop Assessment: no headache, no backache, no apparent nausea or vomiting and spinal receding Anesthetic complications: no    Last Vitals:  Vitals:   01/13/20 1336 01/13/20 1407  BP:  134/89  Pulse:  74  Resp: 19 15  Temp:  37 C  SpO2:  100%    Last Pain:  Vitals:   01/13/20 1407  TempSrc: Oral  PainSc:    Pain Goal:                   Lucretia Kern

## 2020-01-13 NOTE — Op Note (Signed)
Cesarean Section Procedure Note  Indications: previous uterine incision Kerr x one and Gestational HTN  Pre-operative Diagnosis: 37 week 0 day pregnancy.  Post-operative Diagnosis: same  Surgeon: Lenoard Aden   Assistants: Renae Fickle, CNM  Anesthesia: Local anesthesia 0.25.% bupivacaine and Spinal anesthesia  ASA Class: 2  Procedure Details  The patient was seen in the Holding Room. The risks, benefits, complications, treatment options, and expected outcomes were discussed with the patient.  The patient concurred with the proposed plan, giving informed consent. The risks of anesthesia, infection, bleeding and possible injury to other organs discussed. Injury to bowel, bladder, or ureter with possible need for repair discussed. Possible need for transfusion with secondary risks of hepatitis or HIV acquisition discussed. Post operative complications to include but not limited to DVT, PE and Pneumonia noted. The site of surgery properly noted/marked. The patient was taken to Operating Room # C, identified as Donna Tucker and the procedure verified as C-Section Delivery. A Time Out was held and the above information confirmed.  After induction of anesthesia, the patient was draped and prepped in the usual sterile manner. A Pfannenstiel incision was made and carried down through the subcutaneous tissue to the fascia. Fascial incision was made and extended transversely using Mayo scissors. The fascia was separated from the underlying rectus tissue superiorly and inferiorly. The peritoneum was identified and entered. Peritoneal incision was extended longitudinally. The utero-vesical peritoneal reflection was incised transversely and the bladder flap was bluntly freed from the lower uterine segment. A low transverse uterine incision(Kerr hysterotomy) was made. Delivered from OA presentation was a  female with Apgar scores of 8 at one minute and 9 at five minutes. Bulb suctioning gently performed. Neonatal  team in attendance.After the umbilical cord was clamped and cut cord blood was obtained for evaluation. The placenta was removed intact and appeared normal. The uterus was curetted with a dry lap pack. Good hemostasis was noted.The uterine outline, tubes and ovaries appeared normal. The uterine incision was closed with running locked sutures of 0 Monocryl x 2 layers. Hemostasis was observed. Lavage was carried out until clear.The parietal peritoneum was closed with a running 2-0 Monocryl suture. The fascia was then reapproximated with running sutures of 0 Monocryl. The skin was reapproximated with 3-0 monocryl after Marcus closure with 2-0 plain.  Instrument, sponge, and needle counts were correct prior the abdominal closure and at the conclusion of the case.   Findings: FTLM, OA, posterior placenta, nl adnexa, clear AF  Estimated Blood Loss:  600         Drains: foley                 Specimens: placenta                 Complications:  None; patient tolerated the procedure well.         Disposition: PACU - hemodynamically stable.         Condition: stable  Attending Attestation: I performed the procedure.

## 2020-01-14 ENCOUNTER — Other Ambulatory Visit (HOSPITAL_COMMUNITY)
Admission: RE | Admit: 2020-01-14 | Discharge: 2020-01-14 | Disposition: A | Discharge status: Home / Self Care | Payer: 59 | Source: Ambulatory Visit | Attending: Obstetrics and Gynecology | Admitting: Obstetrics and Gynecology

## 2020-01-14 HISTORY — DX: Hypothyroidism, unspecified: E03.9

## 2020-01-14 HISTORY — DX: Gestational (pregnancy-induced) hypertension without significant proteinuria, unspecified trimester: O13.9

## 2020-01-14 HISTORY — DX: Supervision of pregnancy with other poor reproductive or obstetric history, unspecified trimester: O09.299

## 2020-01-14 HISTORY — DX: Other complications of anesthesia, initial encounter: T88.59XA

## 2020-01-14 LAB — CBC
HCT: 32.2 % — ABNORMAL LOW (ref 36.0–46.0)
Hemoglobin: 11.2 g/dL — ABNORMAL LOW (ref 12.0–15.0)
MCH: 31.5 pg (ref 26.0–34.0)
MCHC: 34.8 g/dL (ref 30.0–36.0)
MCV: 90.7 fL (ref 80.0–100.0)
Platelets: 210 10*3/uL (ref 150–400)
RBC: 3.55 MIL/uL — ABNORMAL LOW (ref 3.87–5.11)
RDW: 14.8 % (ref 11.5–15.5)
WBC: 13.2 10*3/uL — ABNORMAL HIGH (ref 4.0–10.5)
nRBC: 0 % (ref 0.0–0.2)

## 2020-01-14 MED ORDER — TRAMADOL HCL 50 MG PO TABS
50.0000 mg | ORAL_TABLET | Freq: Four times a day (QID) | ORAL | Status: DC | PRN
  Administered 2020-01-14 – 2020-01-15 (×4): 50 mg via ORAL
  Filled 2020-01-14 (×4): qty 1

## 2020-01-14 NOTE — Lactation Note (Signed)
This note was copied from a baby's chart. Lactation Consultation Note  Patient Name: Donna Tucker Today's Date: 01/14/2020  P2, 17 hour ETI female infant. Per mom, infant is now staring to breast feed well, most feeding are 30 minutes in length. LC did not see a latch,  mom last breastfed for 15 minutes prior to [facility] entering the room. Mom doesn't have any questions or concerns at this time.  Mom knows if she needs assistance with latch to call RN or LC services.   Maternal Data    Feeding Feeding Type: Breast Fed  LATCH Score                   Interventions    Lactation Tools Discussed/Used     Consult Status      Danelle Earthly 01/14/2020, 4:37 AM

## 2020-01-14 NOTE — Lactation Note (Addendum)
This note was copied from a baby's chart. Lactation Consultation Note  Patient Name: Donna Tucker Today's Date: 01/14/2020    Per Mom, infant has begun feeding more often with some longer feedings. Mom feels that breastfeeding is going well. I educated Mom that sometimes 37-week infants may latch to the breast & appear to be doing well, but may not be transferring as much as thought. Cervical auscultation was done for a brief time & at that moment, swallows were noted to be every 6-12 sucks (while Mom was doing breast compressions). I mentioned the possibility of supplementing at the breast with donor milk. Mom had questions about donor milk & will consider it if infant is not content after feedings or if there are issues with weight loss.   Per maternal report, Mom has not been able express any/much colostrum when she pumps and gets nothing beyond drops when she hand expresses. Mom's nipple has been pinched when infant releases latch, but infant may have been allowing nipple to sit in a more anterior portion of his mouth before releasing latch. [facility] student assisted with latching to R & L breasts.   Mom's UMR pump was provided.   Lurline Hare [facility] 01/14/2020, 12:20 PM

## 2020-01-14 NOTE — Progress Notes (Signed)
B/P 113/69, HR 68. Patient due for the 1000 dose of Labetalol 100 mg. Notified M. Sigmon, CNM. Orders to hold this dose of Labetalol.

## 2020-01-14 NOTE — Progress Notes (Signed)
POSTOPERATIVE DAY # 1 S/P Repeat LTCS for CHTN exacerbation, baby boy "Samuel Bouche"   S:         Reports feeling well; expresses concern over pain management option given allergies to oxycodone and all NSAIDs.   Denies HA, visual changes, RUQ/epigastric pain             Tolerating po intake / no nausea / no vomiting / + flatus / no BM  Denies dizziness, SOB, or CP             Bleeding is light             Pain controlled with Tylenol             Up ad lib / ambulatory/ voiding QS without difficulty  Newborn breast feeding - going well  / Circumcision - declined    O:  VS: BP 113/69 (BP Location: Left Arm) Comment: notified M.Jonathon Castelo, CNM,holding this dose Labetalol  Pulse 68   Temp 98.9 F (37.2 C) (Oral)   Resp 18   Ht 5' 4.5" (1.638 m)   Wt 125 kg   SpO2 97%   Breastfeeding Unknown   BMI 46.56 kg/m    LABS:               Recent Labs    01/12/20 0820 01/14/20 0405  WBC 9.2 13.2*  HGB 12.0 11.2*  PLT 212 210               Bloodtype: --/--/O POS, O POS Performed at [facility], [address], [place], [place] [address]  [phone]/30 0800)  Rubella: Immune (07/13 0000)                                             I&O: Intake/Output      01/31 0701 - 02/01 0700 02/01 0701 - 02/02 0700   I.V. (mL/kg) 2400 (19.2)    Total Intake(mL/kg) 2400 (19.2)    Urine (mL/kg/hr) 4125    Blood 500    Total Output 4625    Net -2225                      Physical Exam:             Alert and Oriented X3  Lungs: Clear and unlabored  Heart: regular rate and rhythm / no murmurs  Abdomen: soft, non-tender,  Mild gaseous distention, active bowel sounds in all quadrants, obese             Fundus: firm, non-tender, U-2             Dressing: pressure dressing in place, honeycomb visualized >50% of old drainage noted              Incision:  approximated with sutures / no erythema / no ecchymosis / no drainage  Perineum: intact  Lochia: small rubra on pad   Extremities: +1 LE edema, no calf  pain or tenderness  A/P:     POD # 1 S/P Repeat LTCS            CHTN with exacerbation    - On Labetalol 100mg  BID   - BPs low normal, morning dose held   - No s/s of PEC  Hx.of Grave's disease with Surgical hypothyroidism   - on Synthroid daily  Mild ABL Anemia    - stable, asymptomatic  Routine postoperative care              Change honeycomb dressing after shower  Continue working with lactation  Consulted pharmacy for pain medication options given allergies, plan for Tramadol 50mg  q6hrs PRN for severe pain with close monitoring  Benadryl PRN for allergic reaction  Desires early d/c home tomorrow - will plan for 1 week BP f/u PP  Lars Pinks, MSN, CNM [facility]

## 2020-01-15 DIAGNOSIS — E890 Postprocedural hypothyroidism: Secondary | ICD-10-CM | POA: Diagnosis present

## 2020-01-15 MED ORDER — TRAMADOL HCL 50 MG PO TABS: 50.0000 mg | ORAL_TABLET | Freq: Four times a day (QID) | ORAL | 0 refills | Status: DC | PRN

## 2020-01-15 MED ORDER — SENNOSIDES-DOCUSATE SODIUM 8.6-50 MG PO TABS: 2.0000 | ORAL_TABLET | ORAL | Status: DC

## 2020-01-15 MED ORDER — COCONUT OIL OIL: 1.0000 "application " | TOPICAL_OIL | 0 refills | Status: DC | PRN

## 2020-01-15 MED ORDER — ACETAMINOPHEN 500 MG PO TABS: 1000.0000 mg | ORAL_TABLET | Freq: Four times a day (QID) | ORAL | 0 refills | Status: DC

## 2020-01-15 MED ORDER — SIMETHICONE 80 MG PO CHEW: 80.0000 mg | CHEWABLE_TABLET | ORAL | 0 refills | Status: DC | PRN

## 2020-01-15 MED FILL — traMADol HCL 50 MG TABS: 50 | 8 days supply | Qty: 30 | Fill #0

## 2020-01-15 NOTE — Discharge Summary (Signed)
OB Discharge Summary  Patient Name: Donna Tucker DOB: Mar 29, 1986 MRN: [id number]  Date of admission: 01/13/2020 Delivering provider: Brien Few   Date of discharge: 01/15/2020  Admitting diagnosis: Cesarean delivery due to maternal disorder [O99.892] Previous cesarean delivery affecting pregnancy [O34.219] Intrauterine pregnancy: [redacted]w[redacted]d     Secondary diagnosis:Principal Problem:   Postpartum care following cesarean delivery (1/31) Active Problems:   Gestational hypertension   Cesarean delivery due to maternal disorder   Previous cesarean delivery affecting pregnancy   Post-surgical hypothyroidism  Additional problems:none     Discharge diagnosis:  Patient Active Problem List   Diagnosis Date Noted  . 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 Postpartum care following cesarean delivery (1/31) 01/14/2020  . Cesarean delivery due to maternal disorder 01/13/2020  . Previous cesarean delivery affecting pregnancy 01/13/2020  . Gestational hypertension 10/01/2014  . Obesity 08/14/2013                                                                Post partum procedures:none   Pain control: Spinal  Complications: None   Hospital course:  Sceduled C/S   34 y.o. yo [id number] at [redacted]w[redacted]d was admitted to the hospital 01/13/2020 for scheduled cesarean section with the following indication:Elective Repeat and gestational hypertension.  Membrane Rupture Time/Date: 11:37 AM ,01/13/2020   Patient delivered a Viable infant.01/13/2020  Details of operation can be found in separate operative note.  Pateint had an uncomplicated postpartum course.  She is ambulating, tolerating a regular diet, passing flatus, and urinating well. Patient is discharged home in stable condition on  01/15/20         Physical exam  Vitals:   01/14/20 2045 01/15/20 0603 01/15/20 0916 01/15/20 0917  BP: 130/88 135/87 (!) 131/94 (!) 131/94  Pulse: 80 76  86  Resp: 18 18    Temp: 97.8 F (36.6 C) 97.9 F (36.6  C)    TempSrc: Oral Oral    SpO2: 97% 98%    Weight:      Height:       General: alert, cooperative and no distress Lochia: appropriate Uterine Fundus: firm Incision: Healing well with no significant drainage DVT Evaluation: No cords or calf tenderness. Calf/Ankle edema is present Labs: Lab Results  Component Value Date   WBC 13.2 (H) 01/14/2020   HGB 11.2 (L) 01/14/2020   HCT 32.2 (L) 01/14/2020   MCV 90.7 01/14/2020   PLT 210 01/14/2020   CMP Latest Ref Rng & Units 01/12/2020  Glucose 70 - 99 mg/dL 88  BUN 6 - 20 mg/dL <5(L)  Creatinine 0.44 - 1.00 mg/dL 0.52  Sodium 135 - 145 mmol/L 136  Potassium 3.5 - 5.1 mmol/L 3.6  Chloride 98 - 111 mmol/L 103  CO2 22 - 32 mmol/L 21(L)  Calcium 8.9 - 10.3 mg/dL 8.7(L)  Total Protein 6.5 - 8.1 g/dL 5.7(L)  Total Bilirubin 0.3 - 1.2 mg/dL 0.4  Alkaline Phos 38 - 126 U/L 135(H)  AST 15 - 41 U/L 19  ALT 0 - 44 U/L 13    Vaccines: TDaP UTD         Flu    UTD  Discharge instruction: per After Visit Summary and "Baby and Me Booklet".  After Visit Meds:  Allergies as of 01/15/2020  Reactions   Nsaids Anaphylaxis, Hives, Swelling   " all nsaids"   Salicylates Anaphylaxis, Hives, Swelling   Gluten Meal    Lactose Intolerance (gi)    Oxycodone Hives   Wheat Bran Hives   " all wheat products"      Medication List    STOP taking these medications   doxylamine (Sleep) 25 MG tablet Commonly known as: UNISOM     TAKE these medications   acetaminophen 500 MG tablet Commonly known as: TYLENOL Take 2 tablets (1,000 mg total) by mouth every 6 (six) hours. What changed:   medication strength  how much to take  when to take this  reasons to take this   coconut oil Oil Apply 1 application topically as needed.   labetalol 100 MG tablet Commonly known as: NORMODYNE Take 100 mg by mouth 2 (two) times daily.   levothyroxine 150 MCG tablet Commonly known as: SYNTHROID Take 150 mcg by mouth daily before breakfast.    prenatal multivitamin Tabs tablet Take 1 tablet by mouth at bedtime.   senna-docusate 8.6-50 MG tablet Commonly known as: Senokot-S Take 2 tablets by mouth daily. Start taking on: January 16, 2020   simethicone 80 MG chewable tablet Commonly known as: MYLICON Chew 1 tablet (80 mg total) by mouth as needed for flatulence.   traMADol 50 MG tablet Commonly known as: ULTRAM Take 1 tablet (50 mg total) by mouth every 6 (six) hours as needed for severe pain.       Diet: routine diet  Activity: Advance as tolerated. Pelvic rest for 6 weeks.   Postpartum contraception: Not Discussed  Newborn Data: Live born female  Birth Weight: 7 lb 6.8 oz (3368 g) APGAR: 9, 9  Newborn Delivery   Birth date/time: 01/13/2020 11:37:00 Delivery type: C-Section, Low Transverse Trial of labor: No C-section categorization: Repeat      named Samuel Bouche Baby Feeding: Breast Disposition:home with mother   Delivery Report:   Review the Delivery Report for details.    Follow up: Follow-up Information    Olivia Mackie, MD. Schedule an appointment as soon as possible for a visit in 1 week(s).   Specialty: Obstetrics and Gynecology Why: BP check Contact information: [address] [place] [address] [phone]             Signed: Neta Mends, CNM, MSN 01/15/2020, 9:47 AM

## 2020-01-15 NOTE — Lactation Note (Signed)
This note was copied from a baby's chart. Lactation Consultation Note  Patient Name: Boy Taygen Newsome [id number] Date: 01/15/2020 Reason for consult: Follow-up assessment;Early term 37-38.6wks;Infant weight loss;Other (Comment)(9 % weight loss) Type of Endocrine Disorder?: Thyroid Baby is 47 hours  Per mom baby recently fed at 1005 for 15 mins with swallows, and donor milk 12.5 ml  With curved tip syringe.  [place] praised mom and dad for following the [facility] plan.  LC recommended since the baby is at 9 % weight loss, to switch either to supplementing at the breast with 47F SNS up to 30 ml or to supplement with medium based nipple after breast feeding.  LC provided with instructions the 5 F SNS, with 12 ml syringes or the 35 ml syringe.  Mom mentioned she is getting more drops with hand expressing, but has a lot of edema. LC offered to check her breast tissue to see if she would benefit from shells for reverse pressure and a deeper latch.  LC noted areola edema, mom denied sore nipples.  Sore nipple and engorgement prevention and tx reviewed.  Mom has [place] and the DEBP kit and is aware there is a hand pump and LC reviewed how it works .  LC recommended checking with her [place] office regarding a [place] services for F/U.  And if not available to call [place].    Maternal Data    Feeding Feeding Type: Donor Breast Milk  LATCH Score                   Interventions Interventions: Breast feeding basics reviewed  Lactation Tools Discussed/Used     Consult Status Consult Status: Complete Date: 01/15/20    Myer Haff 01/15/2020, 11:08 AM

## 2020-01-25 DIAGNOSIS — O135 Gestational [pregnancy-induced] hypertension without significant proteinuria, complicating the puerperium: Secondary | ICD-10-CM | POA: Diagnosis not present

## 2020-01-25 MED FILL — LABETALOL HCL 200 MG TABS: 200 | 30 days supply | Qty: 60 | Fill #0

## 2020-02-01 DIAGNOSIS — O135 Gestational [pregnancy-induced] hypertension without significant proteinuria, complicating the puerperium: Secondary | ICD-10-CM | POA: Diagnosis not present

## 2020-02-03 LAB — CBC AND DIFFERENTIAL
HCT: 41 (ref 36–46)
Hemoglobin: 13.7 (ref 12.0–16.0)
Platelets: 411 — AB (ref 150–399)
WBC: 7.6

## 2020-02-03 LAB — BASIC METABOLIC PANEL
BUN: 7 (ref 4–21)
CO2: 18 (ref 13–22)
Chloride: 106 (ref 99–108)
Creatinine: 0.7 (ref 0.5–1.1)
Glucose: 97
Potassium: 4.4 (ref 3.4–5.3)
Sodium: 142 (ref 137–147)

## 2020-02-03 LAB — HEPATIC FUNCTION PANEL
ALT: 16 (ref 7–35)
AST: 19 (ref 13–35)
Alkaline Phosphatase: 119 (ref 25–125)
Bilirubin, Total: 0.3

## 2020-02-03 LAB — COMPREHENSIVE METABOLIC PANEL
Albumin: 4.1 (ref 3.5–5.0)
Calcium: 9.2 (ref 8.7–10.7)
GFR calc Af Amer: 123
GFR calc non Af Amer: 107
Globulin: 2.9

## 2020-02-03 LAB — CBC: RBC: 4.48 (ref 3.87–5.11)

## 2020-02-14 IMAGING — CR DG CHEST 2V
2 series · 2 of 2 positions shown · non-contrast
Comparison: None.

CLINICAL DATA: Preoperative study prior thyroid surgery.

EXAM:
CHEST - 2 VIEW

[w chest pa]
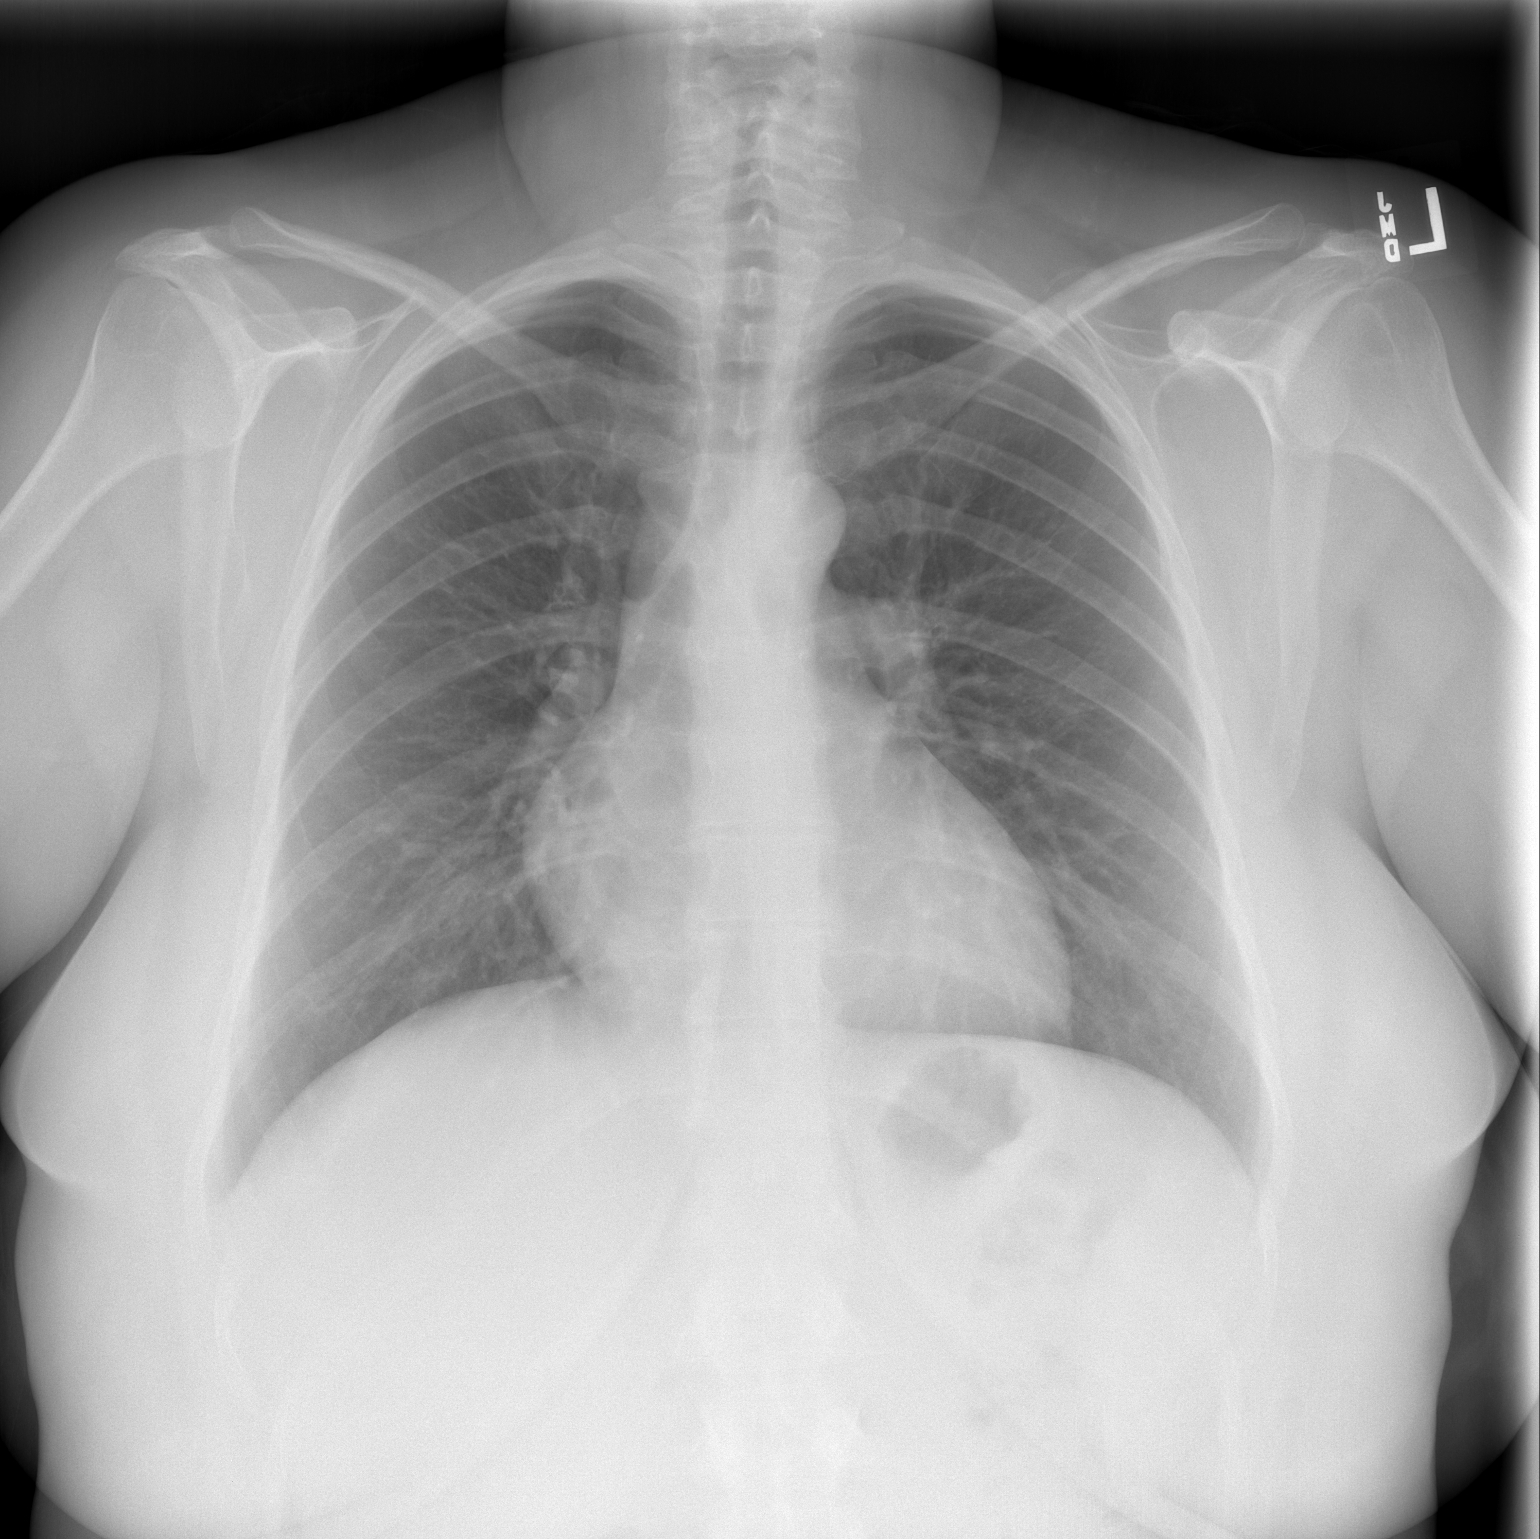

[w chest lat]
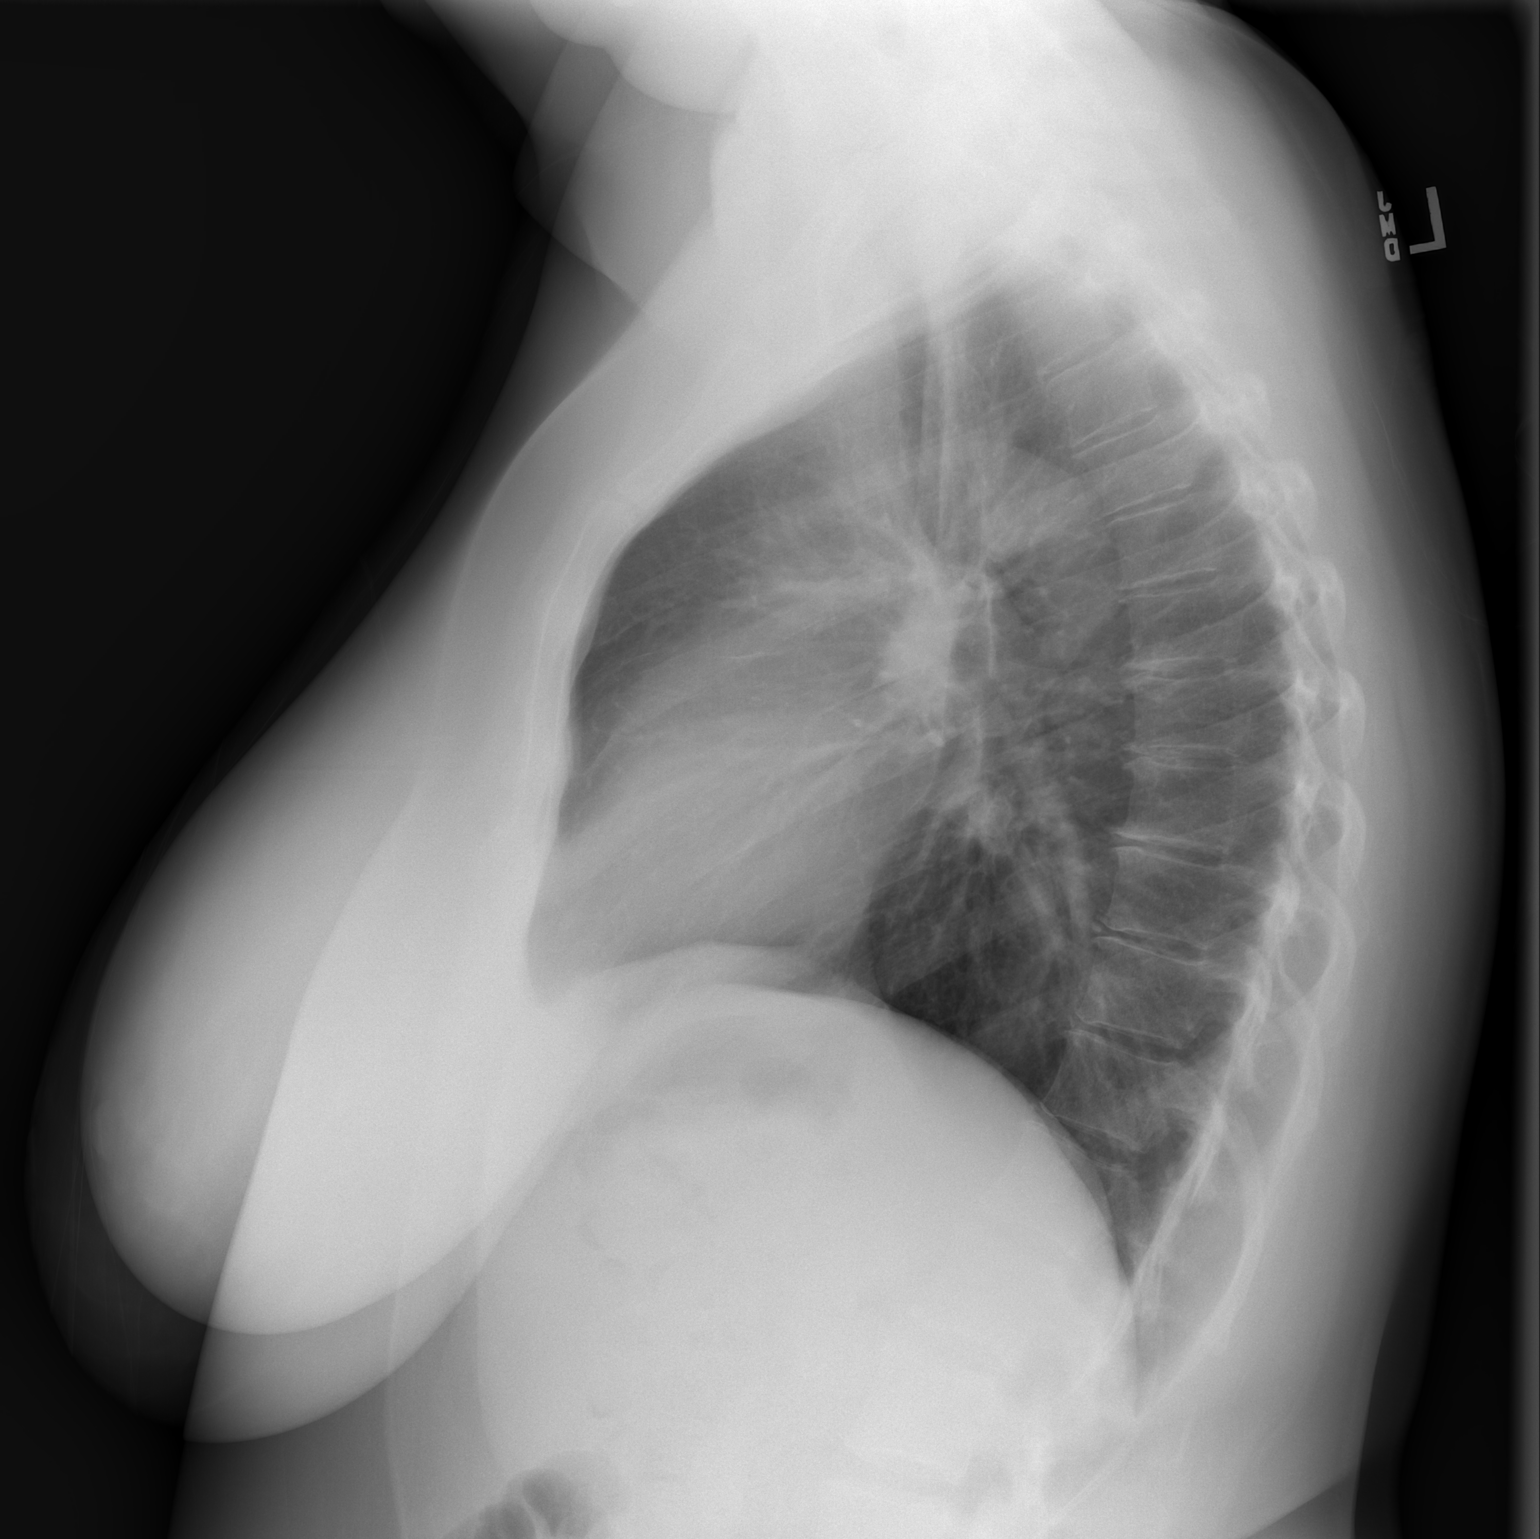

[2 of 2 positions shown; findings below may reference images not displayed]

FINDINGS: The heart size and mediastinal contours are within normal limits.
Both lungs are clear. The visualized skeletal structures are
unremarkable.
IMPRESSION: No active cardiopulmonary disease.

## 2020-02-26 DIAGNOSIS — E890 Postprocedural hypothyroidism: Secondary | ICD-10-CM | POA: Diagnosis not present

## 2020-02-26 LAB — HM PAP SMEAR: HM Pap smear: NORMAL

## 2020-02-26 MED FILL — NORETHINDRONE 0.35 MG TAB: 0.35 | 28 days supply | Qty: 28 | Fill #0

## 2020-02-28 MED FILL — LEVOTHYROXINE 137 MCG TABLE: 137 | 30 days supply | Qty: 30 | Fill #0

## 2020-03-18 DIAGNOSIS — I10 Essential (primary) hypertension: Secondary | ICD-10-CM | POA: Diagnosis not present

## 2020-03-31 MED FILL — NORETHINDRONE 0.35 MG TAB: 0.35 | 28 days supply | Qty: 28 | Fill #1

## 2020-03-31 MED FILL — LEVOTHYROXINE 137 MCG TABLE: 137 | 90 days supply | Qty: 90 | Fill #0

## 2020-04-01 ENCOUNTER — Emergency Department (INDEPENDENT_AMBULATORY_CARE_PROVIDER_SITE_OTHER): Payer: 59

## 2020-04-01 ENCOUNTER — Emergency Department (INDEPENDENT_AMBULATORY_CARE_PROVIDER_SITE_OTHER)
Admission: EM | Admit: 2020-04-01 | Discharge: 2020-04-01 | Disposition: A | Discharge status: Home / Self Care | Payer: 59 | Source: Home / Self Care | Attending: Family Medicine | Admitting: Family Medicine

## 2020-04-01 ENCOUNTER — Other Ambulatory Visit: Payer: Self-pay

## 2020-04-01 DIAGNOSIS — M7752 Other enthesopathy of left foot: Secondary | ICD-10-CM | POA: Diagnosis not present

## 2020-04-01 DIAGNOSIS — M79672 Pain in left foot: Secondary | ICD-10-CM | POA: Diagnosis not present

## 2020-04-01 DIAGNOSIS — M7662 Achilles tendinitis, left leg: Secondary | ICD-10-CM

## 2020-04-01 NOTE — Discharge Instructions (Addendum)
Apply ice pack for 20 to 30 minutes, 3 to 4 times daily  Continue until pain and swelling decrease.  Begin range of motion and stretching exercises as tolerated.  May take Tylenol as needed for pain. Wear high quality supportive athletic shoes.

## 2020-04-01 NOTE — ED Triage Notes (Signed)
Patient presents to Urgent Care with complaints of left ankle pain near her achilles tendon since about 5 days ago. Patient reports it has gradually gotten more and more painful. Pt states it only hurts with movement. Pt denies known injury.

## 2020-04-01 NOTE — ED Provider Notes (Signed)
[facility]    CSN: [id number] Arrival date & time: 04/01/20  1456      History   Chief Complaint Chief Complaint  Patient presents with  . Ankle Pain    HPI Donna Tucker is a 34 y.o. female.   Patient complains of pain in her posterior heel for about 6 weeks, worse for about 5 days.  She has pain with dorsiflexion and plantar flexion of her left foot.  She recalls no injury, change in activities, or new shoes.  The history is provided by the patient.  Ankle Pain Location:  Ankle Time since incident:  5 days Injury: no   Ankle location:  L ankle Pain details:    Quality:  Aching   Radiates to:  Does not radiate   Severity:  Moderate   Onset quality:  Gradual   Duration:  6 weeks   Timing:  Constant   Progression:  Worsening Chronicity:  New Prior injury to area:  No Relieved by:  None tried Worsened by:  Activity Ineffective treatments:  None tried Associated symptoms: no decreased ROM, no muscle weakness, no stiffness and no swelling   Risk factors: obesity     Past Medical History:  Diagnosis Date  . Complication of anesthesia    post partum itching  . Gluten intolerance   . Graves' disease    endocrinologist--- dr Chalmers Cater-- dx 2012  . Hx of pre-eclampsia in prior pregnancy, currently pregnant   . Hx of varicella   . Hypothyroidism   . Pregnancy induced hypertension   . Tachycardia   . Wears glasses   . Wheat allergy     Patient Active Problem List   Diagnosis Date Noted  . 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 Postpartum care following cesarean delivery (1/31) 01/14/2020  . Cesarean delivery due to maternal disorder 01/13/2020  . Previous cesarean delivery affecting pregnancy 01/13/2020  . Gestational hypertension 10/01/2014  . Obesity 08/14/2013    Past Surgical History:  Procedure Laterality Date  . CESAREAN SECTION N/A 10/02/2014   Procedure: CESAREAN SECTION;  Surgeon: Lovenia Kim, MD;  Location: [facility];  Service:  Obstetrics;  Laterality: N/A;  . CESAREAN SECTION N/A 01/13/2020   Procedure: Repeat CESAREAN SECTION;  Surgeon: Brien Few, MD;  Location: [facility];  Service: Obstetrics;  Laterality: N/A;  EDD: 02/03/20  . THYROIDECTOMY N/A 11/02/2018   Procedure: TOTAL THYROIDECTOMY;  Surgeon: Armandina Gemma, MD;  Location: [facility];  Service: General;  Laterality: N/A;    OB History    Gravida  4   Para  2   Term  2   Preterm      AB  2   Living  2     SAB  1   TAB      Ectopic  1   Multiple  0   Live Births  2            Home Medications    Prior to Admission medications   Medication Sig Start Date End Date Taking? Authorizing Provider  acetaminophen (TYLENOL) 500 MG tablet Take 2 tablets (1,000 mg total) by mouth every 6 (six) hours. 01/15/20   Juliene Pina, CNM  coconut oil OIL Apply 1 application topically as needed. 01/15/20   Juliene Pina, CNM  labetalol (NORMODYNE) 100 MG tablet Take 100 mg by mouth 2 (two) times daily.    [provider]  levothyroxine (SYNTHROID) 150 MCG tablet Take 150 mcg by mouth daily  before breakfast.    [provider]  Prenatal Vit-Fe Fumarate-FA (PRENATAL MULTIVITAMIN) TABS tablet Take 1 tablet by mouth at bedtime.    [provider]  senna-docusate (SENOKOT-S) 8.6-50 MG tablet Take 2 tablets by mouth daily. 01/16/20   Neta Mends, CNM  simethicone (MYLICON) 80 MG chewable tablet Chew 1 tablet (80 mg total) by mouth as needed for flatulence. 01/15/20   Neta Mends, CNM  traMADol (ULTRAM) 50 MG tablet Take 1 tablet (50 mg total) by mouth every 6 (six) hours as needed for severe pain. 01/15/20   Neta Mends, CNM    Family History Family History  Problem Relation Age of Onset  . Diabetes Mother        T1DM  . Stroke Mother   . Hypothyroidism Mother   . Hypertension Father   . Graves' disease Maternal Aunt   . Cancer Maternal Aunt         breast  . Cancer Maternal Grandmother   . Graves' disease Cousin    . Cancer Cousin   . Depression Brother   . Graves' disease Paternal Grandfather     Social History Social History   Tobacco Use  . Smoking status: Never Smoker  . Smokeless tobacco: Never Used  Substance Use Topics  . Alcohol use: Not Currently    Comment: Not since pregnancy  . Drug use: Never     Allergies   Nsaids, Salicylates, Gluten meal, Lactose intolerance (gi), Oxycodone, and Wheat bran   Review of Systems Review of Systems  Musculoskeletal: Negative for stiffness.       Left heel pain  All other systems reviewed and are negative.    Physical Exam Triage Vital Signs ED Triage Vitals  Enc Vitals Group     BP 04/01/20 1508 132/90     Pulse Rate 04/01/20 1508 95     Resp 04/01/20 1508 16     Temp 04/01/20 1508 98 F (36.7 C)     Temp Source 04/01/20 1508 Oral     SpO2 04/01/20 1508 100 %     Weight --      Height --      Head Circumference --      Peak Flow --      Pain Score 04/01/20 1506 1     Pain Loc --      Pain Edu? --      Excl. in GC? --    No data found.  Updated Vital Signs BP 132/90 (BP Location: Right Arm)   Pulse 95   Temp 98 F (36.7 C) (Oral)   Resp 16   SpO2 100%   Breastfeeding Yes   Visual Acuity Right Eye Distance:   Left Eye Distance:   Bilateral Distance:    Right Eye Near:   Left Eye Near:    Bilateral Near:     Physical Exam Vitals and nursing note reviewed.  Constitutional:      General: She is not in acute distress.    Appearance: She is obese.  Eyes:     Pupils: Pupils are equal, round, and reactive to light.  Cardiovascular:     Rate and Rhythm: Normal rate.  Pulmonary:     Effort: Pulmonary effort is normal.  Musculoskeletal:     Left foot: Normal range of motion. Tenderness and bony tenderness present.       Feet:     Comments: Negative Thompson's test left lower leg. The left achilles tendon  has tenderness to bilateral compression of the retrocalcaneal bursa.  There is no tenderness over the  subcutaneous bursa.  There is tenderness at the insertion of the achilles tendon.    Skin:    General: Skin is warm and dry.  Neurological:     Mental Status: She is alert.      UC Treatments / Results  Labs (all labs ordered are listed, but only abnormal results are displayed) Labs Reviewed - No data to display  EKG   Radiology DG Os Calcis Left  Result Date: 04/01/2020 CLINICAL DATA:  Pain over the insertion of the Achilles tendon EXAM: LEFT OS CALCIS - 2+ VIEW COMPARISON:  None. FINDINGS: There is no evidence of fracture or other focal bone lesions. There appears to be thickening of the distal Achilles tendon which appears to be intact on the posterior calcaneus. There is mild edema seen within the retrocalcaneal fat pad. IMPRESSION: No acute osseous abnormality. There appears to be slight thickening of the distal Achilles which could be due to Achilles tendinosis, however it appears to be intact. Electronically Signed   By: Jonna Clark M.D.   On: 04/01/2020 16:11    Procedures Procedures (including critical care time)  Medications Ordered in UC Medications - No data to display  Initial Impression / Assessment and Plan / UC Course  I have reviewed the triage vital signs and the nursing notes.  Pertinent labs & imaging results that were available during my care of the patient were reviewed by me and considered in my medical decision making (see chart for details).    Given achilles tendonitis rehabilitation instructions with range of motion and stretching exercises.  Note that patient is not able to take NSAID's. Followup with Dr. [facility] ([facility]) if not improving about two weeks.     Final Clinical Impressions(s) / UC Diagnoses   Final diagnoses:  Left calcaneal bursitis  Tendonitis, Achilles, left     Discharge Instructions     Apply ice pack for 20 to 30 minutes, 3 to 4 times daily  Continue until pain and swelling decrease.  Begin  range of motion and stretching exercises as tolerated.  May take Tylenol as needed for pain. Wear high quality supportive athletic shoes.    ED Prescriptions    None     PDMP not reviewed this encounter.   Lattie Haw, MD 04/01/20 [phone]

## 2020-04-07 MED FILL — SERTRALINE HCL 25 MG TABLET: 25 | 30 days supply | Qty: 30 | Fill #0

## 2020-04-15 DIAGNOSIS — H52223 Regular astigmatism, bilateral: Secondary | ICD-10-CM | POA: Diagnosis not present

## 2020-04-24 DIAGNOSIS — F411 Generalized anxiety disorder: Secondary | ICD-10-CM | POA: Diagnosis not present

## 2020-04-30 DIAGNOSIS — F411 Generalized anxiety disorder: Secondary | ICD-10-CM | POA: Diagnosis not present

## 2020-05-01 DIAGNOSIS — E890 Postprocedural hypothyroidism: Secondary | ICD-10-CM | POA: Diagnosis not present

## 2020-05-01 DIAGNOSIS — I10 Essential (primary) hypertension: Secondary | ICD-10-CM | POA: Diagnosis not present

## 2020-05-02 MED FILL — LEVOTHYROXINE 125 MCG TABLE: 125 | 30 days supply | Qty: 30 | Fill #0

## 2020-05-07 DIAGNOSIS — F411 Generalized anxiety disorder: Secondary | ICD-10-CM | POA: Diagnosis not present

## 2020-05-14 DIAGNOSIS — F411 Generalized anxiety disorder: Secondary | ICD-10-CM | POA: Diagnosis not present

## 2020-05-20 DIAGNOSIS — F411 Generalized anxiety disorder: Secondary | ICD-10-CM | POA: Diagnosis not present

## 2020-05-27 DIAGNOSIS — F411 Generalized anxiety disorder: Secondary | ICD-10-CM | POA: Diagnosis not present

## 2020-05-30 MED FILL — SERTRALINE HCL 25 MG TABLET: 25 | 30 days supply | Qty: 30 | Fill #2

## 2020-05-30 MED FILL — LEVOTHYROXINE 125 MCG TABLE: 125 | 90 days supply | Qty: 90 | Fill #1

## 2020-05-30 MED FILL — NORETHINDRONE 0.35 MG TAB: 0.35 | 84 days supply | Qty: 84 | Fill #3

## 2020-06-03 ENCOUNTER — Other Ambulatory Visit: Payer: Self-pay

## 2020-06-03 ENCOUNTER — Ambulatory Visit (INDEPENDENT_AMBULATORY_CARE_PROVIDER_SITE_OTHER): Payer: 59 | Admitting: Sports Medicine

## 2020-06-03 ENCOUNTER — Encounter: Payer: Self-pay | Admitting: Sports Medicine

## 2020-06-03 DIAGNOSIS — M7662 Achilles tendinitis, left leg: Secondary | ICD-10-CM | POA: Insufficient documentation

## 2020-06-03 MED ORDER — NITROGLYCERIN 0.2 MG/HR TD PT24: MEDICATED_PATCH | TRANSDERMAL | 11 refills | Status: DC

## 2020-06-03 MED FILL — NITROGLYCERIN 0.2 MG/HR PTC: 0.2 | 30 days supply | Qty: 8 | Fill #0

## 2020-06-03 NOTE — Assessment & Plan Note (Signed)
This is a very pleasant 34 year old female, she is about 6 months postpartum, for some time now she said pain in the posterior heel, mid Achilles. On exam she has no Achilles nodule, negative Thompson's test, negative Homans' sign. We will treat her conservatively with topical nitroglycerin, heel lifts, eccentric rehab and physical therapy, return to see me in 4 weeks, I did explain that it could take up to 8 weeks or longer for this to heal.

## 2020-06-03 NOTE — Progress Notes (Signed)
    Procedures performed today:    None.  Independent interpretation of notes and tests performed by another provider:   None.  Brief History, Exam, Impression, and Recommendations:    Left Achilles tendinitis This is a very pleasant 34 year old female, she is about 6 months postpartum, for some time now she said pain in the posterior heel, mid Achilles. On exam she has no Achilles nodule, negative Thompson's test, negative Homans' sign. We will treat her conservatively with topical nitroglycerin, heel lifts, eccentric rehab and physical therapy, return to see me in 4 weeks, I did explain that it could take up to 8 weeks or longer for this to heal.    ___________________________________________ Ihor Austin. Benjamin Stain, M.D., ABFM., CAQSM. Primary Care and Sports Medicine [facility]  Adjunct Instructor of Family Medicine  [employer]

## 2020-06-09 ENCOUNTER — Ambulatory Visit (INDEPENDENT_AMBULATORY_CARE_PROVIDER_SITE_OTHER): Payer: 59 | Admitting: Rehabilitative and Restorative Service Providers"

## 2020-06-09 ENCOUNTER — Other Ambulatory Visit: Payer: Self-pay

## 2020-06-09 ENCOUNTER — Encounter: Payer: Self-pay | Admitting: Rehabilitative and Restorative Service Providers"

## 2020-06-09 DIAGNOSIS — R2689 Other abnormalities of gait and mobility: Secondary | ICD-10-CM

## 2020-06-09 DIAGNOSIS — R29898 Other symptoms and signs involving the musculoskeletal system: Secondary | ICD-10-CM

## 2020-06-09 DIAGNOSIS — M7662 Achilles tendinitis, left leg: Secondary | ICD-10-CM

## 2020-06-09 NOTE — Addendum Note (Signed)
Addended by: Val Riles on: 06/09/2020 05:20 PM   Modules accepted: Orders

## 2020-06-09 NOTE — Therapy (Addendum)
[place], [place], [address] Phone: [phone]   Fax:  [phone]  Physical Therapy Evaluation  Patient Details  Name: Donna Tucker MRN: [id number] Date of Birth: 07-14-1986 Referring Provider (PT): Dr Dianah Field    Encounter Date: 06/09/2020   PT End of Session - 06/09/20 1327    Visit Number 1    Number of Visits 12    Date for PT Re-Evaluation 07/21/20    PT Start Time 1025    PT Stop Time 0939    PT Time Calculation (min) 52 min    Activity Tolerance Patient tolerated treatment well           Past Medical History:  Diagnosis Date  . Complication of anesthesia    post partum itching  . Gluten intolerance   . Graves' disease    endocrinologist--- dr Chalmers Cater-- dx 2012  . Hx of pre-eclampsia in prior pregnancy, currently pregnant   . Hx of varicella   . Hypothyroidism   . Pregnancy induced hypertension   . Tachycardia   . Wears glasses   . Wheat allergy     Past Surgical History:  Procedure Laterality Date  . CESAREAN SECTION N/A 10/02/2014   Procedure: CESAREAN SECTION;  Surgeon: Lovenia Kim, MD;  Location: [facility];  Service: Obstetrics;  Laterality: N/A;  . CESAREAN SECTION N/A 01/13/2020   Procedure: Repeat CESAREAN SECTION;  Surgeon: Brien Few, MD;  Location: [facility];  Service: Obstetrics;  Laterality: N/A;  EDD: 02/03/20  . THYROIDECTOMY N/A 11/02/2018   Procedure: TOTAL THYROIDECTOMY;  Surgeon: Armandina Gemma, MD;  Location: [facility];  Service: General;  Laterality: N/A;    There were no vitals filed for this visit.    Subjective Assessment - 06/09/20 0900    Subjective Patient reports onset of Lt heel pain 02/10/20 after she was pushing up on a ledge to get into bed ~ 1 month after the birth of her son. Symptoms have persisted with variable intensity since onset and seem to be getting worse in the last month.    Pertinent History C-sections x 2    Patient  Stated Goals get back to running - and get rid of pain so she does not have to limit activities (last ran ~ 6/20 and was running ~ 5 miles - has run half marathons in the past)    Currently in Pain? Yes    Pain Score 1     Pain Location Heel    Pain Orientation Left    Pain Descriptors / Indicators Dull   sharp when on an incline   Pain Type Acute pain    Pain Radiating Towards sometimes into the calf    Pain Onset More than a month ago    Pain Frequency Intermittent    Aggravating Factors  weight bearing; worst with descending stairs; waling on unlevel surfaces    Pain Relieving Factors avoiding activities that irritate symptoms              [facility] PT Assessment - 06/09/20 0001      Assessment   Medical Diagnosis Lt Achille's tendinitis     Referring Provider (PT) Dr Dianah Field     Onset Date/Surgical Date 02/10/20    Hand Dominance Right    Next MD Visit 07/01/20    Prior Therapy none       Precautions   Precautions None      Restrictions   Weight Bearing  Restrictions No      Balance Screen   Has the patient fallen in the past 6 months No    Has the patient had a decrease in activity level because of a fear of falling?  No    Is the patient reluctant to leave their home because of a fear of falling?  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     Leisure 24 month old 34 yr old and 34 yr old; household chores; Pharmacologist; running (not running currently); sewing;  gardening       Observation/Other Assessments   Focus on Therapeutic Outcomes (FOTO)  57% limitation       Sensation   Additional Comments WFL's per pt report       Posture/Postural Control   Posture Comments flexed forward at hips; shoulders rounded       AROM   Right/Left Hip --   WFL's bilat    Right/Left Knee --   WFL's bilat    Right/Left Ankle --   equal/WFL's bilat except limited ankle DF Lt compared to Rt      Strength   Right Hip Flexion 4+/5    Right Hip Extension 4+/5    Right Hip ABduction 5/5    Left Hip Flexion 4+/5    Left Hip Extension 4+/5    Left Hip ABduction 5/5    Right/Left Knee --   5/5 bilat    Left Ankle Dorsiflexion 5/5    Left Ankle Plantar Flexion 3+/5    Left Ankle Inversion 5/5    Left Ankle Eversion 5/5      Flexibility   Hamstrings WFL's tight end ranges     Quadriceps WFL's tight end ranges     ITB WFL's tight end ranges     Piriformis tight       Palpation   Palpation comment muscular tightness Lt posterior calf into posterior calcaneous       Ambulation/Gait   Gait Comments antalgic gait with lip on Lt LE in wt bearing Lt       Balance   Balance Assessed --   ~10 sec painful on Lt                      Objective measurements completed on examination: See above findings.       [facility] Adult PT Treatment/Exercise - 06/09/20 0001      Self-Care   Self-Care --   education re-tissue healing; use of ice; activities to avoid     Cryotherapy   Number Minutes Cryotherapy 10 Minutes    Cryotherapy Location Ankle;Other (comment)   heel Lt      Ankle Exercises: Stretches   Soleus Stretch 2 reps;30 seconds   standing    Gastroc Stretch 2 reps;30 seconds   seated with strap; standing  Ankle Exercises: Seated   Ankle Circles/Pumps Left;AROM;10 reps                  PT Education - 06/09/20 1337    Education Details HEP    Person(s) Educated Patient    Methods Explanation;Demonstration;Tactile cues;Verbal  cues;Handout    Comprehension Verbalized understanding;Returned demonstration;Verbal cues required;Tactile cues required               PT Long Term Goals - 06/09/20 1338      PT LONG TERM GOAL #1   Title Patient demonstrate full pain free ankle Lt ROM    Time 6    Period Weeks    Status New    Target Date 07/21/20      PT LONG TERM GOAL #2   Title Normal gait pattern with no imp    Time 6    Period Weeks    Status New    Target Date 07/21/20      PT LONG TERM GOAL #3   Title 5/5 strength Lt plantar flexion with no pain with resistive testing    Time 5    Period Weeks    Status New    Target Date 07/21/20      PT LONG TERM GOAL #4   Title return to walking and/or running program as tolerated for ~ 15-20 min with minimal to no increase in pain    Time 6    Period Weeks    Status New    Target Date 07/21/20      PT LONG TERM GOAL #5   Title Independent in HEP    Time 6    Period Weeks    Status New    Target Date 07/21/20      Additional Long Term Goals   Additional Long Term Goals Yes      PT LONG TERM GOAL #6   Title Iprove FOTO to </= 34% limitation    Time 6    Period Weeks    Status New    Target Date 07/21/20                  Plan - 06/09/20 1328    Clinical Impression Statement Patient presents with ~ 5-6 month history of Lt heel pain. She remembers irritationg her heel stepping up onto a platform to get into bed. She has had persistent pain in the lt heel since that time. She has limtied ankle DF; decreased strength with pain with resistive testing; abnormal gait pattern and pain limiting functional activities. She will benefit fro PT to address problems identified.    Stability/Clinical Decision Making Stable/Uncomplicated    Clinical Decision Making Low    Rehab Potential Good    PT Frequency 2x / week    PT Duration 6 weeks    PT Treatment/Interventions Patient/family education;ADLs/Self Care Home Management;Aquatic  Therapy;Cryotherapy;Electrical Stimulation;Iontophoresis 4mg /ml Dexamethasone;Moist Heat;Ultrasound;Gait training;Stair training;Functional mobility training;Therapeutic activities;Therapeutic exercise;Balance training;Neuromuscular re-education;Manual techniques;Passive range of motion;Dry needling;Taping;Vasopneumatic Device    PT Next Visit Plan review HEP; add manual work through calf; trial of ionto(not discussed); decrease pain before progressing with strengthening; modalities as indicated           Patient will benefit from skilled therapeutic intervention in order to improve the following deficits and impairments:  Abnormal gait, Decreased range of motion, Pain, Hypomobility, Impaired flexibility, Improper body mechanics, Decreased mobility, Decreased strength  Visit Diagnosis: Achilles tendinitis of left lower extremity - Plan: PT plan of  care cert/re-cert  Other symptoms and signs involving the musculoskeletal system - Plan: PT plan of care cert/re-cert  Other abnormalities of gait and mobility - Plan: PT plan of care cert/re-cert     Problem List Patient Active Problem List   Diagnosis Date Noted  . Left Achilles tendinitis 06/03/2020  . 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 Postpartum care following cesarean delivery (1/31) 01/14/2020  . Cesarean delivery due to maternal disorder 01/13/2020  . Previous cesarean delivery affecting pregnancy 01/13/2020  . Gestational hypertension 10/01/2014  . Obesity 08/14/2013    Andreka Stucki Rober Minion PT, MPH  06/09/2020, 5:18 PM  [facility] [address] [place], [place], [address] Phone: [phone]   Fax:  [phone]  Name: JAMICE CARRENO MRN: [id number] Date of Birth: February 22, 1986

## 2020-06-09 NOTE — Patient Instructions (Signed)
Access Code: [email or web address]: [email or web address]: 06/28/2021Prepared by: Jalyssa Fleisher HoltExercises  Supine Calf Stretch with Strap - 2 x daily - 7 x weekly - 1 sets - 3 reps - 30 sec hold  Long Sitting Calf Stretch with Strap - 2 x daily - 7 x weekly - 1 sets - 3 reps - 30 sec hold  Gastroc Stretch on Wall - 2 x daily - 7 x weekly - 1 sets - 3 reps - 30 sec hold  Soleus Stretch on Wall - 2 x daily - 7 x weekly - 1 sets - 3 reps - 30 sec hold  Supine Ankle Circles - 2 x daily - 7 x weekly - 1-2 sets - 10 reps  Seated Ankle Pumps - 2 x daily - 7 x weekly - 1-2 sets - 10 reps

## 2020-06-12 ENCOUNTER — Ambulatory Visit (INDEPENDENT_AMBULATORY_CARE_PROVIDER_SITE_OTHER): Payer: 59 | Admitting: Physical Therapy

## 2020-06-12 ENCOUNTER — Other Ambulatory Visit: Payer: Self-pay

## 2020-06-12 ENCOUNTER — Encounter: Payer: Self-pay | Admitting: Physical Therapy

## 2020-06-12 DIAGNOSIS — M7662 Achilles tendinitis, left leg: Secondary | ICD-10-CM

## 2020-06-12 DIAGNOSIS — R29898 Other symptoms and signs involving the musculoskeletal system: Secondary | ICD-10-CM

## 2020-06-12 DIAGNOSIS — R2689 Other abnormalities of gait and mobility: Secondary | ICD-10-CM

## 2020-06-12 DIAGNOSIS — F411 Generalized anxiety disorder: Secondary | ICD-10-CM | POA: Diagnosis not present

## 2020-06-12 NOTE — Therapy (Signed)
[facility] [place] [address] [place], [place], [address] Phone: [phone]   Fax:  [phone]  Physical Therapy Treatment  Patient Details  Name: Donna Tucker MRN: [id number] Date of Birth: 02-04-1986 Referring Provider (PT): Dr Benjamin Stain    Encounter Date: 06/12/2020   PT End of Session - 06/12/20 0803    Visit Number 2    Number of Visits 12    Date for PT Re-Evaluation 07/21/20    PT Start Time 0803    PT Stop Time 0844    PT Time Calculation (min) 41 min    Activity Tolerance Patient tolerated treatment well    Behavior During Therapy [facility] for tasks assessed/performed           Past Medical History:  Diagnosis Date  . Complication of anesthesia    post partum itching  . Gluten intolerance   . Graves' disease    endocrinologist--- dr Talmage Nap-- dx 2012  . Hx of pre-eclampsia in prior pregnancy, currently pregnant   . Hx of varicella   . Hypothyroidism   . Pregnancy induced hypertension   . Tachycardia   . Wears glasses   . Wheat allergy     Past Surgical History:  Procedure Laterality Date  . CESAREAN SECTION N/A 10/02/2014   Procedure: CESAREAN SECTION;  Surgeon: Lenoard Aden, MD;  Location: [facility];  Service: Obstetrics;  Laterality: N/A;  . CESAREAN SECTION N/A 01/13/2020   Procedure: Repeat CESAREAN SECTION;  Surgeon: Olivia Mackie, MD;  Location: [facility];  Service: Obstetrics;  Laterality: N/A;  EDD: 02/03/20  . THYROIDECTOMY N/A 11/02/2018   Procedure: TOTAL THYROIDECTOMY;  Surgeon: Darnell Level, MD;  Location: [facility];  Service: General;  Laterality: N/A;    There were no vitals filed for this visit.   Subjective Assessment - 06/12/20 0807    Subjective Pt reports her pain in her ankle has had less sharp pain, but it is more constant.  Pain kept her awake the night of eval, but hasn't bothered her since. She states she is very allergic to NSAIDs, but can tolerate steroids.  She is still  using nitro patch 23 hrs.    Currently in Pain? No/denies   no pain walking on flat surfaces             [facility] PT Assessment - 06/12/20 0001      Assessment   Medical Diagnosis Lt Achille's tendinitis     Referring Provider (PT) Dr Benjamin Stain     Onset Date/Surgical Date 02/10/20    Hand Dominance Right    Next MD Visit 07/01/20    Prior Therapy none             [facility] Adult PT Treatment/Exercise - 06/12/20 0001      Self-Care   Self-Care Other Self-Care Comments    Other Self-Care Comments  Pt educated on self massage with tool (IASTM) and roller stick; pt verbalized understanding.       Manual Therapy   Manual Therapy Soft tissue mobilization    Manual therapy comments to decrease fascial restrictions and improve mobility.     Soft tissue mobilization IASTM and STM to Lt post calf and medial/lateral area to  Achilles       Ankle Exercises: Stretches   Soleus Stretch 2 reps;30 seconds   standing   Gastroc Stretch 2 reps;20 seconds   standing   Other Stretch trial of seated Lt gastroc/soleus with strap to review HEP.  Ankle Exercises: Aerobic   Nustep L5: 5.5 min for warm up.       Ankle Exercises: Seated   Ankle Circles/Pumps Left;AROM;10 reps    Towel Crunch 3 reps   LLE   Towel Inversion/Eversion 3 reps                       PT Long Term Goals - 06/09/20 1338      PT LONG TERM GOAL #1   Title Patient demonstrate full pain free ankle Lt ROM    Time 6    Period Weeks    Status New    Target Date 07/21/20      PT LONG TERM GOAL #2   Title Normal gait pattern with no imp    Time 6    Period Weeks    Status New    Target Date 07/21/20      PT LONG TERM GOAL #3   Title 5/5 strength Lt plantar flexion with no pain with resistive testing    Time 5    Period Weeks    Status New    Target Date 07/21/20      PT LONG TERM GOAL #4   Title return to walking and/or running program as tolerated for ~ 15-20 min with minimal to no increase in  pain    Time 6    Period Weeks    Status New    Target Date 07/21/20      PT LONG TERM GOAL #5   Title Independent in HEP    Time 6    Period Weeks    Status New    Target Date 07/21/20      Additional Long Term Goals   Additional Long Term Goals Yes      PT LONG TERM GOAL #6   Title Iprove FOTO to </= 34% limitation    Time 6    Period Weeks    Status New    Target Date 07/21/20                 Plan - 06/12/20 0847    Clinical Impression Statement Pt tolerated exercises well today.  Palpable tightness noted in Lt soleus, gastroc, and post tib; tender with IASTM to area.  Pt trialed ascending/ descending stairs after STM and reported less pain with this activity. Pt hesitant of using ionto patch and would like to talk with pharmacist before proceeding.  Goals are ongoing.    Stability/Clinical Decision Making Stable/Uncomplicated    Rehab Potential Good    PT Frequency 2x / week    PT Duration 6 weeks    PT Treatment/Interventions Patient/family education;ADLs/Self Care Home Management;Aquatic Therapy;Cryotherapy;Electrical Stimulation;Iontophoresis 4mg /ml Dexamethasone;Moist Heat;Ultrasound;Gait training;Stair training;Functional mobility training;Therapeutic activities;Therapeutic exercise;Balance training;Neuromuscular re-education;Manual techniques;Passive range of motion;Dry needling;Taping;Vasopneumatic Device    PT Next Visit Plan Continue manual work through Lt calf; issue info on ionto; decrease pain before progressing with strengthening; modalities as indicated           Patient will benefit from skilled therapeutic intervention in order to improve the following deficits and impairments:  Abnormal gait, Decreased range of motion, Pain, Hypomobility, Impaired flexibility, Improper body mechanics, Decreased mobility, Decreased strength  Visit Diagnosis: Achilles tendinitis of left lower extremity  Other symptoms and signs involving the musculoskeletal  system  Other abnormalities of gait and mobility     Problem List Patient Active Problem List   Diagnosis Date Noted  . Left Achilles tendinitis  06/03/2020  . 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 Postpartum care following cesarean delivery (1/31) 01/14/2020  . Cesarean delivery due to maternal disorder 01/13/2020  . Previous cesarean delivery affecting pregnancy 01/13/2020  . Gestational hypertension 10/01/2014  . Obesity 08/14/2013   Mayer Camel, PTA 06/12/20 10:43 AM  [facility] [address] [place], [place], [address] Phone: [phone]   Fax:  [phone]  Name: Donna Tucker MRN: [id number] Date of Birth: March 11, 1986

## 2020-06-18 ENCOUNTER — Encounter: Payer: Self-pay | Admitting: Physical Therapy

## 2020-06-18 ENCOUNTER — Other Ambulatory Visit: Payer: Self-pay

## 2020-06-18 ENCOUNTER — Ambulatory Visit (INDEPENDENT_AMBULATORY_CARE_PROVIDER_SITE_OTHER): Payer: 59 | Admitting: Physical Therapy

## 2020-06-18 DIAGNOSIS — M7662 Achilles tendinitis, left leg: Secondary | ICD-10-CM | POA: Diagnosis not present

## 2020-06-18 DIAGNOSIS — R29898 Other symptoms and signs involving the musculoskeletal system: Secondary | ICD-10-CM | POA: Diagnosis not present

## 2020-06-18 DIAGNOSIS — R2689 Other abnormalities of gait and mobility: Secondary | ICD-10-CM | POA: Diagnosis not present

## 2020-06-18 NOTE — Therapy (Signed)
[facility] [place] [address] [place], [place], [address] Phone: [phone]   Fax:  [phone]  Physical Therapy Treatment  Patient Details  Name: Donna Tucker MRN: [id number] Date of Birth: 1986-02-22 Referring Provider (PT): Dr Benjamin Stain    Encounter Date: 06/18/2020   PT End of Session - 06/18/20 0857    Visit Number 3    Number of Visits 12    Date for PT Re-Evaluation 07/21/20    PT Start Time 0803    PT Stop Time 0845    PT Time Calculation (min) 42 min    Activity Tolerance Patient tolerated treatment well    Behavior During Therapy [facility] for tasks assessed/performed           Past Medical History:  Diagnosis Date  . Complication of anesthesia    post partum itching  . Gluten intolerance   . Graves' disease    endocrinologist--- dr Talmage Nap-- dx 2012  . Hx of pre-eclampsia in prior pregnancy, currently pregnant   . Hx of varicella   . Hypothyroidism   . Pregnancy induced hypertension   . Tachycardia   . Wears glasses   . Wheat allergy     Past Surgical History:  Procedure Laterality Date  . CESAREAN SECTION N/A 10/02/2014   Procedure: CESAREAN SECTION;  Surgeon: Lenoard Aden, MD;  Location: [facility];  Service: Obstetrics;  Laterality: N/A;  . CESAREAN SECTION N/A 01/13/2020   Procedure: Repeat CESAREAN SECTION;  Surgeon: Olivia Mackie, MD;  Location: [facility];  Service: Obstetrics;  Laterality: N/A;  EDD: 02/03/20  . THYROIDECTOMY N/A 11/02/2018   Procedure: TOTAL THYROIDECTOMY;  Surgeon: Darnell Level, MD;  Location: [facility];  Service: General;  Laterality: N/A;    There were no vitals filed for this visit.   Subjective Assessment - 06/18/20 0807    Subjective Pt reports she has increased her walking about 25%. She is having less pain in her ankle, "The more I warm it up, the less uncomfortable it is".  She has been rolling her calf every other day.  She is feeling more confident ascending  stairs, still a little uncertain descending while carrying something.  She has discomfort with heel raise to reach a tall shelf.    Patient Stated Goals get back to running - and get rid of pain so she does not have to limit activities (last ran ~ 6/20 and was running ~ 5 miles - has run half marathons in the past)    Currently in Pain? No/denies    Pain Score 0-No pain              [facility] PT Assessment - 06/18/20 0001      Assessment   Medical Diagnosis Lt Achille's tendinitis     Referring Provider (PT) Dr Benjamin Stain     Onset Date/Surgical Date 02/10/20    Hand Dominance Right    Next MD Visit 07/01/20    Prior Therapy none       AROM   Right/Left Ankle Left;Right    Right Ankle Dorsiflexion 7    Right Ankle Plantar Flexion 73    Left Ankle Dorsiflexion 10    Left Ankle Plantar Flexion 69         \     [facility] Adult PT Treatment/Exercise - 06/18/20 0001      Manual Therapy   Manual therapy comments to decrease fascial restrictions and improve mobility.     Soft tissue  mobilization IASTM and STM to Lt post calf and medial/lateral area to  Achilles       Ankle Exercises: Stretches   Soleus Stretch 2 reps;30 seconds   incline board   Gastroc Stretch 2 reps;30 seconds   incline board      Ankle Exercises: Aerobic   Nustep L5: 4.5 min for warm up (legs only)      Ankle Exercises: Standing   SLS Lt/Rt SLS on blue pad x 30 sec; repeated on LLE with horiz/ vertical head turns.   SLS forward leans to touch chair x 5 reps each side, not touching foot in between reps to challenge balance.     Other Standing Ankle Exercises step downs and retro step up with LLE x 10 reps on 6" step, light UE support on rail.                   PT Education - 06/18/20 0901    Education Details HEP    Person(s) Educated Patient    Methods Explanation;Handout;Verbal cues;Demonstration    Comprehension Verbalized understanding;Returned demonstration               PT Long Term  Goals - 06/18/20 0902      PT LONG TERM GOAL #1   Title Patient demonstrate full pain free ankle Lt ROM    Time 6    Period Weeks    Status On-going      PT LONG TERM GOAL #2   Title Normal gait pattern with no imp    Time 6    Period Weeks    Status On-going      PT LONG TERM GOAL #3   Title 5/5 strength Lt plantar flexion with no pain with resistive testing    Time 5    Period Weeks    Status On-going      PT LONG TERM GOAL #4   Title return to walking and/or running program as tolerated for ~ 15-20 min with minimal to no increase in pain    Time 6    Period Weeks    Status On-going      PT LONG TERM GOAL #5   Title Independent in HEP    Time 6    Period Weeks    Status On-going      PT LONG TERM GOAL #6   Title Improve FOTO to </= 34% limitation    Time 6    Period Weeks    Status On-going                 Plan - 06/18/20 0815    Clinical Impression Statement Pt making good gains, reporting less pain in Lt calf/Achilles. Pt tolerated exercises today without increase in pain.  Continued tightness in Lt lateral gastroc; also tight with stretch to lateral Lt hamstring. Decreased balance in LLE compared to RLE with high level balance exercise. Lt ankle DF greater than RLE.  Progressing well towards goals.    Stability/Clinical Decision Making Stable/Uncomplicated    Rehab Potential Good    PT Frequency 2x / week    PT Duration 6 weeks    PT Treatment/Interventions Patient/family education;ADLs/Self Care Home Management;Aquatic Therapy;Cryotherapy;Electrical Stimulation;Iontophoresis 4mg /ml Dexamethasone;Moist Heat;Ultrasound;Gait training;Stair training;Functional mobility training;Therapeutic activities;Therapeutic exercise;Balance training;Neuromuscular re-education;Manual techniques;Passive range of motion;Dry needling;Taping;Vasopneumatic Device    PT Next Visit Plan continue balance and gentle strengthening of lower leg / foot muscles; manual therapy to calf.     PT Home Exercise Plan  [email or web address]    Consulted and Agree with Plan of Care Patient           Patient will benefit from skilled therapeutic intervention in order to improve the following deficits and impairments:  Abnormal gait, Decreased range of motion, Pain, Hypomobility, Impaired flexibility, Improper body mechanics, Decreased mobility, Decreased strength  Visit Diagnosis: Achilles tendinitis of left lower extremity  Other symptoms and signs involving the musculoskeletal system  Other abnormalities of gait and mobility     Problem List Patient Active Problem List   Diagnosis Date Noted  . Left Achilles tendinitis 06/03/2020  . 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 Postpartum care following cesarean delivery (1/31) 01/14/2020  . Cesarean delivery due to maternal disorder 01/13/2020  . Previous cesarean delivery affecting pregnancy 01/13/2020  . Gestational hypertension 10/01/2014  . Obesity 08/14/2013   Mayer Camel, PTA 06/18/20 9:03 AM  [facility] [place] [address] [place], [place], [address] Phone: [phone]   Fax:  [phone]  Name: Donna Tucker MRN: [id number] Date of Birth: 1986-05-14

## 2020-06-18 NOTE — Patient Instructions (Signed)
Access Code: [email or web address]: [email or web address]: 07/07/2021Prepared by: [facility] [place]  Supine Calf Stretch with Strap - 2 x daily - 7 x weekly - 1 sets - 3 reps - 30 sec hold  Long Sitting Calf Stretch with Strap - 2 x daily - 7 x weekly - 1 sets - 3 reps - 30 sec hold  Gastroc Stretch on Wall - 2 x daily - 7 x weekly - 1 sets - 3 reps - 30 sec hold  Soleus Stretch on Wall - 2 x daily - 7 x weekly - 1 sets - 3 reps - 30 sec hold  Supine Ankle Circles - 2 x daily - 7 x weekly - 1-2 sets - 10 reps  Seated Ankle Pumps - 2 x daily - 7 x weekly - 1-2 sets - 10 reps  Forward T - 1 x daily - 3 x weekly - 1-2 sets - 5-10 reps  Single Leg Stance on Foam Pad - 1 x daily - 7 x weekly - 1 sets - 3 reps - 30 hold  Hooklying Hamstring Stretch with Strap - 1 x daily - 7 x weekly - 1 sets - 3 reps - 20 hold

## 2020-06-23 ENCOUNTER — Other Ambulatory Visit: Payer: Self-pay

## 2020-06-23 ENCOUNTER — Ambulatory Visit (INDEPENDENT_AMBULATORY_CARE_PROVIDER_SITE_OTHER): Payer: 59 | Admitting: Physical Therapy

## 2020-06-23 ENCOUNTER — Encounter: Payer: Self-pay | Admitting: Physical Therapy

## 2020-06-23 DIAGNOSIS — R2689 Other abnormalities of gait and mobility: Secondary | ICD-10-CM | POA: Diagnosis not present

## 2020-06-23 DIAGNOSIS — R29898 Other symptoms and signs involving the musculoskeletal system: Secondary | ICD-10-CM | POA: Diagnosis not present

## 2020-06-23 DIAGNOSIS — O135 Gestational [pregnancy-induced] hypertension without significant proteinuria, complicating the puerperium: Secondary | ICD-10-CM | POA: Diagnosis not present

## 2020-06-23 DIAGNOSIS — M7662 Achilles tendinitis, left leg: Secondary | ICD-10-CM

## 2020-06-23 DIAGNOSIS — O906 Postpartum mood disturbance: Secondary | ICD-10-CM | POA: Diagnosis not present

## 2020-06-23 NOTE — Therapy (Signed)
[place], [place], [address] Phone: [phone]   Fax:  [phone]  Physical Therapy Treatment  Patient Details  Name: Donna Tucker MRN: [id number] Date of Birth: 1986/08/09 Referring Provider (PT): Dr Dianah Field    Encounter Date: 06/23/2020   PT End of Session - 06/23/20 0846    Visit Number 4    Number of Visits 12    Date for PT Re-Evaluation 07/21/20    PT Start Time 0806    PT Stop Time 0841    PT Time Calculation (min) 35 min    Activity Tolerance Patient tolerated treatment well    Behavior During Therapy [facility] for tasks assessed/performed           Past Medical History:  Diagnosis Date  . Complication of anesthesia    post partum itching  . Gluten intolerance   . Graves' disease    endocrinologist--- dr Chalmers Cater-- dx 2012  . Hx of pre-eclampsia in prior pregnancy, currently pregnant   . Hx of varicella   . Hypothyroidism   . Pregnancy induced hypertension   . Tachycardia   . Wears glasses   . Wheat allergy     Past Surgical History:  Procedure Laterality Date  . CESAREAN SECTION N/A 10/02/2014   Procedure: CESAREAN SECTION;  Surgeon: Lovenia Kim, MD;  Location: [place] [facility];  Service: Obstetrics;  Laterality: N/A;  . CESAREAN SECTION N/A 01/13/2020   Procedure: Repeat CESAREAN SECTION;  Surgeon: Brien Few, MD;  Location: [place] [facility];  Service: Obstetrics;  Laterality: N/A;  EDD: 02/03/20  . THYROIDECTOMY N/A 11/02/2018   Procedure: TOTAL THYROIDECTOMY;  Surgeon: Armandina Gemma, MD;  Location: [facility];  Service: General;  Laterality: N/A;    There were no vitals filed for this visit.   Subjective Assessment - 06/23/20 0811    Subjective Pt reports she took 3 walks since last visit (2-3 miles).  All were completed without any pain. Her LLE feels tired, but not painful.  No longer has pain in Lt ankle with stairs, however reports she is not confident descending steps  without UE support. Left leg feels wobbly.   Patient Stated Goals get back to running - and get rid of pain so she does not have to limit activities (last ran ~ 6/20 and was running ~ 5 miles - has run half marathons in the past)    Currently in Pain? No/denies    Pain Score 0-No pain              [facility] PT Assessment - 06/23/20 0001      Assessment   Medical Diagnosis Lt Achille's tendinitis     Referring Provider (PT) Dr Dianah Field     Onset Date/Surgical Date 02/10/20    Hand Dominance Right    Next MD Visit 07/01/20    Prior Therapy none       AROM   Left Ankle Inversion --   WNL, pain free   Left Ankle Eversion --   WNL, painfree            [facility] Adult PT Treatment/Exercise - 06/23/20 0001      Knee/Hip Exercises: Stretches   Passive Hamstring Stretch Left;Right;1 rep;30 seconds    ITB Stretch Left;Right;1 rep;30 seconds      Knee/Hip Exercises: Standing   Lateral Step Up Left;1 set;10 reps   onto Bosu and 3-5 sec balance   Forward Step Up Left;1 set;10 reps;Hand Hold: 0  onto Bosu and 3-5 sec balance   Step Down Right;1 set;10 reps;Hand Hold: 0;Step Height: 6"   and retro step up     Ankle Exercises: Standing   SLS  SLS forward leans to touch chair x 5 reps each side, not touching foot in between reps to challenge balance.     Heel Raises Both;10 reps   UE support on rail.    Heel Walk (Round Trip) 16    Toe Walk (Round Trip) 16    Side Shuffle (Round Trip) 80   slow speed side step to shuffle with hop   Braiding (Round Trip) 32    Other Standing Ankle Exercises tandem stance on 1/2 foam roll (flat side up) x 30 sec x 2      Ankle Exercises: Aerobic   Nustep L5: 4 min for warm up (legs only)      Ankle Exercises: Stretches   Soleus Stretch 2 reps;30 seconds   incline board   Gastroc Stretch 30 seconds;4 reps   incline board      Ankle Exercises: Sidelying   Other Sidelying Ankle Exercises R/Lt hip abdct x 10 reps each side.                         PT Long Term Goals - 06/23/20 0837      PT LONG TERM GOAL #1   Title Patient demonstrate full pain free ankle Lt ROM    Time 6    Period Weeks    Status Achieved      PT LONG TERM GOAL #2   Title Normal gait pattern with no imp    Time 6    Period Weeks    Status Achieved      PT LONG TERM GOAL #3   Title 5/5 strength Lt plantar flexion with no pain with resistive testing    Time 5    Period Weeks    Status On-going      PT LONG TERM GOAL #4   Title return to walking and/or running program as tolerated for ~ 15-20 min with minimal to no increase in pain    Time 6    Period Weeks    Status Partially Met      PT LONG TERM GOAL #5   Title Independent in HEP    Time 6    Period Weeks    Status On-going      PT LONG TERM GOAL #6   Title Improve FOTO to </= 34% limitation    Time 6    Period Weeks    Status On-going                 Plan - 06/23/20 0017    Clinical Impression Statement Pt tolerated new exercises well, without increase in pain. She has met LTG#1 and 2.  Pt tolerated all exercises, including bilat heel raises without pain.  Encouraged pt to not over do it, and comply with self care (stretches, self massage, ice). Pt making good progress towards remaining goals.    Stability/Clinical Decision Making Stable/Uncomplicated    Rehab Potential Good    PT Frequency 2x / week    PT Duration 6 weeks    PT Treatment/Interventions Patient/family education;ADLs/Self Care Home Management;Aquatic Therapy;Cryotherapy;Electrical Stimulation;Iontophoresis '4mg'$ /ml Dexamethasone;Moist Heat;Ultrasound;Gait training;Stair training;Functional mobility training;Therapeutic activities;Therapeutic exercise;Balance training;Neuromuscular re-education;Manual techniques;Passive range of motion;Dry needling;Taping;Vasopneumatic Device    PT Next Visit Plan continue balance and gentle strengthening of lower  leg / foot muscles; manual therapy to calf.    PT  Home Exercise Plan [email or web address]    Consulted and Agree with Plan of Care Patient           Patient will benefit from skilled therapeutic intervention in order to improve the following deficits and impairments:  Abnormal gait, Decreased range of motion, Pain, Hypomobility, Impaired flexibility, Improper body mechanics, Decreased mobility, Decreased strength  Visit Diagnosis: Achilles tendinitis of left lower extremity  Other symptoms and signs involving the musculoskeletal system  Other abnormalities of gait and mobility     Problem List Patient Active Problem List   Diagnosis Date Noted  . Left Achilles tendinitis 06/03/2020  . 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 Postpartum care following cesarean delivery (1/31) 01/14/2020  . Cesarean delivery due to maternal disorder 01/13/2020  . Previous cesarean delivery affecting pregnancy 01/13/2020  . Gestational hypertension 10/01/2014  . Obesity 08/14/2013   Kerin Perna, PTA 06/23/20 11:35 AM  [facility] [place], [place], [address] Phone: [phone]   Fax:  [phone]  Name: JALAINA SALYERS MRN: [id number] Date of Birth: 03-28-1986

## 2020-06-25 ENCOUNTER — Ambulatory Visit (INDEPENDENT_AMBULATORY_CARE_PROVIDER_SITE_OTHER): Payer: 59 | Admitting: Physical Therapy

## 2020-06-25 ENCOUNTER — Encounter: Payer: Self-pay | Admitting: Physical Therapy

## 2020-06-25 ENCOUNTER — Other Ambulatory Visit: Payer: Self-pay

## 2020-06-25 DIAGNOSIS — R2689 Other abnormalities of gait and mobility: Secondary | ICD-10-CM | POA: Diagnosis not present

## 2020-06-25 DIAGNOSIS — R29898 Other symptoms and signs involving the musculoskeletal system: Secondary | ICD-10-CM

## 2020-06-25 DIAGNOSIS — M7662 Achilles tendinitis, left leg: Secondary | ICD-10-CM | POA: Diagnosis not present

## 2020-06-25 NOTE — Patient Instructions (Signed)
Access Code: [email or web address]: [email or web address]: 07/14/2021Prepared by: [facility] [place]  Gastroc Stretch on Wall - 2 x daily - 7 x weekly - 1 sets - 3 reps - 30 sec hold  Soleus Stretch on Wall - 2 x daily - 7 x weekly - 1 sets - 3 reps - 30 sec hold  Hooklying Hamstring Stretch with Strap - 1 x daily - 7 x weekly - 1 sets - 3 reps - 20 hold  Supine Ankle Circles - 2 x daily - 7 x weekly - 1-2 sets - 10 reps  Seated Ankle Pumps - 2 x daily - 7 x weekly - 1-2 sets - 10 reps  Forward T - 1 x daily - 3 x weekly - 1-2 sets - 10 reps  X Band Walk - 1 x daily - 3 x weekly - 2 sets - 10 reps  Single Leg Balance with Clock Reach - 1 x daily - 3 x weekly - 2 sets - 5 reps  Standing Heel Raise - 1 x daily - 3 x weekly - 3 sets - 10 reps

## 2020-06-25 NOTE — Therapy (Signed)
[place], [place], [address] Phone: [phone]   Fax:  [phone]  Physical Therapy Treatment  Patient Details  Name: Donna Tucker MRN: [id number] Date of Birth: 1986-07-30 Referring Provider (PT): Dr Dianah Field    Encounter Date: 06/25/2020   PT End of Session - 06/25/20 0808    Visit Number 5    Number of Visits 12    Date for PT Re-Evaluation 07/21/20    PT Start Time 0803    PT Stop Time 0847    PT Time Calculation (min) 44 min    Activity Tolerance Patient tolerated treatment well    Behavior During Therapy [facility] for tasks assessed/performed           Past Medical History:  Diagnosis Date  . Complication of anesthesia    post partum itching  . Gluten intolerance   . Graves' disease    endocrinologist--- dr Chalmers Cater-- dx 2012  . Hx of pre-eclampsia in prior pregnancy, currently pregnant   . Hx of varicella   . Hypothyroidism   . Pregnancy induced hypertension   . Tachycardia   . Wears glasses   . Wheat allergy     Past Surgical History:  Procedure Laterality Date  . CESAREAN SECTION N/A 10/02/2014   Procedure: CESAREAN SECTION;  Surgeon: Lovenia Kim, MD;  Location: [facility];  Service: Obstetrics;  Laterality: N/A;  . CESAREAN SECTION N/A 01/13/2020   Procedure: Repeat CESAREAN SECTION;  Surgeon: Brien Few, MD;  Location: [facility];  Service: Obstetrics;  Laterality: N/A;  EDD: 02/03/20  . THYROIDECTOMY N/A 11/02/2018   Procedure: TOTAL THYROIDECTOMY;  Surgeon: Armandina Gemma, MD;  Location: [facility];  Service: General;  Laterality: N/A;    There were no vitals filed for this visit.   Subjective Assessment - 06/25/20 0808    Subjective Pt reports she walked 5k on Monday afternoon on an uneven road. No pain, just fatigue mostly noted in Lt hip ("uncomfortable").    Patient Stated Goals get back to running - and get rid of pain so she does not have to limit activities  (last ran ~ 6/20 and was running ~ 5 miles - has run half marathons in the past)    Currently in Pain? No/denies    Pain Score 0-No pain              [facility] PT Assessment - 06/25/20 0001      Assessment   Medical Diagnosis Lt Achille's tendinitis     Referring Provider (PT) Dr Dianah Field     Onset Date/Surgical Date 02/10/20    Hand Dominance Right    Next MD Visit 07/01/20    Prior Therapy none             [facility] Adult PT Treatment/Exercise - 06/25/20 0001      Knee/Hip Exercises: Stretches   Passive Hamstring Stretch Left;Right;1 rep;30 seconds    ITB Stretch Left;Right;1 rep;30 seconds    Soleus Stretch Left;Right;1 rep;20 seconds      Knee/Hip Exercises: Standing   SLS SLS on blue pad with mini knee bend and toe tap front, side, back x 5 reps each leg, repeated x 2 reps without pad.     Other Standing Knee Exercises X band (green) side stepping x 10 steps Rt/Lt      Knee/Hip Exercises: Supine   Single Leg Bridge Strengthening;Left;Right;1 set;10 reps   fig 4 leg     Manual Therapy  Manual Therapy Soft tissue mobilization    Soft tissue mobilization IASTM and STM to Lt post calf and medial/lateral area to  Achilles       Ankle Exercises: Aerobic   Elliptical L2: 3 min for warm up      Ankle Exercises: Stretches   Soleus Stretch 1 rep;20 seconds    Gastroc Stretch 30 seconds;3 reps      Ankle Exercises: Standing   SLS SLS on bosu x 20 sec each leg x 2 reps    Heel Raises Both;10 reps   2 sets   Heel Walk (Round Trip) 16   forward 8'/backward 8'   Toe Walk (Round Trip) 16   forward 8'/backward 8'   Other Standing Ankle Exercises Lt SLS forward leans to touch chair with Rt hand x 5 reps, 2 sets.                   PT Education - 06/25/20 1034    Education Details HEP    Person(s) Educated Patient    Methods Explanation;Handout;Verbal cues;Demonstration    Comprehension Verbalized understanding;Returned demonstration               PT Long  Term Goals - 06/23/20 0837      PT LONG TERM GOAL #1   Title Patient demonstrate full pain free ankle Lt ROM    Time 6    Period Weeks    Status Achieved      PT LONG TERM GOAL #2   Title Normal gait pattern with no imp    Time 6    Period Weeks    Status Achieved      PT LONG TERM GOAL #3   Title 5/5 strength Lt plantar flexion with no pain with resistive testing    Time 5    Period Weeks    Status On-going      PT LONG TERM GOAL #4   Title return to walking and/or running program as tolerated for ~ 15-20 min with minimal to no increase in pain    Time 6    Period Weeks    Status Partially Met      PT LONG TERM GOAL #5   Title Independent in HEP    Time 6    Period Weeks    Status On-going      PT LONG TERM GOAL #6   Title Improve FOTO to </= 34% limitation    Time 6    Period Weeks    Status On-going                 Plan - 06/25/20 1517    Clinical Impression Statement Session focused on strengthening muscles in LE chain. Pt tolerated all exercises well, without any increase in pain.  LLE balance is improving. Making good progress towards goals.    Stability/Clinical Decision Making Stable/Uncomplicated    Rehab Potential Good    PT Frequency 2x / week    PT Duration 6 weeks    PT Treatment/Interventions Patient/family education;ADLs/Self Care Home Management;Aquatic Therapy;Cryotherapy;Electrical Stimulation;Iontophoresis 78m/ml Dexamethasone;Moist Heat;Ultrasound;Gait training;Stair training;Functional mobility training;Therapeutic activities;Therapeutic exercise;Balance training;Neuromuscular re-education;Manual techniques;Passive range of motion;Dry needling;Taping;Vasopneumatic Device    PT Next Visit Plan MMT, FOTO, MD note    PT Home Exercise Plan [email or web address]   Consulted and Agree with Plan of Care Patient           Patient will benefit from skilled therapeutic intervention in order to improve the following deficits  and impairments:  Abnormal gait,  Decreased range of motion, Pain, Hypomobility, Impaired flexibility, Improper body mechanics, Decreased mobility, Decreased strength  Visit Diagnosis: Achilles tendinitis of left lower extremity  Other symptoms and signs involving the musculoskeletal system  Other abnormalities of gait and mobility     Problem List Patient Active Problem List   Diagnosis Date Noted  . Left Achilles tendinitis 06/03/2020  . 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 Postpartum care following cesarean delivery (1/31) 01/14/2020  . Cesarean delivery due to maternal disorder 01/13/2020  . Previous cesarean delivery affecting pregnancy 01/13/2020  . Gestational hypertension 10/01/2014  . Obesity 08/14/2013   Kerin Perna, PTA 06/25/20 10:38 AM  [facility] [place], [place], [address] Phone: [phone]   Fax:  [phone]  Name: Donna Tucker MRN: [id number] Date of Birth: 06-20-1986

## 2020-06-30 ENCOUNTER — Other Ambulatory Visit: Payer: Self-pay

## 2020-06-30 ENCOUNTER — Ambulatory Visit (INDEPENDENT_AMBULATORY_CARE_PROVIDER_SITE_OTHER): Payer: 59 | Admitting: Physical Therapy

## 2020-06-30 DIAGNOSIS — M7662 Achilles tendinitis, left leg: Secondary | ICD-10-CM

## 2020-06-30 DIAGNOSIS — R2689 Other abnormalities of gait and mobility: Secondary | ICD-10-CM | POA: Diagnosis not present

## 2020-06-30 DIAGNOSIS — R29898 Other symptoms and signs involving the musculoskeletal system: Secondary | ICD-10-CM

## 2020-06-30 NOTE — Therapy (Signed)
[place], [place], [address] Phone: [phone]   Fax:  [phone]  Physical Therapy Treatment  Patient Details  Name: Donna Tucker MRN: [id number] Date of Birth: 1986/05/24 Referring Provider (PT): Dr Dianah Field    Encounter Date: 06/30/2020   PT End of Session - 06/30/20 0816    Visit Number 6    Number of Visits 12    Date for PT Re-Evaluation 07/21/20    PT Start Time 0813   pt arrived late; traffic   PT Stop Time 0844    PT Time Calculation (min) 31 min    Activity Tolerance Patient tolerated treatment well;No increased pain    Behavior During Therapy WFL for tasks assessed/performed           Past Medical History:  Diagnosis Date  . Complication of anesthesia    post partum itching  . Gluten intolerance   . Graves' disease    endocrinologist--- dr Chalmers Cater-- dx 2012  . Hx of pre-eclampsia in prior pregnancy, currently pregnant   . Hx of varicella   . Hypothyroidism   . Pregnancy induced hypertension   . Tachycardia   . Wears glasses   . Wheat allergy     Past Surgical History:  Procedure Laterality Date  . CESAREAN SECTION N/A 10/02/2014   Procedure: CESAREAN SECTION;  Surgeon: Lovenia Kim, MD;  Location: [facility];  Service: Obstetrics;  Laterality: N/A;  . CESAREAN SECTION N/A 01/13/2020   Procedure: Repeat CESAREAN SECTION;  Surgeon: Brien Few, MD;  Location: [facility];  Service: Obstetrics;  Laterality: N/A;  EDD: 02/03/20  . THYROIDECTOMY N/A 11/02/2018   Procedure: TOTAL THYROIDECTOMY;  Surgeon: Armandina Gemma, MD;  Location: [facility];  Service: General;  Laterality: N/A;    There were no vitals filed for this visit.   Subjective Assessment - 06/30/20 0813    Subjective "I was surprisingly sore after last treatment, but not in the ankle".  She reports she was able to run up the stairs at home without any problem.    Patient Stated Goals get back to running -  and get rid of pain so she does not have to limit activities (last ran ~ 6/20 and was running ~ 5 miles - has run half marathons in the past)    Currently in Pain? No/denies    Pain Score 0-No pain              [facility] PT Assessment - 06/30/20 0001      Assessment   Medical Diagnosis Lt Achille's tendinitis     Referring Provider (PT) Dr Dianah Field     Onset Date/Surgical Date 02/10/20    Hand Dominance Right    Next MD Visit 07/01/20    Prior Therapy none       Observation/Other Assessments   Focus on Therapeutic Outcomes (FOTO)  20% limited.       Strength   Right Hip Flexion 5/5    Right Hip Extension 5/5    Right Hip ABduction 5/5    Left Hip Flexion 5/5    Left Hip Extension 5/5    Left Hip ABduction 5/5    Left Ankle Plantar Flexion 5/5   20 single heel raises           [facility] Adult PT Treatment/Exercise - 06/30/20 0001      Knee/Hip Exercises: Stretches   Passive Hamstring Stretch Left;Right;1 rep;30 seconds    ITB Stretch  Left;Right;1 rep;30 seconds    Piriformis Stretch Right;Left;1 rep;20 seconds    Gastroc Stretch Both;1 rep;30 seconds    Soleus Stretch Both;1 rep;30 seconds    Other Knee/Hip Stretches Lt/Rt adductor stretch 20 sec each      Knee/Hip Exercises: Standing   Stairs descending 13 steps (no rail), then running up/down 6 steps with no rail. then ascending 2 steps at a time x 6.     SLS SLS on mini-tramp x 30 sec with horiz head turns     Other Standing Knee Exercises jogging on mini tramp, small hops on mini tramp.       Ankle Exercises: Standing   SLS Lt/Rt SLS on blue pad with mini knee bend and toe taps front, side, back x 5 reps    Heel Raises Right;Left;20 reps    Side Shuffle (Round Trip) 80 ft   slow speed side step to shuffle with hop   Other Standing Ankle Exercises Lt SLS forward leans to touch 6" step x 5 reps (not touching foot in between reps);  split squats with limited depth x 10 reps each leg.       Ankle Exercises: Aerobic    Elliptical L2: 2.5 min for warm up    Other Aerobic walk/jog progression 200 ft, 10 sec of each.              PT Long Term Goals - 06/23/20 0837      PT LONG TERM GOAL #1   Title Patient demonstrate full pain free ankle Lt ROM    Time 6    Period Weeks    Status Achieved      PT LONG TERM GOAL #2   Title Normal gait pattern with no imp    Time 6    Period Weeks    Status Achieved      PT LONG TERM GOAL #3   Title 5/5 strength Lt plantar flexion with no pain with resistive testing    Time 5    Period Weeks    Status On-going      PT LONG TERM GOAL #4   Title return to walking and/or running program as tolerated for ~ 15-20 min with minimal to no increase in pain    Time 6    Period Weeks    Status Partially Met      PT LONG TERM GOAL #5   Title Independent in HEP    Time 6    Period Weeks    Status On-going      PT LONG TERM GOAL #6   Title Improve FOTO to </= 34% limitation    Time 6    Period Weeks    Status On-going                 Plan - 06/30/20 2060    Clinical Impression Statement Pt demonstrated improved leg and ankle strength.  Pt able to tolerate small bouts of jogging and stairs (at quick pace) without difficulty or pain, just fatigue in Lt calf.  Pt has partially met her goals.  Plan to assist pt in walk/run program over next few weeks - pt agreeable to decreased freq over next 3 wks.    Stability/Clinical Decision Making Stable/Uncomplicated    Rehab Potential Good    PT Frequency 2x / week    PT Duration 6 weeks    PT Treatment/Interventions Patient/family education;ADLs/Self Care Home Management;Aquatic Therapy;Cryotherapy;Electrical Stimulation;Iontophoresis 60m/ml Dexamethasone;Moist Heat;Ultrasound;Gait training;Stair training;Functional mobility training;Therapeutic  activities;Therapeutic exercise;Balance training;Neuromuscular re-education;Manual techniques;Passive range of motion;Dry needling;Taping;Vasopneumatic Device    PT Next Visit  Plan cont LE strengthening and agility.    PT Home Exercise Plan [email or web address]    Consulted and Agree with Plan of Care Patient           Patient will benefit from skilled therapeutic intervention in order to improve the following deficits and impairments:  Abnormal gait, Decreased range of motion, Pain, Hypomobility, Impaired flexibility, Improper body mechanics, Decreased mobility, Decreased strength  Visit Diagnosis: Achilles tendinitis of left lower extremity  Other symptoms and signs involving the musculoskeletal system  Other abnormalities of gait and mobility     Problem List Patient Active Problem List   Diagnosis Date Noted  . Left Achilles tendinitis 06/03/2020  . 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 Postpartum care following cesarean delivery (1/31) 01/14/2020  . Cesarean delivery due to maternal disorder 01/13/2020  . Previous cesarean delivery affecting pregnancy 01/13/2020  . Gestational hypertension 10/01/2014  . Obesity 08/14/2013   Kerin Perna, PTA 06/30/20 1:19 PM  [facility] [place], [place], [address] Phone: [phone]   Fax:  [phone]  Name: Donna Tucker MRN: [id number] Date of Birth: 12/25/1985

## 2020-07-01 ENCOUNTER — Encounter: Payer: Self-pay | Admitting: Sports Medicine

## 2020-07-01 ENCOUNTER — Ambulatory Visit (INDEPENDENT_AMBULATORY_CARE_PROVIDER_SITE_OTHER): Payer: 59 | Admitting: Sports Medicine

## 2020-07-01 DIAGNOSIS — M7662 Achilles tendinitis, left leg: Secondary | ICD-10-CM

## 2020-07-01 NOTE — Assessment & Plan Note (Signed)
This pleasant 34 year old female returns, we have been treating her for left worse than right Achilles tendinosis, she had a mid Achilles nodule, she has done extremely well with physical therapy, heel lifts, topical nitroglycerin, she can return to see me on an as-needed basis and continue PT until they graduate her to a home exercise program.

## 2020-07-01 NOTE — Progress Notes (Signed)
    Procedures performed today:    None.  Independent interpretation of notes and tests performed by another provider:   None.  Brief History, Exam, Impression, and Recommendations:    Left Achilles tendinitis This pleasant 34 year old female returns, we have been treating her for left worse than right Achilles tendinosis, she had a mid Achilles nodule, she has done extremely well with physical therapy, heel lifts, topical nitroglycerin, she can return to see me on an as-needed basis and continue PT until they graduate her to a home exercise program.    ___________________________________________ Ihor Austin. Benjamin Stain, M.D., ABFM., CAQSM. Primary Care and Sports Medicine [facility]  Adjunct Instructor of Family Medicine  [employer]

## 2020-07-03 ENCOUNTER — Encounter: Payer: 59 | Admitting: Physical Therapy

## 2020-07-03 DIAGNOSIS — E890 Postprocedural hypothyroidism: Secondary | ICD-10-CM | POA: Diagnosis not present

## 2020-07-03 DIAGNOSIS — E559 Vitamin D deficiency, unspecified: Secondary | ICD-10-CM | POA: Diagnosis not present

## 2020-07-04 ENCOUNTER — Other Ambulatory Visit (HOSPITAL_COMMUNITY): Payer: Self-pay | Admitting: Obstetrics and Gynecology

## 2020-07-04 MED FILL — NITROGLYCERIN 0.2 MG/HR PTC: 0.2 | 30 days supply | Qty: 8 | Fill #1

## 2020-07-04 MED FILL — SERTRALINE HCL 25 MG TABLET: 25 | 90 days supply | Qty: 90 | Fill #0

## 2020-07-07 ENCOUNTER — Encounter: Payer: Self-pay | Admitting: Physical Therapy

## 2020-07-07 ENCOUNTER — Other Ambulatory Visit: Payer: Self-pay

## 2020-07-07 ENCOUNTER — Ambulatory Visit (INDEPENDENT_AMBULATORY_CARE_PROVIDER_SITE_OTHER): Payer: 59 | Admitting: Physical Therapy

## 2020-07-07 DIAGNOSIS — R29898 Other symptoms and signs involving the musculoskeletal system: Secondary | ICD-10-CM

## 2020-07-07 DIAGNOSIS — M7662 Achilles tendinitis, left leg: Secondary | ICD-10-CM

## 2020-07-07 DIAGNOSIS — R2689 Other abnormalities of gait and mobility: Secondary | ICD-10-CM | POA: Diagnosis not present

## 2020-07-07 NOTE — Therapy (Signed)
[place], [place], [address] Phone: [phone]   Fax:  [phone]  Physical Therapy Treatment  Patient Details  Name: Donna Tucker MRN: [id number] Date of Birth: September 09, 1986 Referring Provider (PT): Dr Dianah Field    Encounter Date: 07/07/2020   PT End of Session - 07/07/20 0715    Visit Number 7    Number of Visits 12    Date for PT Re-Evaluation 07/21/20    PT Start Time 0715    PT Stop Time 0802    PT Time Calculation (min) 47 min    Activity Tolerance Patient tolerated treatment well;No increased pain    Behavior During Therapy [facility] for tasks assessed/performed           Past Medical History:  Diagnosis Date   Complication of anesthesia    post partum itching   Gluten intolerance    Graves' disease    endocrinologist--- dr Chalmers Cater-- dx 2012   Hx of pre-eclampsia in prior pregnancy, currently pregnant    Hx of varicella    Hypothyroidism    Pregnancy induced hypertension    Tachycardia    Wears glasses    Wheat allergy     Past Surgical History:  Procedure Laterality Date   CESAREAN SECTION N/A 10/02/2014   Procedure: CESAREAN SECTION;  Surgeon: Lovenia Kim, MD;  Location: [facility];  Service: Obstetrics;  Laterality: N/A;   CESAREAN SECTION N/A 01/13/2020   Procedure: Repeat CESAREAN SECTION;  Surgeon: Brien Few, MD;  Location: [facility];  Service: Obstetrics;  Laterality: N/A;  EDD: 02/03/20   THYROIDECTOMY N/A 11/02/2018   Procedure: TOTAL THYROIDECTOMY;  Surgeon: Armandina Gemma, MD;  Location: [facility];  Service: General;  Laterality: N/A;    There were no vitals filed for this visit.   Subjective Assessment - 07/07/20 0716    Subjective Pt reports she completed first day of couch to 5K yesterday. Went well. She also did a lot of squatting with cleaning house; her ankle/foot "feels cranky".    Patient Stated Goals get back to running - and get rid of  pain so she does not have to limit activities (last ran ~ 6/20 and was running ~ 5 miles - has run half marathons in the past)    Currently in Pain? No/denies    Pain Score 0-No pain              [facility] PT Assessment - 07/07/20 0001      Assessment   Medical Diagnosis Lt Achille's tendinitis     Referring Provider (PT) Dr Dianah Field     Onset Date/Surgical Date 02/10/20    Hand Dominance Right    Next MD Visit 07/01/20    Prior Therapy none             [facility] Adult PT Treatment/Exercise - 07/07/20 0001      Self-Care   Other Self-Care Comments  Pt educated on self application of Rock tape to Lt arch of foot.  Pt verbalized understanding.       Knee/Hip Exercises: Stretches   Passive Hamstring Stretch Left;Right;1 rep;30 seconds    Piriformis Stretch Right;Left;2 reps;20 seconds    Gastroc Stretch Right;Left;2 reps;30 seconds    Soleus Stretch Right;Left;1 rep;30 seconds      Knee/Hip Exercises: Standing   Lateral Step Up Limitations Heel taps x 10 each leg.     Forward Step Up Limitations 10 reps each leg stepping up onto  12" step with retro step back with bilat rail.       Knee/Hip Exercises: Sidelying   Hip ABduction Strengthening;Left;Right;10 reps;4 sets    Hip ABduction Limitations (10 reps reg, 10 reps of circles, 10 reps of rainbow arcs, 10 reps of knee to table to/from hip ext )      Manual Therapy   Manual Therapy Taping    Soft tissue mobilization I strip of reg Rock tape applied to Lt plantar surface of foot, 2 strips perpendiculalr near calcaneus, for support to arch.       Ankle Exercises: Aerobic   Tread Mill 2.5 mph x 3 min, brisk walk    Other Aerobic jog x45 sec, x 2 reps        Ankle Exercises: Standing   SLS Lt/Rt SLS on blue pad with eyes shut x 10 sec x 3 reps each side, intermittent UE to steady.     Heel Raises Right;Left;5 reps   eccentric lowering, per HEP.   Other Standing Ankle Exercises Lt/Rt SLS forward leans to 6" step x 10 reps; trial  of single leg squat to chair touch (HEP) x 1 rep each side.                        PT Long Term Goals - 06/23/20 0837      PT LONG TERM GOAL #1   Title Patient demonstrate full pain free ankle Lt ROM    Time 6    Period Weeks    Status Achieved      PT LONG TERM GOAL #2   Title Normal gait pattern with no imp    Time 6    Period Weeks    Status Achieved      PT LONG TERM GOAL #3   Title 5/5 strength Lt plantar flexion with no pain with resistive testing    Time 5    Period Weeks    Status On-going      PT LONG TERM GOAL #4   Title return to walking and/or running program as tolerated for ~ 15-20 min with minimal to no increase in pain    Time 6    Period Weeks    Status Partially Met      PT LONG TERM GOAL #5   Title Independent in HEP    Time 6    Period Weeks    Status On-going      PT LONG TERM GOAL #6   Title Improve FOTO to </= 34% limitation    Time 6    Period Weeks    Status On-going                 Plan - 07/07/20 1331    Clinical Impression Statement Pt reported decrease of Lt ankle stiffness/ discomfort with exercises.  Progressing well with LE strengthening and balance. Shown additional exercises for HEP and how to tape foot for added support.  Progressing towards remaining goals.    Stability/Clinical Decision Making Stable/Uncomplicated    Rehab Potential Good    PT Frequency 2x / week    PT Duration 6 weeks    PT Treatment/Interventions Patient/family education;ADLs/Self Care Home Management;Aquatic Therapy;Cryotherapy;Electrical Stimulation;Iontophoresis 48m/ml Dexamethasone;Moist Heat;Ultrasound;Gait training;Stair training;Functional mobility training;Therapeutic activities;Therapeutic exercise;Balance training;Neuromuscular re-education;Manual techniques;Passive range of motion;Dry needling;Taping;Vasopneumatic Device    PT Next Visit Plan cont LE strengthening and agility.    PT Home Exercise Plan [email or web address]   Consulted and  Agree  with Plan of Care Patient           Patient will benefit from skilled therapeutic intervention in order to improve the following deficits and impairments:  Abnormal gait, Decreased range of motion, Pain, Hypomobility, Impaired flexibility, Improper body mechanics, Decreased mobility, Decreased strength  Visit Diagnosis: Achilles tendinitis of left lower extremity  Other symptoms and signs involving the musculoskeletal system  Other abnormalities of gait and mobility     Problem List Patient Active Problem List   Diagnosis Date Noted   Left Achilles tendinitis 06/03/2020   Post-surgical hypothyroidism 01/15/2020   Postpartum care following cesarean delivery (1/31) 01/14/2020   Cesarean delivery due to maternal disorder 01/13/2020   Previous cesarean delivery affecting pregnancy 01/13/2020   Gestational hypertension 10/01/2014   Obesity 08/14/2013   Kerin Perna, PTA 07/07/20 1:32 PM  [place], [place], [address] Phone: [phone]   Fax:  [phone]  Name: LOANNE EMERY MRN: [id number] Date of Birth: March 01, 1986

## 2020-07-07 NOTE — Patient Instructions (Signed)
Access Code: [email or web address]: [email or web address]: 07/26/2021Prepared by: [facility]ercises  Gastroc Stretch on Wall - 2 x daily - 7 x weekly - 1 sets - 3 reps - 30 sec hold  Soleus Stretch on Wall - 2 x daily - 7 x weekly - 1 sets - 3 reps - 30 sec hold  Hooklying Hamstring Stretch with Strap - 1 x daily - 7 x weekly - 1 sets - 3 reps - 20 hold  Supine Ankle Circles - 2 x daily - 7 x weekly - 1-2 sets - 10 reps  Seated Ankle Pumps - 2 x daily - 7 x weekly - 1-2 sets - 10 reps  Forward T - 1 x daily - 3 x weekly - 1-2 sets - 10 reps  Single Leg Balance with Clock Reach - 1 x daily - 3 x weekly - 2 sets - 5 reps  Standing Eccentric Heel Raise - 1 x daily - 7 x weekly - 2 sets - 10 reps  Single Leg Squat with Chair Touch - 1 x daily - 7 x weekly - 1 sets - 10 reps  Sidelying Hip Abduction - 1 x daily - 7 x weekly - 3 sets - 10 reps  Supine Piriformis Stretch with Foot on Ground - 1 x daily - 7 x weekly - 1 sets - 2 reps - 20 sec hold

## 2020-07-08 DIAGNOSIS — E559 Vitamin D deficiency, unspecified: Secondary | ICD-10-CM | POA: Diagnosis not present

## 2020-07-08 DIAGNOSIS — I10 Essential (primary) hypertension: Secondary | ICD-10-CM | POA: Diagnosis not present

## 2020-07-08 DIAGNOSIS — E669 Obesity, unspecified: Secondary | ICD-10-CM | POA: Diagnosis not present

## 2020-07-08 DIAGNOSIS — E0500 Thyrotoxicosis with diffuse goiter without thyrotoxic crisis or storm: Secondary | ICD-10-CM | POA: Diagnosis not present

## 2020-07-08 DIAGNOSIS — E890 Postprocedural hypothyroidism: Secondary | ICD-10-CM | POA: Diagnosis not present

## 2020-07-10 ENCOUNTER — Encounter: Payer: Self-pay | Admitting: Emergency Medicine

## 2020-07-10 ENCOUNTER — Emergency Department (INDEPENDENT_AMBULATORY_CARE_PROVIDER_SITE_OTHER)
Admission: EM | Admit: 2020-07-10 | Discharge: 2020-07-10 | Disposition: A | Discharge status: Home / Self Care | Payer: 59 | Source: Home / Self Care | Attending: Family Medicine | Admitting: Family Medicine

## 2020-07-10 ENCOUNTER — Emergency Department (INDEPENDENT_AMBULATORY_CARE_PROVIDER_SITE_OTHER): Payer: 59

## 2020-07-10 ENCOUNTER — Other Ambulatory Visit: Payer: Self-pay

## 2020-07-10 DIAGNOSIS — S99921A Unspecified injury of right foot, initial encounter: Secondary | ICD-10-CM | POA: Diagnosis not present

## 2020-07-10 DIAGNOSIS — M722 Plantar fascial fibromatosis: Secondary | ICD-10-CM | POA: Diagnosis not present

## 2020-07-10 DIAGNOSIS — M79671 Pain in right foot: Secondary | ICD-10-CM | POA: Diagnosis not present

## 2020-07-10 NOTE — Discharge Instructions (Addendum)
Apply ice pack for 20 to 30 minutes, 3 to 4 times daily  Continue until pain and swelling decrease.  Recommend obtaining insoles designed for plantar fasciitis.  Begin range of motion and stretching exercises.  Consider obtaining a "night splint."

## 2020-07-10 NOTE — ED Provider Notes (Signed)
[facility]    CSN: [id number] Arrival date & time: 07/10/20  0825      History   Chief Complaint Chief Complaint  Patient presents with  . Foot Pain    right    HPI Donna Tucker is a 34 y.o. female.   At about 9am yesterday while walking, patient experienced sudden pain in the arch of her right foot.  She denies injury.  Her pain has not improved with Tylenol, ice, and elevation. She has been undergoing therapy for achilles tendonitis of her left ankle, and recently resumed running (3 times during the past 9 days).     The history is provided by the patient.  Foot Pain This is a new problem. The current episode started yesterday. The problem occurs constantly. The problem has not changed since onset.The symptoms are aggravated by walking. Nothing relieves the symptoms. She has tried acetaminophen for the symptoms. The treatment provided no relief.    Past Medical History:  Diagnosis Date  . Complication of anesthesia    post partum itching  . Gluten intolerance   . Graves' disease    endocrinologist--- dr Talmage Nap-- dx 2012  . Hx of pre-eclampsia in prior pregnancy, currently pregnant   . Hx of varicella   . Hypothyroidism   . Pregnancy induced hypertension   . Tachycardia   . Wears glasses   . Wheat allergy     Patient Active Problem List   Diagnosis Date Noted  . Left Achilles tendinitis 06/03/2020  . 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 Postpartum care following cesarean delivery (1/31) 01/14/2020  . Cesarean delivery due to maternal disorder 01/13/2020  . Previous cesarean delivery affecting pregnancy 01/13/2020  . Gestational hypertension 10/01/2014  . Obesity 08/14/2013    Past Surgical History:  Procedure Laterality Date  . CESAREAN SECTION N/A 10/02/2014   Procedure: CESAREAN SECTION;  Surgeon: Lenoard Aden, MD;  Location: [facility];  Service: Obstetrics;  Laterality: N/A;  . CESAREAN SECTION N/A 01/13/2020   Procedure: Repeat  CESAREAN SECTION;  Surgeon: Olivia Mackie, MD;  Location: [facility];  Service: Obstetrics;  Laterality: N/A;  EDD: 02/03/20  . THYROIDECTOMY N/A 11/02/2018   Procedure: TOTAL THYROIDECTOMY;  Surgeon: Darnell Level, MD;  Location: [facility];  Service: General;  Laterality: N/A;    OB History    Gravida  4   Para  2   Term  2   Preterm      AB  2   Living  2     SAB  1   TAB      Ectopic  1   Multiple  0   Live Births  2            Home Medications    Prior to Admission medications   Medication Sig Start Date End Date Taking? Authorizing Provider  levothyroxine (SYNTHROID) 125 MCG tablet Take 125 mcg by mouth every morning. 05/30/20  Yes [provider]  nitroGLYCERIN (NITRODUR - DOSED IN MG/24 HR) 0.2 mg/hr patch Cut and apply 1/4 patch to most painful area q24h. 06/03/20  Yes Monica Becton, MD  sertraline (ZOLOFT) 25 MG tablet Take 25 mg by mouth daily. 05/30/20  Yes [provider]  Prenatal Vit-Fe Fumarate-FA (PRENATAL MULTIVITAMIN) TABS tablet Take 1 tablet by mouth at bedtime.    [provider]    Family History Family History  Problem Relation Age of Onset  . Diabetes Mother  T1DM  . Stroke Mother   . Hypothyroidism Mother   . Hypertension Father   . Graves' disease Maternal Aunt   . Cancer Maternal Aunt         breast  . Cancer Maternal Grandmother   . Graves' disease Cousin   . Cancer Cousin   . Depression Brother   . Graves' disease Paternal Grandfather     Social History Social History   Tobacco Use  . Smoking status: Never Smoker  . Smokeless tobacco: Never Used  Vaping Use  . Vaping Use: Never used  Substance Use Topics  . Alcohol use: Not Currently    Comment: Not since pregnancy  . Drug use: Never     Allergies   Nsaids, Salicylates, Gluten meal, Lactose intolerance (gi), Oxycodone, and Wheat bran   Review of Systems Review of Systems  Musculoskeletal: Negative for joint swelling.   Skin: Negative for color change and wound.  All other systems reviewed and are negative.    Physical Exam Triage Vital Signs ED Triage Vitals  Enc Vitals Group     BP 07/10/20 0857 (!) 130/93     Pulse Rate 07/10/20 0857 73     Resp 07/10/20 0857 16     Temp 07/10/20 0857 99 F (37.2 C)     Temp Source 07/10/20 0857 Oral     SpO2 07/10/20 0857 100 %     Weight --      Height --      Head Circumference --      Peak Flow --      Pain Score 07/10/20 0901 3     Pain Loc --      Pain Edu? --      Excl. in GC? --    No data found.  Updated Vital Signs BP (!) 130/93 (BP Location: Right Arm)   Pulse 73   Temp 99 F (37.2 C) (Oral)   Resp 16   LMP 07/09/2020 (Exact Date)   SpO2 100%   Breastfeeding No   Visual Acuity Right Eye Distance:   Left Eye Distance:   Bilateral Distance:    Right Eye Near:   Left Eye Near:    Bilateral Near:     Physical Exam Vitals and nursing note reviewed.  Constitutional:      General: She is not in acute distress. HENT:     Head: Normocephalic.     Nose: Nose normal.  Eyes:     Pupils: Pupils are equal, round, and reactive to light.  Cardiovascular:     Rate and Rhythm: Normal rate.  Pulmonary:     Effort: Pulmonary effort is normal.  Musculoskeletal:     Cervical back: Normal range of motion.     Left foot: Normal range of motion. Tenderness present. No swelling or deformity.       Feet:     Comments: There is distinct tenderness to palpation over the right plantar fascia near the heel.  Skin:    General: Skin is warm and dry.  Neurological:     Mental Status: She is alert.      UC Treatments / Results  Labs (all labs ordered are listed, but only abnormal results are displayed) Labs Reviewed - No data to display  EKG   Radiology DG Foot Complete Right  Result Date: 07/10/2020 CLINICAL DATA:  Injury, arch pain EXAM: RIGHT FOOT COMPLETE - 3+ VIEW COMPARISON:  None. FINDINGS: There is no evidence of fracture or  dislocation. There is no evidence of arthropathy or other focal bone abnormality. Soft tissues are unremarkable. IMPRESSION: No acute osseous finding Electronically Signed   By: Judie Petit.  Shick M.D.   On: 07/10/2020 09:56    Procedures Procedures (including critical care time)  Medications Ordered in UC Medications - No data to display  Initial Impression / Assessment and Plan / UC Course  I have reviewed the triage vital signs and the nursing notes.  Pertinent labs & imaging results that were available during my care of the patient were reviewed by me and considered in my medical decision making (see chart for details).    Followup with Dr. Rodney [facility] ([facility]) for management.   Final Clinical Impressions(s) / UC Diagnoses   Final diagnoses:  Plantar fasciitis of right foot     Discharge Instructions     Apply ice pack for 20 to 30 minutes, 3 to 4 times daily  Continue until pain and swelling decrease.  Recommend obtaining insoles designed for plantar fasciitis.  Begin range of motion and stretching exercises.  Consider obtaining a "night splint."                                                                                      ED Prescriptions    None        Lattie Haw, MD 07/14/20 1324

## 2020-07-10 NOTE — ED Triage Notes (Signed)
Pain to arch of R foot on the inside - started while walking yesterday - denies injury Tylenol for pain at 4pm yesterday - no relief - did not repeat No bruising or swelling noted Ice & elevation - minimal vaccine  Pt has had a COVID vaccinated

## 2020-07-14 ENCOUNTER — Ambulatory Visit (INDEPENDENT_AMBULATORY_CARE_PROVIDER_SITE_OTHER): Payer: 59 | Admitting: Physical Therapy

## 2020-07-14 ENCOUNTER — Telehealth: Payer: Self-pay | Admitting: Sports Medicine

## 2020-07-14 ENCOUNTER — Other Ambulatory Visit: Payer: Self-pay

## 2020-07-14 DIAGNOSIS — S99921A Unspecified injury of right foot, initial encounter: Secondary | ICD-10-CM | POA: Insufficient documentation

## 2020-07-14 DIAGNOSIS — R2689 Other abnormalities of gait and mobility: Secondary | ICD-10-CM

## 2020-07-14 DIAGNOSIS — M7662 Achilles tendinitis, left leg: Secondary | ICD-10-CM

## 2020-07-14 DIAGNOSIS — R29898 Other symptoms and signs involving the musculoskeletal system: Secondary | ICD-10-CM

## 2020-07-14 NOTE — Therapy (Addendum)
[facility] [place], [place], [address] Phone: [phone]   Fax:  [phone]  Physical Therapy Treatment  Patient Details  Name: Donna Tucker MRN: [id number] Date of Birth: [birth year] Referring Provider (PT): Dr Dianah Field    Encounter Date: 07/14/2020   PT End of Session - 07/14/20 0729    Visit Number 8    Number of Visits 12    Date for PT Re-Evaluation 07/21/20    PT Start Time 0720    PT Stop Time 0805    PT Time Calculation (min) 45 min    Activity Tolerance Patient tolerated treatment well    Behavior During Therapy [facility] for tasks assessed/performed           Past Medical History:  Diagnosis Date  . Complication of anesthesia    post partum itching  . Gluten intolerance   . Graves' disease    endocrinologist--- dr Chalmers Cater-- dx 2012  . Hx of pre-eclampsia in prior pregnancy, currently pregnant   . Hx of varicella   . Hypothyroidism   . Pregnancy induced hypertension   . Tachycardia   . Wears glasses   . Wheat allergy     Past Surgical History:  Procedure Laterality Date  . CESAREAN SECTION N/A 10/02/2014   Procedure: CESAREAN SECTION;  Surgeon: Lovenia Kim, MD;  Location: [facility];  Service: Obstetrics;  Laterality: N/A;  . CESAREAN SECTION N/A 01/13/2020   Procedure: Repeat CESAREAN SECTION;  Surgeon: Brien Few, MD;  Location: [facility];  Service: Obstetrics;  Laterality: N/A;  EDD: 02/03/20  . THYROIDECTOMY N/A 11/02/2018   Procedure: TOTAL THYROIDECTOMY;  Surgeon: Armandina Gemma, MD;  Location: [facility];  Service: General;  Laterality: N/A;    There were no vitals filed for this visit.   Subjective Assessment - 07/14/20 0724    Subjective Pt reports she has been having "walking meetings" for work.  However at end of meeting Wednesday her Rt plantar fascia began hurting. She visited urgent care Thurs when pain did not resolve. Pt reports pain in Rt foot when she goes down  stairs.  Her Lt heel is sore/tired as well. She is using nitro patch on Lt heel.  She took heel lifts out of shoes, "my L foot feels better without it".   Currently in Pain? No/denies    Pain Score 0-No pain              [facility] PT Assessment - 07/14/20 0001      Assessment   Medical Diagnosis Lt Achille's tendinitis     Referring Provider (PT) Dr Dianah Field     Onset Date/Surgical Date 02/10/20    Hand Dominance Right    Prior Therapy none             [facility] Adult PT Treatment/Exercise - 07/14/20 0001      Manual Therapy   Manual therapy comments I strip of reg Rock tape applied to Rt plantar surface of foot, 2 strips perpendiculalr near calcaneus, for support to arch.     Soft tissue mobilization IASTM and STM to Lt calf, Rt plantar fascia and calf to decrease fascial restrictions and improve mobility      Ankle Exercises: Seated   Towel Crunch 5 reps;Weights bilat   Towel Crunch Weights (lbs) 2    Towel Inversion/Eversion 5 reps;Weights;Limitations   each foot   Towel Inversion/Eversion Weights (lbs) 2    Towel Inversion/Eversion Limitations Rt foot did not  tolerate wt on towel    Other Seated Ankle Exercises arch isometric x 5 sec x 5 reps bilat      Ankle Exercises: Aerobic   Recumbent Bike 4 min:  L1-4       Ankle Exercises: Stretches   Plantar Fascia Stretch 1 rep;30 seconds    Soleus Stretch 1 rep;30 seconds   bilat   Gastroc Stretch 2 reps;30 seconds   bilat   Other Stretch quadruped with toes tucked to sitting back on heels for plantar fascia stretch x 20 sec    Other Stretch single high kneel to soleus/ plantar flexors stretch x 15 sec each foot forward.                   PT Education - 07/14/20 [phone]    Education Details HEP, self care    Person(s) Educated Patient    Methods Explanation    Comprehension Verbalized understanding               PT Long Term Goals - 06/23/20 0837      PT LONG TERM GOAL #1   Title Patient demonstrate full  pain free ankle Lt ROM    Time 6    Period Weeks    Status Achieved      PT LONG TERM GOAL #2   Title Normal gait pattern with no imp    Time 6    Period Weeks    Status Achieved      PT LONG TERM GOAL #3   Title 5/5 strength Lt plantar flexion with no pain with resistive testing    Time 5    Period Weeks    Status On-going      PT LONG TERM GOAL #4   Title return to walking and/or running program as tolerated for ~ 15-20 min with minimal to no increase in pain    Time 6    Period Weeks    Status Partially Met      PT LONG TERM GOAL #5   Title Independent in HEP    Time 6    Period Weeks    Status On-going      PT LONG TERM GOAL #6   Title Improve FOTO to </= 34% limitation    Time 6    Period Weeks    Status On-going                 Plan - 07/14/20 7672    Clinical Impression Statement Pt continues with some Lt calf tightness, but reduced symptoms at Achilles.  Now she presents with flare up of symptoms surrounding Rt ankle/toe plantar flexors (post tib, flexor hallicus longus and flexor digitorum longus); these structures tender with manual therapy.  Encouraged pt to switch cardio to bicycle/row/swim vs running for now, and continue stretches from HEP.  Will request referral from MD for Rt ankle/foot.  No new goals met.    Rehab Potential Good    PT Frequency 2x / week    PT Duration 6 weeks    PT Treatment/Interventions Patient/family education;ADLs/Self Care Home Management;Aquatic Therapy;Cryotherapy;Electrical Stimulation;Iontophoresis 71m/ml Dexamethasone;Moist Heat;Ultrasound;Gait training;Stair training;Functional mobility training;Therapeutic activities;Therapeutic exercise;Balance training;Neuromuscular re-education;Manual techniques;Passive range of motion;Dry needling;Taping;Vasopneumatic Device    PT Next Visit Plan end of POC; assess goals, FOTO   PT Home Exercise Plan [email or web address]   Consulted and Agree with Plan of Care Patient           Patient  will benefit from skilled  therapeutic intervention in order to improve the following deficits and impairments:  Abnormal gait, Decreased range of motion, Pain, Hypomobility, Impaired flexibility, Improper body mechanics, Decreased mobility, Decreased strength  Visit Diagnosis: Achilles tendinitis of left lower extremity  Other symptoms and signs involving the musculoskeletal system  Other abnormalities of gait and mobility     Problem List Patient Active Problem List   Diagnosis Date Noted  . Left Achilles tendinitis 06/03/2020  . 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 Postpartum care following cesarean delivery (1/31) 01/14/2020  . Cesarean delivery due to maternal disorder 01/13/2020  . Previous cesarean delivery affecting pregnancy 01/13/2020  . Gestational hypertension 10/01/2014  . Obesity 08/14/2013   Kerin Perna, PTA 07/14/20 8:33 AM  [facility] [place], [place], [address] Phone: [phone]   Fax:  [phone]  Name: Donna Tucker MRN: [id number] Date of Birth: 1986/01/08   PHYSICAL THERAPY DISCHARGE SUMMARY  Visits from Start of Care: 8  Current functional level related to goals / functional outcomes: See progress note for discharge status.    Remaining deficits: Unknown    Education / Equipment: HEP   Plan: Patient agrees to discharge.  Patient goals were met. Patient is being discharged due to being pleased with the current functional level.  ?????    Celyn P. Helene Kelp PT, MPH 09/24/20 12:41 PM

## 2020-07-14 NOTE — Patient Instructions (Signed)
Access Code: [email or web address]: [email or web address]: 08/02/2021Prepared by: [facility]ercises  Gastroc Stretch on Wall - 2 x daily - 7 x weekly - 1 sets - 3 reps - 30 sec hold  Soleus Stretch on Wall - 2 x daily - 7 x weekly - 1 sets - 3 reps - 30 sec hold  Hooklying Hamstring Stretch with Strap - 1 x daily - 7 x weekly - 1 sets - 3 reps - 20 hold  Supine Ankle Circles - 2 x daily - 7 x weekly - 1-2 sets - 10 reps  Seated Ankle Pumps - 2 x daily - 7 x weekly - 1-2 sets - 10 reps  Forward T - 1 x daily - 3 x weekly - 1-2 sets - 10 reps  Single Leg Balance with Clock Reach - 1 x daily - 3 x weekly - 2 sets - 5 reps  Standing Eccentric Heel Raise - 1 x daily - 7 x weekly - 2 sets - 10 reps  Single Leg Squat with Chair Touch - 1 x daily - 7 x weekly - 1 sets - 10 reps  Sidelying Hip Abduction - 1 x daily - 7 x weekly - 3 sets - 10 reps  Supine Piriformis Stretch with Foot on Ground - 1 x daily - 7 x weekly - 1 sets - 2 reps - 20 sec hold  Towel Scrunches - 1 x daily - 7 x weekly - 2 sets - 10 reps  Ankle Inversion Eversion Towel Slide - 1 x daily - 7 x weekly - 2 sets - 10 reps  Half Kneel Ankle Dorsiflexion Self-Mobilization - 1 x daily - 7 x weekly - 1 sets - 3 reps - 30 hold  Kneeling Dorsiflexion Stretch - 1 x daily - 7 x weekly - 1 sets - 3 reps - 30 hold

## 2020-07-14 NOTE — Telephone Encounter (Signed)
-----   Message from Salvadore Oxford sent at 07/14/2020  8:34 AM EDT ----- Hi Dr. Karie Schwalbe,  I saw Donna Tucker this morning.  Her Lt Achilles is much better, however she was seen in urgent care for her Rt foot on Thurs.  Would it be possible to get referral for her Rt ankle, or would you like to see her in office first? Her next scheduled visit with [place] is 07/21/20.    Mayer Camel, PTA 07/14/20 8:36 AM

## 2020-07-21 ENCOUNTER — Ambulatory Visit: Payer: 59 | Admitting: Rehabilitative and Restorative Service Providers"

## 2020-07-22 DIAGNOSIS — F411 Generalized anxiety disorder: Secondary | ICD-10-CM | POA: Diagnosis not present

## 2020-07-24 ENCOUNTER — Ambulatory Visit (INDEPENDENT_AMBULATORY_CARE_PROVIDER_SITE_OTHER): Payer: 59 | Admitting: Medical-Surgical

## 2020-07-24 ENCOUNTER — Encounter: Payer: Self-pay | Admitting: Medical-Surgical

## 2020-07-24 VITALS — BP 132/91 | HR 62 | Temp 98.0°F | Ht 64.5 in | Wt 235.1 lb

## 2020-07-24 DIAGNOSIS — Z1159 Encounter for screening for other viral diseases: Secondary | ICD-10-CM | POA: Diagnosis not present

## 2020-07-24 DIAGNOSIS — Z862 Personal history of diseases of the blood and blood-forming organs and certain disorders involving the immune mechanism: Secondary | ICD-10-CM

## 2020-07-24 DIAGNOSIS — E46 Unspecified protein-calorie malnutrition: Secondary | ICD-10-CM | POA: Diagnosis not present

## 2020-07-24 DIAGNOSIS — R030 Elevated blood-pressure reading, without diagnosis of hypertension: Secondary | ICD-10-CM | POA: Diagnosis not present

## 2020-07-24 DIAGNOSIS — O99345 Other mental disorders complicating the puerperium: Secondary | ICD-10-CM | POA: Diagnosis not present

## 2020-07-24 DIAGNOSIS — Z6839 Body mass index (BMI) 39.0-39.9, adult: Secondary | ICD-10-CM

## 2020-07-24 DIAGNOSIS — Z7689 Persons encountering health services in other specified circumstances: Secondary | ICD-10-CM

## 2020-07-24 DIAGNOSIS — E559 Vitamin D deficiency, unspecified: Secondary | ICD-10-CM | POA: Diagnosis not present

## 2020-07-24 DIAGNOSIS — F53 Postpartum depression: Secondary | ICD-10-CM

## 2020-07-24 DIAGNOSIS — F419 Anxiety disorder, unspecified: Secondary | ICD-10-CM | POA: Diagnosis not present

## 2020-07-24 DIAGNOSIS — Z13220 Encounter for screening for lipoid disorders: Secondary | ICD-10-CM | POA: Diagnosis not present

## 2020-07-24 DIAGNOSIS — E890 Postprocedural hypothyroidism: Secondary | ICD-10-CM

## 2020-07-24 DIAGNOSIS — F32A Depression, unspecified: Secondary | ICD-10-CM | POA: Insufficient documentation

## 2020-07-24 HISTORY — DX: Elevated blood-pressure reading, without diagnosis of hypertension: R03.0

## 2020-07-24 HISTORY — DX: Body mass index (BMI) 39.0-39.9, adult: Z68.39

## 2020-07-24 MED ORDER — WEGOVY 0.25 MG/0.5ML ~~LOC~~ SOAJ: 0.2500 mg | SUBCUTANEOUS | 0 refills | Status: DC

## 2020-07-24 NOTE — Progress Notes (Signed)
New Patient Office Visit  Subjective:  Patient ID: Donna Tucker, female    DOB: 1986-02-27  Age: 34 y.o. MRN: [id number]  CC:  Chief Complaint  Patient presents with  . Establish Care    HPI DELAYLA Tucker presents to establish care.  Anxiety/depression-develop postpartum depression after the birth of her child in January.  Currently taking Zoloft 25mg  daily, tolerating well without side effects.  Feels that this does help her symptoms of depression as well as anxiety and is happy with the current dose.  She is also attending counseling once monthly and feels this has been helpful.  BP-noticed that her blood pressure has been labile lately.  Elevated today in office but an appointment a few weeks ago was 110/70.  Monitors regularly at home with readings in the 120s/70s-130s/80s. Previously treated for high blood pressure with Labetalol starting halfway through her most recent pregnancy. No current medication. Trying to eat a healthy low sodium diet.  Denies chest pain, shortness of breath, lower extremity edema, palpitations, dizziness, and headaches.  Weight concerns-very concerned about her weight.  Notes that she had weight struggles since her ' disease came out of remission requiring aggressive treatment.  She notes that she packed on quite a few pounds in a very short time prior to having her thyroid removed.  Shortly after, she became pregnant.  She gave birth in January and has been working to lose weight in general.  She was able to lose her baby weight plus about 10 pounds but now she seems to be at a plateau.  She is exercising doing walking 3 miles per day 5 days a week.  Her goal is to resume running half marathons as she used to.  She is also going to physical therapy for bilateral foot problems.  She does take going to the gym so does not do regular body weight exercises.  Her goal weight is around 190 pounds or lower.  She is on a vegan, lactose and gluten-free diet but does  try to make sure she gets plenty of protein.  Previously treated several years ago with a medicine for weight loss where she did fairly well but is concerned about a medication causing a worsening of her blood pressure elevation.  Hypothyroidism- managed by endocrinology s/p thyroidectomy for Graves' disease.  Vitamin D deficiency- managed by endocrinology. On oral supplementation currently. Past Medical History:  Diagnosis Date  . Anxiety   . Complication of anesthesia    post partum itching  . Depression   . Gluten intolerance   . Graves' disease    endocrinologist--- dr February-- dx 2012  . Hx of pre-eclampsia in prior pregnancy, currently pregnant   . Hx of varicella   . Hypothyroidism   . Pregnancy induced hypertension   . Tachycardia   . Wears glasses   . Wheat allergy     Past Surgical History:  Procedure Laterality Date  . CESAREAN SECTION N/A 10/02/2014   Procedure: CESAREAN SECTION;  Surgeon: 10/04/2014, MD;  Location: [facility];  Service: Obstetrics;  Laterality: N/A;  . CESAREAN SECTION N/A 01/13/2020   Procedure: Repeat CESAREAN SECTION;  Surgeon: 01/15/2020, MD;  Location: [facility];  Service: Obstetrics;  Laterality: N/A;  EDD: 02/03/20  . THYROIDECTOMY N/A 11/02/2018   Procedure: TOTAL THYROIDECTOMY;  Surgeon: 11/04/2018, MD;  Location: [facility];  Service: General;  Laterality: N/A;    Family History  Problem Relation Age of Onset  . Diabetes Mother  T1DM  . Stroke Mother   . Hypothyroidism Mother   . Hypertension Father   . Graves' disease Maternal Aunt   . Cancer Maternal Aunt         breast  . Cancer Maternal Grandmother   . Graves' disease Cousin   . Cancer Cousin   . Depression Brother   . Graves' disease Paternal Grandfather     Social History   Socioeconomic History  . Marital status: Married    Spouse name: Not on file  . Number of children: Not on file  . Years of education: Not on file  . Highest education level: Not on file   Occupational History  . Not on file  Tobacco Use  . Smoking status: Never Smoker  . Smokeless tobacco: Never Used  Vaping Use  . Vaping Use: Never used  Substance and Sexual Activity  . Alcohol use: Yes    Alcohol/week: 3.0 standard drinks    Types: 3 Glasses of wine per week  . Drug use: Never  . Sexual activity: Yes    Partners: Male    Birth control/protection: Pill  Other Topics Concern  . Not on file  Social History Narrative  . Not on file   Social Determinants of Health   Financial Resource Strain:   . Difficulty of Paying Living Expenses:   Food Insecurity:   . Worried About Programme researcher, broadcasting/film/video in the Last Year:   . Barista in the Last Year:   Transportation Needs:   . Freight forwarder (Medical):   Marland Kitchen Lack of Transportation (Non-Medical):   Physical Activity:   . Days of Exercise per Week:   . Minutes of Exercise per Session:   Stress:   . Feeling of Stress :   Social Connections:   . Frequency of Communication with Friends and Family:   . Frequency of Social Gatherings with Friends and Family:   . Attends Religious Services:   . Active Member of Clubs or Organizations:   . Attends Banker Meetings:   Marland Kitchen Marital Status:   Intimate Partner Violence:   . Fear of Current or Ex-Partner:   . Emotionally Abused:   Marland Kitchen Physically Abused:   . Sexually Abused:     ROS Review of Systems  Constitutional: Negative for chills, fatigue, fever and unexpected weight change.  Eyes: Negative for visual disturbance.  Respiratory: Negative for cough, chest tightness, shortness of breath and wheezing.   Cardiovascular: Negative for chest pain, palpitations and leg swelling.  Gastrointestinal: Negative for abdominal pain, constipation, diarrhea, nausea and vomiting.  Endocrine: Negative for cold intolerance, heat intolerance, polydipsia, polyphagia and polyuria.  Neurological: Negative for dizziness, light-headedness and headaches.   Psychiatric/Behavioral: Negative for dysphoric mood, self-injury and suicidal ideas. The patient is not nervous/anxious.     Objective:   Today's Vitals: BP (!) 132/91   Pulse 62   Temp 98 F (36.7 C) (Oral)   Ht 5' 4.5" (1.638 m)   Wt 235 lb 1.6 oz (106.6 kg)   LMP 07/09/2020 (Exact Date)   SpO2 98%   BMI 39.73 kg/m   Physical Exam Vitals reviewed.  Constitutional:      General: She is not in acute distress.    Appearance: Normal appearance.  HENT:     Head: Normocephalic and atraumatic.  Cardiovascular:     Rate and Rhythm: Normal rate and regular rhythm.     Pulses: Normal pulses.     Heart sounds:  Normal heart sounds. No murmur heard.  No friction rub. No gallop.   Pulmonary:     Effort: Pulmonary effort is normal. No respiratory distress.     Breath sounds: Normal breath sounds. No wheezing.  Skin:    General: Skin is warm and dry.  Neurological:     Mental Status: She is alert and oriented to person, place, and time.  Psychiatric:        Mood and Affect: Mood normal.        Behavior: Behavior normal.        Thought Content: Thought content normal.        Judgment: Judgment normal.     Assessment & Plan:   1. Encounter to establish care Reviewed available records and discussed health concerns. Up to date on pap smear.   2. Anxiety Continue Zoloft 25 mg daily.  Continue counseling as scheduled.  We will go ahead and check a CBC and CMP to make sure there are no contributing factors to her anxiety.  3. Postpartum depression Continue Zoloft 25 mg daily.  Continue counseling as scheduled.  4. Screening for lipid disorders Checking lipid panel today. - Lipid panel  5. History of anemia Rechecking CBC.  Last hemoglobin in February was 11.2. - CBC  6. Protein deficiency (HCC) Checking CMP.   7. Encounter for hepatitis C screening test for low risk patient Discussed screening recommendations with patient.  She is agreeable so we will add this to blood  work today. - Hepatitis C antibody  8. Elevated blood-pressure reading without diagnosis of hypertension Discussed blood pressure parameters and recommendations.  Recheck of blood pressure 132/91.  Home readings and recent in office blood pressures appear to be within recommended range.  Advised patient to continue monitoring at home over the next couple of weeks.  The goal should be 130/80 or less.  If she notes that her home readings are continuing to run higher than goal, I am perfectly okay with starting her on hydrochlorothiazide 12.5 mg daily.  Advised patient to contact me if her blood pressures are elevated and I will call that in without an additional appointment.  9. Vitamin D deficiency Continue oral vitamin D.  Followed by endocrinology.  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  11. BMI 39.0-39.9,adult Discussed weight loss options including side effects and benefits.  We are going to start with [facility] weekly injections.  Discussed medication administration, side effects that she may see, and expectations for progression through dose changes.  Patient verbalized understanding is agreeable to the plan.  Outpatient Encounter Medications as of 07/24/2020  Medication Sig  . levothyroxine (SYNTHROID) 125 MCG tablet Take 125 mcg by mouth every morning.  . nitroGLYCERIN (NITRODUR - DOSED IN MG/24 HR) 0.2 mg/hr patch Cut and apply 1/4 patch to most painful area q24h.  . norethindrone (MICRONOR) 0.35 MG tablet Take 1 tablet by mouth daily.  . Prenatal Vit-Fe Fumarate-FA (PRENATAL MULTIVITAMIN) TABS tablet Take 1 tablet by mouth at bedtime.  . sertraline (ZOLOFT) 25 MG tablet Take 25 mg by mouth daily.  . Semaglutide-Weight Management (WEGOVY) 0.25 MG/0.5ML SOAJ Inject 0.25 mg into the skin once a week.   No facility-administered encounter medications on file as of 07/24/2020.    Follow-up: Return in about 4 weeks (around 08/21/2020) for weight check on Wegovy.   Thayer Ohm, DNP, APRN, FNP-BC [facility]

## 2020-08-21 ENCOUNTER — Ambulatory Visit (INDEPENDENT_AMBULATORY_CARE_PROVIDER_SITE_OTHER): Payer: 59 | Admitting: Medical-Surgical

## 2020-08-21 ENCOUNTER — Encounter: Payer: Self-pay | Admitting: Medical-Surgical

## 2020-08-21 VITALS — BP 128/91 | HR 78 | Temp 98.1°F | Ht 64.5 in | Wt 236.8 lb

## 2020-08-21 DIAGNOSIS — R030 Elevated blood-pressure reading, without diagnosis of hypertension: Secondary | ICD-10-CM

## 2020-08-21 DIAGNOSIS — Z6841 Body Mass Index (BMI) 40.0 and over, adult: Secondary | ICD-10-CM | POA: Diagnosis not present

## 2020-08-21 MED ORDER — WEGOVY 0.5 MG/0.5ML ~~LOC~~ SOAJ: 0.5000 mg | SUBCUTANEOUS | 0 refills | Status: DC

## 2020-08-21 NOTE — Progress Notes (Signed)
Subjective:    CC: weight check  HPI: Pleasant 34 year old female presenting today for follow-up weight check on with Covid.  She started with IV 4 weeks ago and has tolerated the medication well.  She did note a slight bit of nausea approximately 12 hours after the first injection but no further issues after that.  Appetite has been noticeably decreased, no problems with constipation.  She is currently eating a vegan diet with no added salt.  Avoids fast foods and eating out at restaurants.  [place] for approximately 1500 cal/day.  She has been increasingly active lately playing with her kids, running around with them and lifting them.  She is also still walking for exercise regularly.  She drinks about 3 of her yeti cups full of water per day and avoids sodas and sweetened drinks.  Admits that she has not been sleeping very well.  Reports falling asleep okay but waking after a couple of hours.  When she wakes at night, she notes that her mind is very active and she is thinking about the coming day which interferes with her ability to fall back to sleep.   Blood pressure better systolically but diastolic remains slightly elevated.  Does not check blood pressures at home.  Does not add salt to her food regularly.  Denies chest pain, palpitations, shortness of breath, dizziness, headaches, and lower extremity edema.  I reviewed the past medical history, family history, social history, surgical history, and allergies today and no changes were needed.  Please see the problem list section below in epic for further details.  Past Medical History: Past Medical History:  Diagnosis Date  . Anxiety   . Complication of anesthesia    post partum itching  . Depression   . Gluten intolerance   . Graves' disease    endocrinologist--- dr Talmage Nap-- dx 2012  . Hx of pre-eclampsia in prior pregnancy, currently pregnant   . Hx of varicella   . Hypothyroidism   . Pregnancy induced hypertension   . Tachycardia   .  Wears glasses   . Wheat allergy    Past Surgical History: Past Surgical History:  Procedure Laterality Date  . CESAREAN SECTION N/A 10/02/2014   Procedure: CESAREAN SECTION;  Surgeon: Lenoard Aden, MD;  Location: [facility];  Service: Obstetrics;  Laterality: N/A;  . CESAREAN SECTION N/A 01/13/2020   Procedure: Repeat CESAREAN SECTION;  Surgeon: Olivia Mackie, MD;  Location: [facility];  Service: Obstetrics;  Laterality: N/A;  EDD: 02/03/20  . THYROIDECTOMY N/A 11/02/2018   Procedure: TOTAL THYROIDECTOMY;  Surgeon: Darnell Level, MD;  Location: [facility];  Service: General;  Laterality: N/A;   Social History: Social History   Socioeconomic History  . Marital status: Married    Spouse name: Not on file  . Number of children: Not on file  . Years of education: Not on file  . Highest education level: Not on file  Occupational History  . Not on file  Tobacco Use  . Smoking status: Never Smoker  . Smokeless tobacco: Never Used  Vaping Use  . Vaping Use: Never used  Substance and Sexual Activity  . Alcohol use: Yes    Alcohol/week: 3.0 standard drinks    Types: 3 Glasses of wine per week  . Drug use: Never  . Sexual activity: Yes    Partners: Male    Birth control/protection: Pill  Other Topics Concern  . Not on file  Social History Narrative  . Not on file  Social Determinants of Health   Financial Resource Strain:   . Difficulty of Paying Living Expenses: Not on file  Food Insecurity:   . Worried About Programme researcher, broadcasting/film/video in the Last Year: Not on file  . Ran Out of Food in the Last Year: Not on file  Transportation Needs:   . Lack of Transportation (Medical): Not on file  . Lack of Transportation (Non-Medical): Not on file  Physical Activity:   . Days of Exercise per Week: Not on file  . Minutes of Exercise per Session: Not on file  Stress:   . Feeling of Stress : Not on file  Social Connections:   . Frequency of Communication with Friends and Family: Not on file  .  Frequency of Social Gatherings with Friends and Family: Not on file  . Attends Religious Services: Not on file  . Active Member of Clubs or Organizations: Not on file  . Attends Banker Meetings: Not on file  . Marital Status: Not on file   Family History: Family History  Problem Relation Age of Onset  . Diabetes Mother        T1DM  . Stroke Mother   . Hypothyroidism Mother   . Hypertension Father   . Graves' disease Maternal Aunt   . Cancer Maternal Aunt         breast  . Cancer Maternal Grandmother   . Graves' disease Cousin   . Cancer Cousin   . Depression Brother   . Graves' disease Paternal Grandfather    Allergies: Allergies  Allergen Reactions  . Nsaids Anaphylaxis, Hives and Swelling    " all nsaids"  . Salicylates Anaphylaxis, Hives and Swelling  . Gluten Meal   . Lactose Intolerance (Gi)   . Oxycodone Hives  . Wheat Bran Hives    " all wheat products"   Medications: See med rec.  Review of Systems: See HPI for pertinent positives and negatives.   Objective:    General: Well Developed, well nourished, and in no acute distress.  Neuro: Alert and oriented x3.  HEENT: Normocephalic, atraumatic.  Skin: Warm and dry. Cardiac: Regular rate and rhythm, no murmurs rubs or gallops, no lower extremity edema.  Respiratory: Clear to auscultation bilaterally. Not using accessory muscles, speaking in full sentences.  Impression and Recommendations:    1. BMI 40.0-44.9, adult (HCC) Tolerating mobility well so we will go ahead and increase to the 0.5 mg SQ weekly.  Discussed potential causes for retaining weight despite dietary and lifestyle changes.  Suspect her lack of sleep may be causing increased cortisol levels.  Discussed adding melatonin at night to see if this will help her sleep a little better.  Increase fluid intake to aim for 4 large Yeti cups of water a day or more.  May benefit from calorie reduction to 1200 cal/day.  Make sure to get enough  protein.  Continue to work on increased exercise.  2. Elevated blood-pressure reading without diagnosis of hypertension Continues to have mild elevation of diastolic blood pressure but systolic has improved.  Discussed possible causes of elevated blood pressure.  Recommend evaluating dietary intake for hidden sodium.  Discussed possible medications for blood pressure management but we will wait 1 more month to see if she continues to improve without medical intervention.  Return in about 4 weeks (around 09/18/2020) for weight check. ___________________________________________ Thayer Ohm, DNP, APRN, FNP-BC Primary Care and Sports Medicine [facility] [place]

## 2020-08-25 DIAGNOSIS — F411 Generalized anxiety disorder: Secondary | ICD-10-CM | POA: Diagnosis not present

## 2020-09-01 MED FILL — LEVOTHYROXINE 125 MCG TABLE: 125 | 90 days supply | Qty: 90 | Fill #2

## 2020-09-18 ENCOUNTER — Ambulatory Visit: Payer: 59 | Admitting: Medical-Surgical

## 2020-09-25 DIAGNOSIS — F411 Generalized anxiety disorder: Secondary | ICD-10-CM | POA: Diagnosis not present

## 2020-10-06 MED FILL — SERTRALINE HCL 25 MG TABLET: 25 | 90 days supply | Qty: 90 | Fill #1

## 2020-10-24 DIAGNOSIS — F411 Generalized anxiety disorder: Secondary | ICD-10-CM | POA: Diagnosis not present

## 2020-12-02 ENCOUNTER — Other Ambulatory Visit (HOSPITAL_COMMUNITY): Payer: Self-pay | Admitting: Endocrinology

## 2020-12-02 MED FILL — LEVOTHYROXINE 125 MCG TABLE: 125 | 90 days supply | Qty: 90 | Fill #0

## 2020-12-18 DIAGNOSIS — F411 Generalized anxiety disorder: Secondary | ICD-10-CM | POA: Diagnosis not present

## 2020-12-26 ENCOUNTER — Other Ambulatory Visit: Payer: 59

## 2020-12-26 DIAGNOSIS — Z20822 Contact with and (suspected) exposure to covid-19: Secondary | ICD-10-CM

## 2020-12-30 LAB — NOVEL CORONAVIRUS, NAA: SARS-CoV-2, NAA: NOT DETECTED

## 2021-01-01 MED FILL — SERTRALINE HCL 25 MG TABLET: 25 | 90 days supply | Qty: 90 | Fill #2

## 2021-03-03 MED FILL — LEVOTHYROXINE 125 MCG TAB: 125 | 90 days supply | Qty: 90 | Fill #1

## 2021-03-04 ENCOUNTER — Other Ambulatory Visit (HOSPITAL_COMMUNITY): Payer: Self-pay | Admitting: Pharmacist

## 2021-03-04 MED FILL — CARESTART COVID-19 HOME TES: 4 days supply | Qty: 4 | Fill #0

## 2021-03-19 ENCOUNTER — Telehealth: Payer: 59 | Admitting: Physician Assistant

## 2021-03-19 DIAGNOSIS — U071 COVID-19: Secondary | ICD-10-CM

## 2021-03-19 MED ORDER — BENZONATATE 100 MG PO CAPS: 100.0000 mg | ORAL_CAPSULE | Freq: Three times a day (TID) | ORAL | 0 refills | Status: DC | PRN

## 2021-03-19 MED ORDER — ALBUTEROL SULFATE HFA 108 (90 BASE) MCG/ACT IN AERS: 2.0000 | INHALATION_SPRAY | Freq: Four times a day (QID) | RESPIRATORY_TRACT | 0 refills | Status: DC | PRN

## 2021-03-19 NOTE — Progress Notes (Signed)
I have spent 5 minutes in review of e-visit questionnaire, review and updating patient chart, medical decision making and response to patient.   Matix Henshaw Cody Drisana Schweickert, PA-C    

## 2021-03-19 NOTE — Progress Notes (Signed)
E-Visit for Positive Covid Test Result We are sorry you are not feeling well. We are here to help!  You have tested positive for COVID-19, meaning that you were infected with the novel coronavirus and could give the virus to others.  It is vitally important that you stay home so you do not spread it to others.      Please continue isolation at home, for at least 10 days since the start of your symptoms and until you have had 24 hours with no fever (without taking a fever reducer) and with improving of symptoms.  If you have no symptoms but tested positive (or all symptoms resolve after 5 days and you have no fever) you can leave your house but continue to wear a mask around others for an additional 5 days. If you have a fever,continue to stay home until you have had 24 hours of no fever. Most cases improve 5-10 days from onset but we have seen a small number of patients who have gotten worse after the 10 days.  Please be sure to watch for worsening symptoms and remain taking the proper precautions.   Go to the nearest hospital ED for assessment if fever/cough/breathlessness are severe or illness seems like a threat to life.    The following symptoms may appear 2-14 days after exposure: . Fever . Cough . Shortness of breath or difficulty breathing . Chills . Repeated shaking with chills . Muscle pain . Headache . Sore throat . New loss of taste or smell . Fatigue . Congestion or runny nose . Nausea or vomiting . Diarrhea  You have been enrolled in [facility] Monitoring for COVID-19. Daily you will receive a questionnaire within the MyChart website. Our COVID-19 response team will be monitoring your responses daily.  You can use medication such as prescription cough medication called Tessalon Perles 100 mg. You may take 1-2 capsules every 8 hours as needed for cough and  prescription inhaler called Albuterol MDI 90 mcg /actuation 2 puffs every 4 hours as needed for shortness of breath,  wheezing, cough  I have sent your information to the COVID treatment team so they can reach out to you to discuss starting Paxlovid and other treatment options.    You may also take acetaminophen (Tylenol) as needed for fever.  HOME CARE: . Only take medications as instructed by your medical team. . Drink plenty of fluids and get plenty of rest. . A steam or ultrasonic humidifier can help if you have congestion.   GET HELP RIGHT AWAY IF YOU HAVE EMERGENCY WARNING SIGNS.  Call 911 or proceed to your closest emergency facility if: . You develop worsening high fever. . Trouble breathing . Bluish lips or face . Persistent pain or pressure in the chest . New confusion . Inability to wake or stay awake . You cough up blood. . Your symptoms become more severe . Inability to hold down food or fluids  This list is not all possible symptoms. Contact your medical provider for any symptoms that are severe or concerning to you.    Your e-visit answers were reviewed by a board certified advanced clinical practitioner to complete your personal care plan.  Depending on the condition, your plan could have included both over the counter or prescription medications.  If there is a problem please reply once you have received a response from your provider.  Your safety is important to [place].  If you have drug allergies check your prescription carefully.  You can use MyChart to ask questions about today's visit, request a non-urgent call back, or ask for a work or school excuse for 24 hours related to this e-Visit. If it has been greater than 24 hours you will need to follow up with your provider, or enter a new e-Visit to address those concerns. You will get an e-mail in the next two days asking about your experience.  I hope that your e-visit has been valuable and will speed your recovery. Thank you for using e-visits.

## 2021-03-20 ENCOUNTER — Other Ambulatory Visit: Payer: Self-pay | Admitting: Oncology

## 2021-03-20 ENCOUNTER — Telehealth: Payer: Self-pay

## 2021-03-20 ENCOUNTER — Encounter: Payer: Self-pay | Admitting: Oncology

## 2021-03-20 MED ORDER — PREDNISONE 20 MG PO TABS: 20.0000 mg | ORAL_TABLET | Freq: Every day | ORAL | 0 refills | Status: DC

## 2021-03-20 MED ORDER — MOLNUPIRAVIR EUA 200MG CAPSULE: 4.0000 | ORAL_CAPSULE | Freq: Two times a day (BID) | ORAL | 0 refills | Status: AC

## 2021-03-20 NOTE — Progress Notes (Signed)
Outpatient Oral COVID Treatment Note  I connected with Donna Tucker on 03/20/2021/12:54 PM by telephone and verified that I am speaking with the correct person using two identifiers.  I discussed the limitations, risks, security, and privacy concerns of performing an evaluation and management service by telephone and the availability of in person appointments. I also discussed with the patient that there may be a patient responsible charge related to this service. The patient expressed understanding and agreed to proceed.  Patient location: Home Provider location: Clinic   Diagnosis: COVID-19 infection  Purpose of visit: Discussion of potential use of Molnupiravir or Paxlovid, a new treatment for mild to moderate COVID-19 viral infection in non-hospitalized patients.   Subjective: Patient is a 35 y.o. female who has been diagnosed with COVID 19 viral infection.  Their symptoms began on 03/19/21 with sore throat.  Past Medical History:  Diagnosis Date  . Anxiety   . Complication of anesthesia    post partum itching  . Depression   . Gluten intolerance   . Graves' disease    endocrinologist--- dr Chalmers Cater-- dx 2012  . Hx of pre-eclampsia in prior pregnancy, currently pregnant   . Hx of varicella   . Hypothyroidism   . Pregnancy induced hypertension   . Tachycardia   . Wears glasses   . Wheat allergy     Allergies  Allergen Reactions  . Nsaids Anaphylaxis, Hives and Swelling    " all nsaids"  . Salicylates Anaphylaxis, Hives and Swelling  . Gluten Meal   . Lactose Intolerance (Gi)   . Oxycodone Hives  . Wheat Bran Hives    " all wheat products"     Current Outpatient Medications:  .  albuterol (VENTOLIN HFA) 108 (90 Base) MCG/ACT inhaler, Inhale 2 puffs into the lungs every 6 (six) hours as needed for wheezing or shortness of breath., Disp: 8 g, Rfl: 0 .  benzonatate (TESSALON) 100 MG capsule, Take 1 capsule (100 mg total) by mouth 3 (three) times daily as needed for cough.,  Disp: 30 capsule, Rfl: 0 .  COVID-19 At Home Antigen Test KIT, USE AS DIRECTED, Disp: 4 kit, Rfl: 0 .  levothyroxine (SYNTHROID) 125 MCG tablet, Take 125 mcg by mouth every morning., Disp: , Rfl:  .  levothyroxine (SYNTHROID) 125 MCG tablet, TAKE 1 TABLET BY MOUTH IN THE MORNING ON AN EMPTY STOMACH, Disp: 90 tablet, Rfl: 4 .  nitroGLYCERIN (NITRODUR - DOSED IN MG/24 HR) 0.2 mg/hr patch, Cut and apply 1/4 patch to most painful area q24h., Disp: 30 patch, Rfl: 11 .  norethindrone (MICRONOR) 0.35 MG tablet, Take 1 tablet by mouth daily., Disp: , Rfl:  .  Prenatal Vit-Fe Fumarate-FA (PRENATAL MULTIVITAMIN) TABS tablet, Take 1 tablet by mouth at bedtime., Disp: , Rfl:  .  Semaglutide-Weight Management (WEGOVY) 0.5 MG/0.5ML SOAJ, Inject 0.5 mLs (0.5 mg total) into the skin once a week., Disp: 2 mL, Rfl: 0 .  sertraline (ZOLOFT) 25 MG tablet, Take 25 mg by mouth daily., Disp: , Rfl:  .  sertraline (ZOLOFT) 25 MG tablet, TAKE 1 TABLET BY MOUTH DAILY, Disp: 90 tablet, Rfl: 2  Objective: Patient appears/sounds well.  They are in no apparent distress.  Breathing is non labored.  Mood and behavior are normal.  Laboratory Data:  Recent Results (from the past 2160 hour(s))  Novel Coronavirus, NAA ([facility])     Status: None   Collection Time: 12/26/20 11:14 AM   Specimen: Nasopharyngeal(NP) swabs in vial transport medium   Nasopharynge  Result Value Ref Range   SARS-CoV-2, NAA Not Detected Not Detected    Comment: This nucleic acid amplification test was developed and its performance characteristics determined by [employer]. Nucleic acid amplification tests include RT-PCR and TMA. This test has not been FDA cleared or approved. This test has been authorized by FDA under an Emergency Use Authorization (EUA). This test is only authorized for the duration of time the declaration that circumstances exist justifying the authorization of the emergency use of in vitro diagnostic tests for detection  of SARS-CoV-2 virus and/or diagnosis of COVID-19 infection under section 564(b)(1) of the Act, 21 U.S.C. 258NID-7(O) (1), unless the authorization is terminated or revoked sooner. When diagnostic testing is negative, the possibility of a false negative result should be considered in the context of a patient's recent exposures and the presence of clinical signs and symptoms consistent with COVID-19. An individual without symptoms of COVID-19 and who is not shedding SARS-CoV-2 virus wo uld expect to have a negative (not detected) result in this assay.      Assessment: 35 y.o. female with mild/moderate COVID 19 viral infection diagnosed on 03/18/21 at high risk for progression to severe COVID 19.  Plan:  This patient is a 35 y.o. female that meets the following criteria for Emergency Use Authorization of: Molnupiravir  1. Age >18 yr 2. SARS-COV-2 positive test 3. Symptom onset < 5 days 4. Mild-to-moderate COVID disease with high risk for severe progression to hospitalization or death   I have spoken and communicated the following to the patient or parent/caregiver regarding: 1. Molnupiravir is an unapproved drug that is authorized for use under an [employer] production planner.  2. There are no adequate, approved, available products for the treatment of COVID-19 in adults who have mild-to-moderate COVID-19 and are at high risk for progressing to severe COVID-19, including hospitalization or death. 3. Other therapeutics are currently authorized. For additional information on all products authorized for treatment or prevention of COVID-19, please see [employer].  4. There are benefits and risks of taking this treatment as outlined in the "Fact Sheet for Patients and Caregivers."  5. "Fact Sheet for Patients and Caregivers" was reviewed with patient. A hard copy will be provided to  patient from pharmacy prior to the patient receiving treatment. 6. Patients should continue to self-isolate and use infection control measures (e.g., wear mask, isolate, social distance, avoid sharing personal items, clean and disinfect "high touch" surfaces, and frequent handwashing) according to CDC guidelines.  7. The patient or parent/caregiver has the option to accept or refuse treatment. 8. [place] has established a pregnancy surveillance program. 9. Females of childbearing potential should use a reliable method of contraception correctly and consistently, as applicable, for the duration of treatment and for 4 days after the last dose of Molnupiravir. 35. Males of reproductive potential who are sexually active with females of childbearing potential should use a reliable method of contraception correctly and consistently during treatment and for at least 3 months after the last dose. 11. Pregnancy status and risk was assessed. Patient verbalized understanding of precautions.   After reviewing above information with the patient, the patient agrees to receive molnupiravir.  Follow up instructions:    . Take prescription BID x 5 days as directed . Reach out to pharmacist for counseling on medication if desired . For concerns regarding further COVID symptoms please follow up with your PCP or urgent care . For urgent or life-threatening issues, seek care at your local emergency  department  The patient was provided an opportunity to ask questions, and all were answered. The patient agreed with the plan and demonstrated an understanding of the instructions.   The patient was advised to call their PCP or seek an in-person evaluation if the symptoms worsen or if the condition fails to improve as anticipated.   I provided 15 minutes of non face-to-face telephone visit time during this encounter, and > 50% was spent counseling as documented under my assessment & plan.  Jacquelin Hawking,  NP 03/20/2021 /12:54 PM

## 2021-03-20 NOTE — Telephone Encounter (Signed)
Called to discuss with patient about COVID-19 symptoms and the use of one of the available treatments for those with mild to moderate Covid symptoms and at a high risk of hospitalization.  Pt appears to qualify for outpatient treatment due to co-morbid conditions and/or a member of an at-risk group in accordance with the FDA Emergency Use Authorization.    Symptom onset: 03/17/21 Nasal congestion,weakness,cough Vaccinated: Yes Booster? Yes Immunocompromised?  Qualifiers: HTN  Pt. Would like to speak with APP.  Esther Hardy

## 2021-03-20 NOTE — Telephone Encounter (Signed)
Pt. States she feels weaker today. Has been referred to [facility]fusion Clinic for further treatment. Instructed to also reach out to her PCP as needed.

## 2021-04-06 ENCOUNTER — Other Ambulatory Visit: Payer: Self-pay | Admitting: Obstetrics and Gynecology

## 2021-04-06 ENCOUNTER — Other Ambulatory Visit (HOSPITAL_COMMUNITY): Payer: Self-pay

## 2021-04-06 MED ORDER — SERTRALINE HCL 25 MG PO TABS
ORAL_TABLET | Freq: Every day | ORAL | 2 refills | Status: DC
  Filled 2021-04-06: qty 90, 90d supply, fill #0
  Filled 2021-07-03: qty 90, 90d supply, fill #1
  Filled 2021-10-02: qty 90, 90d supply, fill #2

## 2021-05-29 ENCOUNTER — Ambulatory Visit (HOSPITAL_BASED_OUTPATIENT_CLINIC_OR_DEPARTMENT_OTHER): Payer: 59 | Admitting: Nurse Practitioner

## 2021-06-01 ENCOUNTER — Other Ambulatory Visit (HOSPITAL_COMMUNITY): Payer: Self-pay

## 2021-06-01 ENCOUNTER — Ambulatory Visit (INDEPENDENT_AMBULATORY_CARE_PROVIDER_SITE_OTHER): Payer: 59 | Admitting: Nurse Practitioner

## 2021-06-01 ENCOUNTER — Encounter (HOSPITAL_BASED_OUTPATIENT_CLINIC_OR_DEPARTMENT_OTHER): Payer: Self-pay | Admitting: Nurse Practitioner

## 2021-06-01 ENCOUNTER — Other Ambulatory Visit: Payer: Self-pay

## 2021-06-01 VITALS — BP 135/97 | HR 83 | Ht 65.0 in | Wt 245.0 lb

## 2021-06-01 DIAGNOSIS — Z0000 Encounter for general adult medical examination without abnormal findings: Secondary | ICD-10-CM

## 2021-06-01 DIAGNOSIS — Z6841 Body Mass Index (BMI) 40.0 and over, adult: Secondary | ICD-10-CM

## 2021-06-01 DIAGNOSIS — E46 Unspecified protein-calorie malnutrition: Secondary | ICD-10-CM | POA: Diagnosis not present

## 2021-06-01 DIAGNOSIS — I10 Essential (primary) hypertension: Secondary | ICD-10-CM

## 2021-06-01 DIAGNOSIS — E890 Postprocedural hypothyroidism: Secondary | ICD-10-CM

## 2021-06-01 DIAGNOSIS — T148XXA Other injury of unspecified body region, initial encounter: Secondary | ICD-10-CM

## 2021-06-01 DIAGNOSIS — R635 Abnormal weight gain: Secondary | ICD-10-CM

## 2021-06-01 DIAGNOSIS — E559 Vitamin D deficiency, unspecified: Secondary | ICD-10-CM | POA: Diagnosis not present

## 2021-06-01 DIAGNOSIS — R209 Unspecified disturbances of skin sensation: Secondary | ICD-10-CM | POA: Insufficient documentation

## 2021-06-01 DIAGNOSIS — Z131 Encounter for screening for diabetes mellitus: Secondary | ICD-10-CM

## 2021-06-01 HISTORY — DX: Unspecified disturbances of skin sensation: R20.9

## 2021-06-01 LAB — POCT GLYCOSYLATED HEMOGLOBIN (HGB A1C): HbA1c POC (<> result, manual entry): 4.6 % (ref 4.0–5.6)

## 2021-06-01 MED ORDER — HYDROCHLOROTHIAZIDE 12.5 MG PO TABS
12.5000 mg | ORAL_TABLET | Freq: Every day | ORAL | 1 refills | Status: DC
  Filled 2021-06-01: qty 90, 90d supply, fill #0

## 2021-06-01 MED ORDER — OZEMPIC (0.25 OR 0.5 MG/DOSE) 2 MG/1.5ML ~~LOC~~ SOPN
PEN_INJECTOR | SUBCUTANEOUS | 0 refills | Status: DC
  Filled 2021-06-01: qty 3, 63d supply, fill #0
  Filled 2021-07-30: qty 3, 63d supply, fill #1
  Filled 2021-08-20: qty 3, 63d supply, fill #2

## 2021-06-01 MED ORDER — ONDANSETRON HCL 8 MG PO TABS
8.0000 mg | ORAL_TABLET | Freq: Three times a day (TID) | ORAL | 2 refills | Status: DC | PRN
  Filled 2021-06-01: qty 30, 10d supply, fill #0

## 2021-06-01 NOTE — Assessment & Plan Note (Signed)
BP elevated today at 135/97- pt reports this is consistent with readings at home Start HCTZ 12.5mg  daily and monitor BP daily Will check-in via MyChart in 1 week with BP readings to determine if increased dose needed Labs today F/U in 3 months

## 2021-06-01 NOTE — Assessment & Plan Note (Signed)
Will monitor labs today.  

## 2021-06-01 NOTE — Progress Notes (Signed)
Worthy Keeler, DNP, AGNP-c Primary Care Services ______________________________________________________________________  HPI Donna Tucker is a 35 y.o. female presenting to [facility] at [place] today to establish care.   Patient Care Team: Mliss Wedin, Coralee Pesa, NP as PCP - General (Nurse Practitioner) Himmelrich, Bryson Ha, RD (Inactive) as Dietitian Brien Few, MD as Consulting Physician (Obstetrics and Gynecology) Anda Kraft, MD as Consulting Physician (Endocrinology) Dr. Chalmers Cater, Endocrinology    Concerns today: CPE Weight Gain Endorses history positive for Grave's Disease with subsequent thyroidectomy in 2019. At that time she reports that she began to gain weight and reached about 245 pounds.  She tells me she has tried  numerous methods to lose weight, but has been unsuccessful in her efforts to get below 245.  She endorses walking 3-5 miles at a time and monitoring her diet. She used to run, but due to foot injuries, she is unable to participate in that any longer.  She would like to discuss weight loss options today.   Easy Bruising She endorses easy bruising over the last 2-3 months that has not been present in the past. The only change she has noticed is that she stopped taking a pre-natal vitamin. She denies any other symptoms.  She does have a history positive for anemia, but not other blood disorders that she is aware of.  HTN She reports significant HTN while pregnant with her son about a year and a half ago.  She reports that in Jan 2021 she was taken off of her BP medications and since that time her BP has been elevated.  She denies headache, palpitations, shortness of breath, chest pain, fatigue.  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   PHQ9 Today: Depression screen [facility] 2/9 06/01/2021 07/24/2020  Decreased Interest 0 0  Down, Depressed, Hopeless 0 0  PHQ - 2 Score 0 0  Altered sleeping 3 1  Tired, decreased energy 1 1  Change in appetite 1 0  Feeling bad or failure about yourself  1 0  Trouble concentrating 0 0  Moving slowly or fidgety/restless 0 0  Suicidal thoughts 0 0  PHQ-9 Score 6 2  Difficult doing work/chores Not difficult at all Not difficult at all  Some encounter information is confidential and restricted. Go to Review Flowsheets activity to see all data.   GAD7 Today: GAD 7 : Generalized Anxiety Score 06/01/2021 07/24/2020  Nervous, Anxious, on Edge 1 1  Control/stop worrying 1 0  Worry too much - different things 0 1  Trouble relaxing 0 1  Restless 1 0  Easily annoyed or irritable 0 0  Afraid - awful might happen 0 1  Total GAD 7 Score 3 4  Anxiety Difficulty Not difficult at all Not difficult at all   ______________________________________________________________________ PMH Past Medical History:  Diagnosis Date   Anxiety    BMI 39.0-39.9,adult 1/85/5015   Complication of anesthesia  post partum itching   Depression    Elevated blood-pressure reading without diagnosis of hypertension 07/24/2020   Gestational hypertension 10/01/2014   Gluten intolerance    Graves' disease    endocrinologist--- dr Chalmers Cater-- dx 2012   Hx of pre-eclampsia in prior pregnancy, currently pregnant    Hx of varicella    Hypothyroidism    Obesity 08/14/2013   Postpartum care following cesarean delivery  (1/31) 01/14/2020   Pregnancy induced hypertension    Previous cesarean delivery affecting pregnancy 01/13/2020   Skin sensation disturbance 06/01/2021   Tachycardia    Wears glasses    Wheat allergy     ROS All review of systems negative except what is listed in the HPI  PHYSICAL EXAM Physical Exam Vitals and nursing note reviewed.  Constitutional:      Appearance: Normal appearance. She is obese.  HENT:     Head: Normocephalic and atraumatic.     Right Ear: Tympanic membrane, ear canal and external ear normal.     Left Ear: Tympanic membrane, ear canal and external ear normal.     Nose: Nose normal.     Mouth/Throat:     Mouth: Mucous membranes are moist.     Pharynx: Oropharynx is clear.  Eyes:     Extraocular Movements: Extraocular movements intact.     Conjunctiva/sclera: Conjunctivae normal.     Pupils: Pupils are equal, round, and reactive to light.  Neck:     Vascular: No carotid bruit.  Cardiovascular:     Rate and Rhythm: Normal rate and regular rhythm.     Pulses: Normal pulses.     Heart sounds: Normal heart sounds.  Pulmonary:     Effort: Pulmonary effort is normal.     Breath sounds: Normal breath sounds.  Abdominal:     General: Abdomen is flat. Bowel sounds are normal. There is no distension.     Palpations: Abdomen is soft.     Tenderness: There is no abdominal tenderness. There is no right CVA tenderness, left CVA tenderness, guarding or rebound.  Musculoskeletal:        General: Normal range of motion.     Cervical back: Normal range of motion and neck supple.     Right lower leg: No edema.     Left lower leg: No edema.  Lymphadenopathy:     Cervical: No cervical adenopathy.  Skin:    General: Skin is warm and dry.     Capillary Refill: Capillary refill takes less than 2 seconds.  Neurological:     General: No focal deficit present.     Mental Status: She is alert and oriented to person, place, and time.     Cranial Nerves: No cranial nerve  deficit.     Sensory: No sensory deficit.     Motor: No weakness.     Coordination: Coordination normal.     Gait: Gait normal.  Psychiatric:        Mood and Affect: Mood normal.        Behavior: Behavior normal.        Thought Content: Thought content normal.        Judgment: Judgment normal.   ______________________________________________________________________ ASSESSMENT AND PLAN Problem List Items Addressed This Visit     Postoperative hypothyroidism    Managed by endocrinology Reported labs WNL- will review medical records No indication that condition is affecting weight gain given the historical description. No changes to plan of care today Will follow  Protein deficiency ([place])    Will monitor labs today       Vitamin D deficiency    Will monitor labs today       Relevant Orders   VITAMIN D 25 Hydroxy (Vit-D Deficiency, Fractures)   Hypertension    BP elevated today at 135/97- pt reports this is consistent with readings at home Start HCTZ 12.70m daily and monitor BP daily Will check-in via MyChart in 1 week with BP readings to determine if increased dose needed Labs today F/U in 3 months       Relevant Medications   hydrochlorothiazide (HYDRODIURIL) 12.5 MG tablet   Other Relevant Orders   CBC with Differential/Platelet   Comprehensive metabolic panel   Encounter for medical examination to establish care - Primary    Review of current and past medical history, social history, medication, and family history.  Review of care gaps and health maintenance recommendations.  Records from recent providers to be requested if not available in Chart Review or Care Everywhere.  Recommendations for health maintenance, diet, and exercise provided.  CPE today with labs. Recommend 4th COVID booster for high BMI F/U in 1 year for CPE        Weight gain    Will monitor labs today. See BMI 40.0-44.9 for full A&P for weight       Relevant Medications    Semaglutide,0.25 or 0.5MG/DOS, (OZEMPIC, 0.25 OR 0.5 MG/DOSE,) 2 MG/1.5ML SOPN   Bruising    Unknown etiology. No significant bruising evident today Possible correlation between pre-natal vitamin intake  Will monitor labs today and make changes to plan of care as appropriate       BMI 40.0-44.9, adult (H[facility])    BMI 40.77 today Given the patients hypertensive status, medical weight management is recommended today in order to reduce RF Discussed GLP-1 medications with patient and joint decision made to start semaglutide 0.23minjections weekly x 4 weeks then increase to 0.67m367meekly x 4 weeks, then 1mg16mekly x 4 weeks.  3 months supply of medication provided with coupon card for $25 Rx  Zofran provided for intermittent nausea Recommend small frequent meals throughout the day and increased water intake. Will obtain baseline labs today Will plan to switch to [facility] is re-[facility] with product. F/U in 3 months       Relevant Medications   Semaglutide,0.25 or 0.5MG/DOS, (OZEMPIC, 0.25 OR 0.5 MG/DOSE,) 2 MG/1.5ML SOPN   Other Visit Diagnoses     Screening for diabetes mellitus       Relevant Orders   Comprehensive metabolic panel   POCT glycosylated hemoglobin (Hb A1C) (Completed)       Education provided today during visit and on AVS for patient to review at home.  Diet and Exercise recommendations provided.  Current diagnoses and recommendations discussed. HM recommendations reviewed with recommendations.    Outpatient Encounter Medications as of 06/01/2021  Medication Sig   hydrochlorothiazide (HYDRODIURIL) 12.5 MG tablet Take 1 tablet (12.5 mg total) by mouth daily.   levothyroxine (SYNTHROID) 125 MCG tablet TAKE 1 TABLET BY MOUTH IN THE MORNING ON AN EMPTY STOMACH   ondansetron (ZOFRAN) 8 MG tablet Take 1 tablet (8 mg total) by mouth every 8 (eight) hours as needed for nausea or vomiting.   Semaglutide,0.25 or 0.5MG/DOS, (OZEMPIC, 0.25 OR 0.5  MG/DOSE,) 2 MG/1.5ML SOPN Inject 0.267mg99mo the abdominal tissue once weekly for 4 weeks THEN inject 0.67mg o66m weekly for 4 weeks THEN inject 1mg on767mweekly for  4 weeks.   sertraline (ZOLOFT) 25 MG tablet TAKE 1 TABLET BY MOUTH DAILY   [DISCONTINUED] acetaminophen (TYLENOL) 325 MG tablet 1 tablet as needed   [DISCONTINUED] cholecalciferol (VITAMIN D3) 25 MCG (1000 UNIT) tablet Take 1 tablet by mouth daily.   [DISCONTINUED] sertraline (ZOLOFT) 25 MG tablet 1 tablet   [DISCONTINUED] albuterol (VENTOLIN HFA) 108 (90 Base) MCG/ACT inhaler Inhale 2 puffs into the lungs every 6 (six) hours as needed for wheezing or shortness of breath.   [DISCONTINUED] benzonatate (TESSALON) 100 MG capsule Take 1 capsule (100 mg total) by mouth 3 (three) times daily as needed for cough.   [DISCONTINUED] COVID-19 At Home Antigen Test KIT USE AS DIRECTED   [DISCONTINUED] levothyroxine (SYNTHROID) 125 MCG tablet Take 125 mcg by mouth every morning.   [DISCONTINUED] nitroGLYCERIN (NITRODUR - DOSED IN MG/24 HR) 0.2 mg/hr patch Cut and apply 1/4 patch to most painful area q24h.   [DISCONTINUED] norethindrone (MICRONOR) 0.35 MG tablet Take 1 tablet by mouth daily.   [DISCONTINUED] predniSONE (DELTASONE) 20 MG tablet Take 1 tablet (20 mg total) by mouth daily with breakfast.   [DISCONTINUED] Prenatal Vit-Fe Fumarate-FA (PRENATAL MULTIVITAMIN) TABS tablet Take 1 tablet by mouth at bedtime.   [DISCONTINUED] Semaglutide-Weight Management (WEGOVY) 0.5 MG/0.5ML SOAJ Inject 0.5 mLs (0.5 mg total) into the skin once a week.   [DISCONTINUED] sertraline (ZOLOFT) 25 MG tablet Take 25 mg by mouth daily.   No facility-administered encounter medications on file as of 06/01/2021.    Return in about 3 months (around 09/01/2021) for Virtual or In Person- Weight.  Time: 65 minutes, >50% spent counseling, care coordination, chart review, and documentation.   Orma Render, DNP, AGNP-c

## 2021-06-01 NOTE — Assessment & Plan Note (Signed)
BMI 40.77 today Given the patients hypertensive status, medical weight management is recommended today in order to reduce RF Discussed GLP-1 medications with patient and joint decision made to start semaglutide 0.25mg  injections weekly x 4 weeks then increase to 0.5mg  weekly x 4 weeks, then 1mg  weekly x 4 weeks.  3 months supply of medication provided with coupon card for $25 Rx  Zofran provided for intermittent nausea Recommend small frequent meals throughout the day and increased water intake. Will obtain baseline labs today Will plan to switch to [facility] once manufacturing is re-established with product. F/U in 3 months

## 2021-06-01 NOTE — Patient Instructions (Addendum)
Recommendations from today's visit: I will let you know about your lab results through mychart- if there is anything we need to change, we will call you Send me a mychart in 1 week with your blood pressure readings and we will decide where to go.  Follow-up in 3 months- in person or virtual for weight   Information on diet, exercise, and health maintenance recommendations are listed below. This is information to help you be sure you are on track for optimal health and monitoring.   Please look over this and let us know if you have any questions or if you have completed any of the health maintenance outside of [place] so that we can be sure your records are up to date.  ___________________________________________________________  Thank you for choosing [place] at [facility] for your Primary Care needs. I am excited for the opportunity to partner with you to meet your health care goals. It was a pleasure meeting you today!  I am an Adult-Geriatric Nurse Practitioner with a background in caring for patients for more than 20 years. I received my Paediatric nurse in Nursing and my Doctor of Nursing Practice degrees at [employer]. I received additional fellowship training in primary care and sports medicine after receiving my doctorate degree. I provide primary care and sports medicine services to patients age 19 and older within this office. I am also a provider with the [facility] and the director of the APP Fellowship with [facility].  I am a [place] native, but have called the [place] area home for nearly 20 years and am proud to be a member of this community.   I am passionate about providing the best service to you through preventive medicine and supportive care. I consider you a part of the medical team and value your input. I work diligently to ensure that you are heard and your needs are met in a safe and effective  manner. I want you to feel comfortable with me as your provider and want you to know that your health concerns are important to me.   For your information, our office hours are Monday- Friday 8:00 AM - 5:00 PM At this time I am not in the office on Wednesdays.  If you have questions or concerns, please call our office at [phone] or send [place] a MyChart message and we will respond as quickly as possible.   For all urgent or time sensitive needs we ask that you please call the office to avoid delays. MyChart is not constantly monitored and replies may take up to 72 business hours.  MyChart Policy: MyChart allows for you to see your visit notes, after visit summary, provider recommendations, lab and tests results, make an appointment, request refills, and contact your provider or the office for non-urgent questions or concerns.  Providers are seeing patients during normal business hours and do not have built in time to review MyChart messages. We ask that you allow a minimum of 72 business hours for MyChart message responses.  Complex MyChart concerns may require a visit. Your provider may request you schedule a virtual or in person visit to ensure we are providing the best care possible. MyChart messages sent after 4:00 PM on Friday will not be received by the provider until Monday morning.    Lab and Test Results: You will receive your lab and test results on MyChart as soon as they are completed and results have  been sent by the lab or testing facility. Due to this service, you will receive your results BEFORE your provider.  Please allow a minimum of 72 business hours for your provider to receive and review lab and test results and contact you about.   Most lab and test result comments from the provider will be sent through [place]. Your provider may recommend changes to the plan of care, follow-up visits, repeat testing, ask questions, or request an office visit to discuss these results. You  may reply directly to this message or call the office at [phone] to provide information for the provider or set up an appointment. In some instances, you will be called with test results and recommendations. Please let us know if this is preferred and we will make note of this in your chart to provide this for you.    If you have not heard a response to your lab or test results in 72 business hours, please call the office to let us know.   After Hours: For all non-emergency after hours needs, please call the office at [phone] and select the option to reach the on-call provider service. On-call services are shared between multiple [place] offices and therefore it will not be possible to speak directly with your provider. On-call providers may provide medical advice and recommendations, but are unable to provide refills for maintenance medications.  For all emergency or urgent medical needs after normal business hours, we recommend that you seek care at the closest Urgent Care or Emergency Department to ensure appropriate treatment in a timely manner.  [facility] [place] has a 24 hour emergency room located on the ground floor for your convenience.    Please do not hesitate to reach out to [place] with concerns.   Thank you, again, for choosing me as your health care partner. I appreciate your trust and look forward to learning more about you.   Worthy Keeler, DNP, AGNP-c ___________________________________________________________  Health Maintenance Recommendations Screening Testing Mammogram Every 1 -2 years based on history and risk factors Starting at age 1 Pap Smear Ages 21-39 every 3 years Ages 9-65 every 5 years with HPV testing More frequent testing may be required based on results and history Colon Cancer Screening Every 1-10 years based on test performed, risk factors, and history Starting at age 78 Bone Density Screening Every 2-10 years based on  history Starting at age 73 for women Recommendations for men differ based on medication usage, history, and risk factors AAA Screening One time ultrasound Men 47-36 years old who have every smoked Lung Cancer Screening Low Dose Lung CT every 12 months Age 66-80 years with a 30 pack-year smoking history who still smoke or who have quit within the last 15 years  Screening Labs Routine  Labs: Complete Blood Count (CBC), Complete Metabolic Panel (CMP), Cholesterol (Lipid Panel) Every 6-12 months based on history and medications May be recommended more frequently based on current conditions or previous results Hemoglobin A1c Lab Every 3-12 months based on history and previous results Starting at age 5 or earlier with diagnosis of diabetes, high cholesterol, BMI >26, and/or risk factors Frequent monitoring for patients with diabetes to ensure blood sugar control Thyroid Panel (TSH w/ T3 & T4) Every 6 months based on history, symptoms, and risk factors May be repeated more often if on medication HIV One time testing for all patients 78 and older May be repeated more frequently for patients with increased risk factors or exposure Hepatitis  C One time testing for all patients 38 and older May be repeated more frequently for patients with increased risk factors or exposure Gonorrhea, Chlamydia Every 12 months for all sexually active persons 13-24 years Additional monitoring may be recommended for those who are considered high risk or who have symptoms PSA Men 59-27 years old with risk factors Additional screening may be recommended from age 27-69 based on risk factors, symptoms, and history  Vaccine Recommendations Tetanus Booster All adults every 10 years Flu Vaccine All patients 6 months and older every year COVID Vaccine All patients 12 years and older Initial dosing with booster May recommend additional booster based on age and health history HPV Vaccine 2 doses all patients  age 42-26 Dosing may be considered for patients over 26 Shingles Vaccine (Shingrix) 2 doses all adults 18 years and older Pneumonia (Pneumovax 23) All adults 30 years and older May recommend earlier dosing based on health history Pneumonia (Prevnar 5) All adults 57 years and older Dosed 1 year after Pneumovax 23  Additional Screening, Testing, and Vaccinations may be recommended on an individualized basis based on family history, health history, risk factors, and/or exposure.  __________________________________________________________  Diet Recommendations for All Patients  I recommend that all patients maintain a diet low in saturated fats, carbohydrates, and cholesterol. While this can be challenging at first, it is not impossible and small changes can make big differences.  Things to try: Decreasing the amount of soda, sweet tea, and/or juice to one or less per day and replace with water While water is always the first choice, if you do not like water you may consider adding a water additive without sugar to improve the taste other sugar free drinks Replace potatoes with a brightly colored vegetable at dinner Use healthy oils, such as canola oil or olive oil, instead of butter or hard margarine Limit your bread intake to two pieces or less a day Replace regular pasta with low carb pasta options Bake, broil, or grill foods instead of frying Monitor portion sizes  Eat smaller, more frequent meals throughout the day instead of large meals  An important thing to remember is, if you love foods that are not great for your health, you don't have to give them up completely. Instead, allow these foods to be a reward when you have done well. Allowing yourself to still have special treats every once in a while is a nice way to tell yourself thank you for working hard to keep yourself healthy.   Also remember that every day is a new day. If you have a bad day and "fall off the wagon", you can  still climb right back up and keep moving along on your journey!  We have resources available to help you!  Some websites that may be helpful include: www.http://carter.biz/  Www.VeryWellFit.com _____________________________________________________________  Activity Recommendations for All Patients  I recommend that all adults get at least 20 minutes of moderate physical activity that elevates your heart rate at least 5 days out of the week.  Some examples include: Walking or jogging at a pace that allows you to carry on a conversation Cycling (stationary bike or outdoors) Water aerobics Yoga Weight lifting Dancing If physical limitations prevent you from putting stress on your joints, exercise in a pool or seated in a chair are excellent options.  Do determine your MAXIMUM heart rate for activity: YOUR AGE - 220 = MAX HeartRate   Remember! Do not push yourself too hard.  Start slowly  and build up your pace, speed, weight, time in exercise, etc.  Allow your body to rest between exercise and get good sleep. You will need more water than normal when you are exerting yourself. Do not wait until you are thirsty to drink. Drink with a purpose of getting in at least 8, 8 ounce glasses of water a day plus more depending on how much you exercise and sweat.    If you begin to develop dizziness, chest pain, abdominal pain, jaw pain, shortness of breath, headache, vision changes, lightheadedness, or other concerning symptoms, stop the activity and allow your body to rest. If your symptoms are severe, seek emergency evaluation immediately. If your symptoms are concerning, but not severe, please let us know so that we can recommend further evaluation.   ________________________________________________________________

## 2021-06-01 NOTE — Assessment & Plan Note (Signed)
Will monitor labs today. See BMI 40.0-44.9 for full A&P for weight

## 2021-06-01 NOTE — Assessment & Plan Note (Signed)
Managed by endocrinology Reported labs WNL- will review medical records No indication that condition is affecting weight gain given the historical description. No changes to plan of care today Will follow

## 2021-06-01 NOTE — Assessment & Plan Note (Signed)
Unknown etiology. No significant bruising evident today Possible correlation between pre-natal vitamin intake  Will monitor labs today and make changes to plan of care as appropriate

## 2021-06-01 NOTE — Assessment & Plan Note (Addendum)
Review of current and past medical history, social history, medication, and family history.  Review of care gaps and health maintenance recommendations.  Records from recent providers to be requested if not available in Chart Review or Care Everywhere.  Recommendations for health maintenance, diet, and exercise provided.  CPE today with labs. Recommend 4th COVID booster for high BMI F/U in 1 year for CPE

## 2021-06-02 ENCOUNTER — Other Ambulatory Visit (HOSPITAL_BASED_OUTPATIENT_CLINIC_OR_DEPARTMENT_OTHER): Payer: Self-pay | Admitting: Nurse Practitioner

## 2021-06-02 ENCOUNTER — Other Ambulatory Visit (HOSPITAL_COMMUNITY): Payer: Self-pay

## 2021-06-02 DIAGNOSIS — E559 Vitamin D deficiency, unspecified: Secondary | ICD-10-CM

## 2021-06-02 LAB — COMPREHENSIVE METABOLIC PANEL
ALT: 17 IU/L (ref 0–32)
AST: 17 IU/L (ref 0–40)
Albumin/Globulin Ratio: 1.7 (ref 1.2–2.2)
Albumin: 4.9 g/dL — ABNORMAL HIGH (ref 3.8–4.8)
Alkaline Phosphatase: 117 IU/L (ref 44–121)
BUN/Creatinine Ratio: 11 (ref 9–23)
BUN: 8 mg/dL (ref 6–20)
Bilirubin Total: 0.2 mg/dL (ref 0.0–1.2)
CO2: 24 mmol/L (ref 20–29)
Calcium: 9.4 mg/dL (ref 8.7–10.2)
Chloride: 101 mmol/L (ref 96–106)
Creatinine, Ser: 0.72 mg/dL (ref 0.57–1.00)
Globulin, Total: 2.9 g/dL (ref 1.5–4.5)
Glucose: 82 mg/dL (ref 65–99)
Potassium: 4.3 mmol/L (ref 3.5–5.2)
Sodium: 139 mmol/L (ref 134–144)
Total Protein: 7.8 g/dL (ref 6.0–8.5)
eGFR: 112 mL/min/{1.73_m2} (ref >59)

## 2021-06-02 LAB — CBC WITH DIFFERENTIAL/PLATELET
Basophils Absolute: 0.1 10*3/uL (ref 0.0–0.2)
Basos: 1 %
EOS (ABSOLUTE): 0.3 10*3/uL (ref 0.0–0.4)
Eos: 3 %
Hematocrit: 45.6 % (ref 34.0–46.6)
Hemoglobin: 15.2 g/dL (ref 11.1–15.9)
Immature Grans (Abs): 0.1 10*3/uL (ref 0.0–0.1)
Immature Granulocytes: 2 %
Lymphocytes Absolute: 3 10*3/uL (ref 0.7–3.1)
Lymphs: 35 %
MCH: 30.8 pg (ref 26.6–33.0)
MCHC: 33.3 g/dL (ref 31.5–35.7)
MCV: 93 fL (ref 79–97)
Monocytes Absolute: 0.5 10*3/uL (ref 0.1–0.9)
Monocytes: 6 %
Neutrophils Absolute: 4.5 10*3/uL (ref 1.4–7.0)
Neutrophils: 53 %
Platelets: 324 10*3/uL (ref 150–450)
RBC: 4.93 x10E6/uL (ref 3.77–5.28)
RDW: 12.6 % (ref 11.7–15.4)
WBC: 8.5 10*3/uL (ref 3.4–10.8)

## 2021-06-02 LAB — VITAMIN D 25 HYDROXY (VIT D DEFICIENCY, FRACTURES): Vit D, 25-Hydroxy: 12.8 ng/mL — ABNORMAL LOW (ref 30.0–100.0)

## 2021-06-02 MED ORDER — VITAMIN D (ERGOCALCIFEROL) 1.25 MG (50000 UNIT) PO CAPS
50000.0000 [IU] | ORAL_CAPSULE | ORAL | 0 refills | Status: AC
  Filled 2021-06-02: qty 8, 56d supply, fill #0

## 2021-06-02 MED FILL — Levothyroxine Sodium Tab 125 MCG: ORAL | 90 days supply | Qty: 90 | Fill #0 | Status: AC

## 2021-06-02 NOTE — Progress Notes (Signed)
Please call patient:  Vitamin D levels are very low. Recommend once weekly Vitamin D replacement for 8 weeks then transition to daily replacement. Labs otherwise look good.   I will send the vitamin d to the pharmacy. After completion of the prescription- over the counter Vitamin D3 800iU per day with Calcium 1000mg  per day recommended.   Will recheck labs in 3 months.

## 2021-06-04 ENCOUNTER — Telehealth (HOSPITAL_BASED_OUTPATIENT_CLINIC_OR_DEPARTMENT_OTHER): Payer: Self-pay

## 2021-06-04 NOTE — Telephone Encounter (Signed)
-----   Message from Tollie Eth, NP sent at 06/02/2021  9:42 AM EDT ----- Please call patient:  Vitamin D levels are very low. Recommend once weekly Vitamin D replacement for 8 weeks then transition to daily replacement. Labs otherwise look good.   I will send the vitamin d to the pharmacy. After completion of the prescription- over the counter Vitamin D3 800iU per day with Calcium 1000mg  per day recommended.   Will recheck labs in 3 months.

## 2021-06-04 NOTE — Telephone Encounter (Signed)
Results reviewed by patient via MyChart.  Called patient to make sure she did not have a questions.  Instructed patient to contact the office with any questions or concerns.

## 2021-06-10 ENCOUNTER — Encounter (HOSPITAL_BASED_OUTPATIENT_CLINIC_OR_DEPARTMENT_OTHER): Payer: Self-pay | Admitting: Nurse Practitioner

## 2021-06-12 ENCOUNTER — Other Ambulatory Visit (HOSPITAL_BASED_OUTPATIENT_CLINIC_OR_DEPARTMENT_OTHER): Payer: Self-pay | Admitting: Nurse Practitioner

## 2021-06-12 DIAGNOSIS — I10 Essential (primary) hypertension: Secondary | ICD-10-CM

## 2021-06-12 MED ORDER — HYDROCHLOROTHIAZIDE 25 MG PO TABS
25.0000 mg | ORAL_TABLET | Freq: Every day | ORAL | 3 refills | Status: DC
  Filled 2021-06-12 – 2021-07-24 (×2): qty 30, 30d supply, fill #0
  Filled 2021-08-20: qty 30, 30d supply, fill #1
  Filled 2021-09-22: qty 30, 30d supply, fill #2
  Filled 2021-10-23: qty 30, 30d supply, fill #3

## 2021-06-13 ENCOUNTER — Other Ambulatory Visit (HOSPITAL_COMMUNITY): Payer: Self-pay

## 2021-06-22 DIAGNOSIS — E890 Postprocedural hypothyroidism: Secondary | ICD-10-CM | POA: Diagnosis not present

## 2021-06-22 DIAGNOSIS — E559 Vitamin D deficiency, unspecified: Secondary | ICD-10-CM | POA: Diagnosis not present

## 2021-06-25 DIAGNOSIS — E890 Postprocedural hypothyroidism: Secondary | ICD-10-CM | POA: Diagnosis not present

## 2021-06-25 DIAGNOSIS — I10 Essential (primary) hypertension: Secondary | ICD-10-CM | POA: Diagnosis not present

## 2021-06-25 DIAGNOSIS — E559 Vitamin D deficiency, unspecified: Secondary | ICD-10-CM | POA: Diagnosis not present

## 2021-07-03 ENCOUNTER — Other Ambulatory Visit (HOSPITAL_COMMUNITY): Payer: Self-pay

## 2021-07-24 ENCOUNTER — Other Ambulatory Visit (HOSPITAL_COMMUNITY): Payer: Self-pay

## 2021-07-30 ENCOUNTER — Other Ambulatory Visit (HOSPITAL_COMMUNITY): Payer: Self-pay

## 2021-07-30 ENCOUNTER — Encounter (INDEPENDENT_AMBULATORY_CARE_PROVIDER_SITE_OTHER): Payer: Self-pay

## 2021-08-20 ENCOUNTER — Other Ambulatory Visit (HOSPITAL_COMMUNITY): Payer: Self-pay

## 2021-08-21 ENCOUNTER — Other Ambulatory Visit (HOSPITAL_COMMUNITY): Payer: Self-pay

## 2021-08-31 ENCOUNTER — Other Ambulatory Visit (HOSPITAL_COMMUNITY): Payer: Self-pay

## 2021-08-31 MED FILL — Levothyroxine Sodium Tab 125 MCG: ORAL | 90 days supply | Qty: 90 | Fill #1 | Status: AC

## 2021-09-01 ENCOUNTER — Encounter (HOSPITAL_BASED_OUTPATIENT_CLINIC_OR_DEPARTMENT_OTHER): Payer: Self-pay | Admitting: Nurse Practitioner

## 2021-09-01 ENCOUNTER — Telehealth (INDEPENDENT_AMBULATORY_CARE_PROVIDER_SITE_OTHER): Payer: 59 | Admitting: Nurse Practitioner

## 2021-09-01 ENCOUNTER — Other Ambulatory Visit (HOSPITAL_COMMUNITY): Payer: Self-pay

## 2021-09-01 ENCOUNTER — Encounter (HOSPITAL_BASED_OUTPATIENT_CLINIC_OR_DEPARTMENT_OTHER): Payer: Self-pay

## 2021-09-01 VITALS — BP 133/78 | HR 75 | Ht 65.0 in | Wt 239.5 lb

## 2021-09-01 DIAGNOSIS — I10 Essential (primary) hypertension: Secondary | ICD-10-CM

## 2021-09-01 DIAGNOSIS — Z6841 Body Mass Index (BMI) 40.0 and over, adult: Secondary | ICD-10-CM

## 2021-09-01 DIAGNOSIS — E559 Vitamin D deficiency, unspecified: Secondary | ICD-10-CM | POA: Diagnosis not present

## 2021-09-01 DIAGNOSIS — R635 Abnormal weight gain: Secondary | ICD-10-CM | POA: Diagnosis not present

## 2021-09-01 MED ORDER — SEMAGLUTIDE (2 MG/DOSE) 8 MG/3ML ~~LOC~~ SOPN
2.0000 mg | PEN_INJECTOR | SUBCUTANEOUS | 2 refills | Status: DC
  Filled 2021-09-01: qty 6, 56d supply, fill #0
  Filled 2021-10-11: qty 3, 28d supply, fill #0
  Filled 2021-11-16: qty 3, 28d supply, fill #1

## 2021-09-01 MED ORDER — VITAMIN D (ERGOCALCIFEROL) 1.25 MG (50000 UNIT) PO CAPS
50000.0000 [IU] | ORAL_CAPSULE | ORAL | 0 refills | Status: DC
  Filled 2021-09-01: qty 8, 56d supply, fill #0

## 2021-09-01 MED ORDER — SEMAGLUTIDE (1 MG/DOSE) 4 MG/3ML ~~LOC~~ SOPN
1.0000 mg | PEN_INJECTOR | SUBCUTANEOUS | 0 refills | Status: DC
  Filled 2021-09-01: qty 3, 28d supply, fill #0

## 2021-09-01 NOTE — Progress Notes (Addendum)
Virtual Visit Encounter     Virtual Visit via Video Note   This visit type was conducted due to national recommendations for restrictions regarding the COVID-19 Pandemic (e.g. social distancing) in an effort to limit this patient's exposure and mitigate transmission in our community.  Due to her co-morbid illnesses, this patient is at least at moderate risk for complications without adequate follow up.  This format is felt to be most appropriate for this patient at this time.  All issues noted in this document were discussed and addressed.  A limited physical exam was performed with this format.  Please refer to the patient's chart for her consent to telehealth for [facility].     I connected with  Donna Tucker on 09/01/21 at 10:10 AM EDT by secure audio and/or video enabled telemedicine application. I verified that I am speaking with the correct person using two identifiers.   I introduced myself as a Publishing rights manager with the practice. We discussed the limitations of evaluation and management by telemedicine and the availability of in person appointments. The patient expressed understanding and agreed to proceed.  Participating parties in this visit include: Myself and patient  The patient is: Patient Location: Home I am: Provider Location: Office/Clinic Subjective:    CC and HPI: Donna Tucker is a 35 y.o. year old female presenting for follow up of weight management.  She has lost about 5 lbs since starting, but has lost about 2 inches on her major measurements.  She feels like her appetite is decrease. No symptoms of hypoglycemia.  She has been eating smaller portions and eating healthy options.  She has been walking routinely but has not started any weight training activities due to time constraints.  She is tolerating Ozempic well and denies and side effects other than intermittent nausea right after the dose.  She is currently on the 0.5mg  dose.   She recently had her  Vitamin D labs drawn with endo and this was still low at 22.9. She is currently taking a daily supplement of vitamin d and calcium OTC.  Her BP is running 115-130 on average. No CP, ShOB, palpitations, HA, chest pain.  She has had increased urination with the medication.  She doesn't necessarily feel that this is well controlled.   Past medical history, Surgical history, Family history not pertinant except as noted below, Social history, Allergies, and medications have been entered into the medical record, reviewed, and corrections made.    Review of Systems:  All review of systems negative except what is listed in the HPI  Objective:    Alert and oriented x 4 Speaking in clear sentences with no shortness of breath. No distress.  Impression and Recommendations:    Problem List Items Addressed This Visit     Vitamin D deficiency    Plan to repeat 8 week course of Vitamin D3 at 50,000 u per week due to continued low levels at 22.9. Will recheck labs in 8 weeks.       Relevant Medications   Vitamin D, Ergocalciferol, (DRISDOL) 1.25 MG (50000 UNIT) CAPS capsule   Hypertension    BP fairly well controlled, but is still having some highs.  I would like to see how she responds to the increased dose of ozempic and her weight loss goals before making any additional changes.       Weight gain - Primary   Relevant Medications   Semaglutide, 1 MG/DOSE, 4 MG/3ML SOPN   Semaglutide, 2  MG/DOSE, 8 MG/3ML SOPN   BMI 40.0-44.9, adult (HCC)   Relevant Medications   Semaglutide, 1 MG/DOSE, 4 MG/3ML SOPN   Semaglutide, 2 MG/DOSE, 8 MG/3ML SOPN    orders and follow up as documented in EMR I discussed the assessment and treatment plan with the patient. The patient was provided an opportunity to ask questions and all were answered. The patient agreed with the plan and demonstrated an understanding of the instructions.   The patient was advised to call back or seek an in-person evaluation if  the symptoms worsen or if the condition fails to improve as anticipated.  Follow-Up: in 3 months  I provided 21 minutes of non-face-to-face interaction with this non face-to-face video including intake, same-day documentation, and chart review.   Tollie Eth, NP , DNP, AGNP-c [facility] Primary Care & Sports Medicine at [facility] [phone] (fax)

## 2021-09-01 NOTE — Patient Instructions (Addendum)
We will continue with the Ozempic and plan to increase to the 1mg  dose at your next refill. You will continue this for 4 weeks then increase to the 2mg  dose.   Let me know if the dose change causes any changes.

## 2021-09-01 NOTE — Assessment & Plan Note (Signed)
BP fairly well controlled, but is still having some highs.  I would like to see how she responds to the increased dose of ozempic and her weight loss goals before making any additional changes.

## 2021-09-01 NOTE — Assessment & Plan Note (Signed)
Plan to repeat 8 week course of Vitamin D3 at 50,000 u per week due to continued low levels at 22.9. Will recheck labs in 8 weeks.

## 2021-09-09 ENCOUNTER — Encounter (HOSPITAL_BASED_OUTPATIENT_CLINIC_OR_DEPARTMENT_OTHER): Payer: Self-pay | Admitting: Nurse Practitioner

## 2021-09-23 ENCOUNTER — Other Ambulatory Visit (HOSPITAL_COMMUNITY): Payer: Self-pay

## 2021-09-30 ENCOUNTER — Ambulatory Visit: Payer: 59 | Attending: Internal Medicine

## 2021-09-30 DIAGNOSIS — Z23 Encounter for immunization: Secondary | ICD-10-CM

## 2021-09-30 NOTE — Progress Notes (Signed)
   Covid-19 Vaccination Clinic  Name:  Donna Tucker    MRN: [id number] DOB: 1986-05-31  09/30/2021  Donna Tucker was observed post Covid-19 immunization for 15 minutes without incident. She was provided with Vaccine Information Sheet and instruction to access the V-Safe system.   Donna Tucker was instructed to call 911 with any severe reactions post vaccine: Difficulty breathing  Swelling of face and throat  A fast heartbeat  A bad rash all over body  Dizziness and weakness

## 2021-10-02 ENCOUNTER — Other Ambulatory Visit (HOSPITAL_COMMUNITY): Payer: Self-pay

## 2021-10-12 ENCOUNTER — Other Ambulatory Visit (HOSPITAL_COMMUNITY): Payer: Self-pay

## 2021-10-23 ENCOUNTER — Other Ambulatory Visit (HOSPITAL_COMMUNITY): Payer: Self-pay

## 2021-10-27 ENCOUNTER — Other Ambulatory Visit (HOSPITAL_BASED_OUTPATIENT_CLINIC_OR_DEPARTMENT_OTHER): Payer: Self-pay

## 2021-10-27 MED ORDER — PFIZER COVID-19 VAC BIVALENT 30 MCG/0.3ML IM SUSP
INTRAMUSCULAR | 0 refills | Status: DC
  Filled 2021-10-27: qty 0.3, 1d supply, fill #0

## 2021-10-31 ENCOUNTER — Telehealth: Payer: 59 | Admitting: Nurse Practitioner

## 2021-10-31 DIAGNOSIS — J0100 Acute maxillary sinusitis, unspecified: Secondary | ICD-10-CM

## 2021-10-31 MED ORDER — AMOXICILLIN-POT CLAVULANATE 875-125 MG PO TABS: 1.0000 | ORAL_TABLET | Freq: Two times a day (BID) | ORAL | 0 refills | Status: DC

## 2021-10-31 NOTE — Progress Notes (Signed)

## 2021-11-16 ENCOUNTER — Other Ambulatory Visit (HOSPITAL_COMMUNITY): Payer: Self-pay

## 2021-11-16 ENCOUNTER — Other Ambulatory Visit (HOSPITAL_BASED_OUTPATIENT_CLINIC_OR_DEPARTMENT_OTHER): Payer: Self-pay | Admitting: Nurse Practitioner

## 2021-11-16 DIAGNOSIS — I10 Essential (primary) hypertension: Secondary | ICD-10-CM

## 2021-11-17 ENCOUNTER — Other Ambulatory Visit (HOSPITAL_COMMUNITY): Payer: Self-pay

## 2021-11-17 MED ORDER — HYDROCHLOROTHIAZIDE 25 MG PO TABS
25.0000 mg | ORAL_TABLET | Freq: Every day | ORAL | 0 refills | Status: DC
  Filled 2021-11-17: qty 30, 30d supply, fill #0

## 2021-11-27 ENCOUNTER — Other Ambulatory Visit (HOSPITAL_COMMUNITY): Payer: Self-pay

## 2021-11-27 MED FILL — Levothyroxine Sodium Tab 125 MCG: ORAL | 90 days supply | Qty: 90 | Fill #2 | Status: AC

## 2021-12-14 ENCOUNTER — Telehealth: Payer: 59 | Admitting: Emergency Medicine

## 2021-12-14 ENCOUNTER — Emergency Department: Admit: 2021-12-14 | Discharge status: Absent without leave | Payer: Self-pay

## 2021-12-14 DIAGNOSIS — J029 Acute pharyngitis, unspecified: Secondary | ICD-10-CM

## 2021-12-14 MED ORDER — AMOXICILLIN 500 MG PO CAPS: 500.0000 mg | ORAL_CAPSULE | Freq: Two times a day (BID) | ORAL | 0 refills | Status: AC

## 2021-12-14 NOTE — Progress Notes (Signed)
E-Visit for Sore Throat - Strep Symptoms ? ?We are sorry that you are not feeling well.  Here is how we plan to help! ? ?Based on what you have shared with me it is likely that you have strep pharyngitis.  Strep pharyngitis is inflammation and infection in the back of the throat.  This is an infection cause by bacteria and is treated with antibiotics.  I have prescribed Amoxicillin 500 mg twice a day for 10 days. For throat pain, we recommend over the counter oral pain relief medications such as acetaminophen or aspirin, or anti-inflammatory medications such as ibuprofen or naproxen sodium. Topical treatments such as oral throat lozenges or sprays may be used as needed. Strep infections are not as easily transmitted as other respiratory infections, however we still recommend that you avoid close contact with loved ones, especially the very young and elderly.  Remember to wash your hands thoroughly throughout the day as this is the number one way to prevent the spread of infection and wipe down door knobs and counters with disinfectant. ? ? ?Home Care: ?Only take medications as instructed by your medical team. ?Complete the entire course of an antibiotic. ?Do not take these medications with alcohol. ?A steam or ultrasonic humidifier can help congestion.  You can place a towel over your head and breathe in the steam from hot water coming from a faucet. ?Avoid close contacts especially the very young and the elderly. ?Cover your mouth when you cough or sneeze. ?Always remember to wash your hands. ? ?Get Help Right Away If: ?You develop worsening fever or sinus pain. ?You develop a severe head ache or visual changes. ?Your symptoms persist after you have completed your treatment plan. ? ?Make sure you ?Understand these instructions. ?Will watch your condition. ?Will get help right away if you are not doing well or get worse. ? ? ?Thank you for choosing an e-visit. ? ?Your e-visit answers were reviewed by a board  certified advanced clinical practitioner to complete your personal care plan. Depending upon the condition, your plan could have included both over the counter or prescription medications. ? ?Please review your pharmacy choice. Make sure the pharmacy is open so you can pick up prescription now. If there is a problem, you may contact your provider through MyChart messaging and have the prescription routed to another pharmacy.  Your safety is important to us. If you have drug allergies check your prescription carefully.  ? ?For the next 24 hours you can use MyChart to ask questions about today's visit, request a non-urgent call back, or ask for a work or school excuse. ?You will get an email in the next two days asking about your experience. I hope that your e-visit has been valuable and will speed your recovery. ? ?I have spent 5 minutes in review of e-visit questionnaire, review and updating patient chart, medical decision making and response to patient.  ? ?March Steyer, PhD, FNP-BC ?  ?

## 2021-12-21 ENCOUNTER — Other Ambulatory Visit (HOSPITAL_BASED_OUTPATIENT_CLINIC_OR_DEPARTMENT_OTHER): Payer: Self-pay | Admitting: Nurse Practitioner

## 2021-12-21 ENCOUNTER — Other Ambulatory Visit (HOSPITAL_COMMUNITY): Payer: Self-pay

## 2021-12-21 DIAGNOSIS — I10 Essential (primary) hypertension: Secondary | ICD-10-CM

## 2021-12-21 MED ORDER — HYDROCHLOROTHIAZIDE 25 MG PO TABS
25.0000 mg | ORAL_TABLET | Freq: Every day | ORAL | 0 refills | Status: DC
  Filled 2021-12-21: qty 30, 30d supply, fill #0

## 2021-12-31 ENCOUNTER — Encounter (HOSPITAL_BASED_OUTPATIENT_CLINIC_OR_DEPARTMENT_OTHER): Payer: Self-pay | Admitting: Nurse Practitioner

## 2021-12-31 ENCOUNTER — Other Ambulatory Visit (HOSPITAL_COMMUNITY): Payer: Self-pay

## 2021-12-31 ENCOUNTER — Telehealth (INDEPENDENT_AMBULATORY_CARE_PROVIDER_SITE_OTHER): Payer: 59 | Admitting: Nurse Practitioner

## 2021-12-31 VITALS — BP 130/80 | Wt 230.0 lb

## 2021-12-31 DIAGNOSIS — F419 Anxiety disorder, unspecified: Secondary | ICD-10-CM | POA: Diagnosis not present

## 2021-12-31 DIAGNOSIS — Z6841 Body Mass Index (BMI) 40.0 and over, adult: Secondary | ICD-10-CM

## 2021-12-31 DIAGNOSIS — R635 Abnormal weight gain: Secondary | ICD-10-CM | POA: Diagnosis not present

## 2021-12-31 DIAGNOSIS — I10 Essential (primary) hypertension: Secondary | ICD-10-CM

## 2021-12-31 MED ORDER — SERTRALINE HCL 25 MG PO TABS
ORAL_TABLET | Freq: Every day | ORAL | 3 refills | Status: DC
  Filled 2021-12-31: qty 90, 90d supply, fill #0
  Filled 2022-04-01: qty 90, 90d supply, fill #1
  Filled 2022-07-02: qty 90, 90d supply, fill #2
  Filled 2022-09-28: qty 90, 90d supply, fill #3

## 2021-12-31 MED ORDER — PHENTERMINE HCL 37.5 MG PO CAPS
ORAL_CAPSULE | ORAL | 3 refills | Status: DC
  Filled 2021-12-31: qty 30, 30d supply, fill #0
  Filled 2022-01-31: qty 30, 30d supply, fill #1
  Filled 2022-03-01: qty 30, 30d supply, fill #2
  Filled 2022-03-31: qty 30, 30d supply, fill #3

## 2021-12-31 NOTE — Patient Instructions (Signed)
We will plan to try phentermine along with diet and activity levels.  Your maximum heart rate during activity should be about 185 bpm. You will want to have your heart rate in the 120s to 130s range for optimal fat burning.  I do recommend keeping your heart rate in this target range approximately 15 to 20 minutes with activity for sustained fat burning. I recommend a goal of activity at least 30 minutes a session 5 days a week or 45 minutes a session 3 days a week.  Continue to monitor your diet.  It looks like you are doing a very good job with what you have done so far with a 5 pound weight loss!  Keep up the good work.  Please let me know if you have any questions or concerns once you start the new medication.  Also please let me know if it seems like your blood pressure is bumping up.  We will plan to follow-up in about 3 to 6 months to see how you are doing.

## 2021-12-31 NOTE — Assessment & Plan Note (Signed)
Well controlled with consistent levels in low 130's/80's. Recommend continue medication and continue monitoring for elevations in BP Exercise and dietary recommendations discussed. Work to get HR to target level and maintain for at least 20 minutes per session- target HR about 130 BPM for fat burning. Max HR 185. Continue with medication and monitoring diet.  Will f/u in 3-6 months or sooner if needed

## 2021-12-31 NOTE — Assessment & Plan Note (Addendum)
Unfortunately patient was unable to have successful weight loss with Ozempic on maximum dose. Discussed alternative options with patient including recommendations for diet and exercise.  We will also add phentermine which has been successful for her in the past to see if we can get a boost in her metabolism to help kick start her weight loss efforts. Her blood pressures are well controlled at this time with no concerning symptoms. Discussed with patient if she begins to notice any concerning side effects to please reach out to the office and let me know.  Otherwise we will plan to follow-up in 3 to 6 months and monitor progression. As long as she is tolerating the phentermine well okay to send refill for 3 additional months once she has completed the first 3 months of treatment.

## 2021-12-31 NOTE — Progress Notes (Signed)
Virtual Visit Encounter telephone visit.   I connected with  Donna Tucker on 12/31/21 at  8:10 AM EST by secure audio and/or video enabled telemedicine application. I verified that I am speaking with the correct person using two identifiers.   I introduced myself as a Publishing rights manager with the practice. The limitations of evaluation and management by telemedicine discussed with the patient and the availability of in person appointments. The patient expressed verbal understanding and consent to proceed.  Participating parties in this visit include: Myself and patient  The patient is: Patient Location: Home I am: Provider Location: Office/Clinic Subjective:    CC and HPI: Donna Tucker is a 36 y.o. year old female presenting for follow-up of weight and blood pressure.  She has been on Ozempic for weight and CV benefits since June 2022. She had tapered up to the 2mg  dose and reports a total weight loss of only about 5 pounds. She reports she feels the medication just did not work well with her symptoms. She had some mild side effects of nausea on the 2mg  dose, but no other concerning side effects and no particular control of her appetite or weight. She decided to stop the medication about 4 weeks ago after missing a dose while ill and has lost about 5 pounds on her own since that time with strict diet management. She does have some changes in her life that will allow [facility] and is hoping that this will help with her efforts. She has used phentermine in the distant past for weight loss and this was very effective for her to loose about 40 pounds. She would be interested in trying this again.   Her blood pressures have been consistent and stable in the low 130's/80's. She has no HA, dizziness, palpitations, CP. She is taking her medication as prescribed. No reportable side effects.   Past medical history, Surgical history, Family history  not pertinant except as noted below, Social history, Allergies, and medications have been entered into the medical record, reviewed, and corrections made.   Review of Systems:  All review of systems negative except what is listed in the HPI  Objective:    Alert and oriented x 4 Speaking in clear sentences with no shortness of breath. No distress.  Impression and Recommendations:    Problem List Items Addressed This Visit     Anxiety    Sertraline refill today. No alarm symptoms. Doing well on current dose.       Relevant Medications   sertraline (ZOLOFT) 25 MG tablet   Hypertension    Well controlled with consistent levels in low 130's/80's. Recommend continue medication and continue monitoring for elevations in BP Exercise and dietary recommendations discussed. Work to get HR to target level and maintain for at least 20 minutes per session- target HR about 130 BPM for fat burning. Max HR 185. Continue with medication and monitoring diet.  Will f/u in 3-6 months or sooner if needed      Weight gain    Unfortunately patient was unable to have successful weight loss with Ozempic on maximum dose. Discussed alternative options with patient including recommendations for diet and exercise.  We will also add phentermine which has been successful for her in the past to see if we can get a boost in her metabolism to help kick start her weight loss efforts. Her blood pressures are well controlled at  this time with no concerning symptoms. Discussed with patient if she begins to notice any concerning side effects to please reach out to the office and let me know.  Otherwise we will plan to follow-up in 3 to 6 months and monitor progression.      Relevant Medications   phentermine 37.5 MG capsule   BMI 40.0-44.9, adult (HCC) - Primary   Relevant Medications   phentermine 37.5 MG capsule    orders and follow up as documented in EMR I discussed the assessment and treatment plan with the  patient. The patient was provided an opportunity to ask questions and all were answered. The patient agreed with the plan and demonstrated an understanding of the instructions.   The patient was advised to call back or seek an in-person evaluation if the symptoms worsen or if the condition fails to improve as anticipated.  Follow-Up: 3-6 months BP and Weight  I provided 20 minutes of non-face-to-face interaction with this non face-to-face encounter including intake, same-day documentation, and chart review.   Tollie Eth, NP , DNP, AGNP-c [facility] at [facility] [phone] (fax)

## 2021-12-31 NOTE — Assessment & Plan Note (Signed)
Sertraline refill today. No alarm symptoms. Doing well on current dose.

## 2022-01-22 ENCOUNTER — Other Ambulatory Visit (HOSPITAL_COMMUNITY): Payer: Self-pay

## 2022-01-22 ENCOUNTER — Other Ambulatory Visit (HOSPITAL_BASED_OUTPATIENT_CLINIC_OR_DEPARTMENT_OTHER): Payer: Self-pay | Admitting: Nurse Practitioner

## 2022-01-22 DIAGNOSIS — I10 Essential (primary) hypertension: Secondary | ICD-10-CM

## 2022-01-22 MED ORDER — HYDROCHLOROTHIAZIDE 25 MG PO TABS
25.0000 mg | ORAL_TABLET | Freq: Every day | ORAL | 5 refills | Status: DC
  Filled 2022-01-22: qty 30, 30d supply, fill #0
  Filled 2022-02-24: qty 30, 30d supply, fill #1
  Filled 2022-03-26: qty 30, 30d supply, fill #2
  Filled 2022-04-27: qty 30, 30d supply, fill #3
  Filled 2022-05-27: qty 30, 30d supply, fill #4
  Filled 2022-06-23: qty 30, 30d supply, fill #5

## 2022-02-01 ENCOUNTER — Other Ambulatory Visit (HOSPITAL_COMMUNITY): Payer: Self-pay

## 2022-02-24 ENCOUNTER — Other Ambulatory Visit (HOSPITAL_BASED_OUTPATIENT_CLINIC_OR_DEPARTMENT_OTHER): Payer: Self-pay

## 2022-02-25 ENCOUNTER — Other Ambulatory Visit (HOSPITAL_COMMUNITY): Payer: Self-pay

## 2022-02-25 MED ORDER — LEVOTHYROXINE SODIUM 125 MCG PO TABS
ORAL_TABLET | ORAL | 4 refills | Status: DC
  Filled 2022-02-25: qty 90, 90d supply, fill #0
  Filled 2022-05-27: qty 90, 90d supply, fill #1
  Filled 2022-09-06: qty 90, 90d supply, fill #2
  Filled 2022-12-02: qty 90, 90d supply, fill #3

## 2022-03-01 ENCOUNTER — Telehealth: Payer: 59 | Admitting: Emergency Medicine

## 2022-03-01 ENCOUNTER — Other Ambulatory Visit (HOSPITAL_COMMUNITY): Payer: Self-pay

## 2022-03-01 DIAGNOSIS — J019 Acute sinusitis, unspecified: Secondary | ICD-10-CM

## 2022-03-01 DIAGNOSIS — B9689 Other specified bacterial agents as the cause of diseases classified elsewhere: Secondary | ICD-10-CM

## 2022-03-01 MED ORDER — AMOXICILLIN-POT CLAVULANATE 875-125 MG PO TABS
1.0000 | ORAL_TABLET | Freq: Two times a day (BID) | ORAL | 0 refills | Status: DC
  Filled 2022-03-01: qty 14, 7d supply, fill #0

## 2022-03-01 NOTE — Progress Notes (Signed)
E-Visit for Sinus Problems ? ?We are sorry that you are not feeling well.  Here is how we plan to help! ? ?Based on what you have shared with me it looks like you have sinusitis.  Sinusitis is inflammation and infection in the sinus cavities of the head.  Based on your presentation I believe you most likely have Acute Bacterial Sinusitis.  This is an infection caused by bacteria and is treated with antibiotics. I have prescribed Augmentin 875mg /125mg  one tablet twice daily with food, for 7 days. You may use an oral decongestant such as Mucinex D or if you have glaucoma or high blood pressure use plain Mucinex. Saline nasal spray help and can safely be used as often as needed for congestion.  If you develop worsening sinus pain, fever or notice severe headache and vision changes, or if symptoms are not better after completion of antibiotic, please schedule an appointment with a health care provider.   ? ?I recommend you keep using Rhinocort and saline spray. Mucinex can also be very helpful for thinning out the mucus and helping it drain better.  ? ?Sinus infections are not as easily transmitted as other respiratory infection, however we still recommend that you avoid close contact with loved ones, especially the very young and elderly.  Remember to wash your hands thoroughly throughout the day as this is the number one way to prevent the spread of infection! ? ?Home Care: ?Only take medications as instructed by your medical team. ?Complete the entire course of an antibiotic. ?Do not take these medications with alcohol. ?A steam or ultrasonic humidifier can help congestion.  You can place a towel over your head and breathe in the steam from hot water coming from a faucet. ?Avoid close contacts especially the very young and the elderly. ?Cover your mouth when you cough or sneeze. ?Always remember to wash your hands. ? ?Get Help Right Away If: ?You develop worsening fever or sinus pain. ?You develop a severe head ache  or visual changes. ?Your symptoms persist after you have completed your treatment plan. ? ?Make sure you ?Understand these instructions. ?Will watch your condition. ?Will get help right away if you are not doing well or get worse. ? ?Thank you for choosing an e-visit. ? ?Your e-visit answers were reviewed by a board certified advanced clinical practitioner to complete your personal care plan. Depending upon the condition, your plan could have included both over the counter or prescription medications. ? ?Please review your pharmacy choice. Make sure the pharmacy is open so you can pick up prescription now. If there is a problem, you may contact your provider through and have the prescription routed to another pharmacy.  Your safety is important to [employer]. If you have drug allergies check your prescription carefully.  ? ?For the next 24 hours you can use MyChart to ask questions about today's visit, request a non-urgent call back, or ask for a work or school excuse. ?You will get an email in the next two days asking about your experience. I hope that your e-visit has been valuable and will speed your recovery.  ? ?I have spent 5 minutes in review of e-visit questionnaire, review and updating patient chart, medical decision making and response to patient.  ? ?[place], PhD, FNP-BC ?  ?

## 2022-03-03 ENCOUNTER — Other Ambulatory Visit (HOSPITAL_COMMUNITY): Payer: Self-pay

## 2022-03-04 ENCOUNTER — Other Ambulatory Visit: Payer: Self-pay

## 2022-03-04 ENCOUNTER — Ambulatory Visit (INDEPENDENT_AMBULATORY_CARE_PROVIDER_SITE_OTHER): Payer: 59 | Admitting: Nurse Practitioner

## 2022-03-04 ENCOUNTER — Other Ambulatory Visit (HOSPITAL_COMMUNITY): Payer: Self-pay

## 2022-03-04 ENCOUNTER — Encounter (HOSPITAL_BASED_OUTPATIENT_CLINIC_OR_DEPARTMENT_OTHER): Payer: Self-pay | Admitting: Nurse Practitioner

## 2022-03-04 VITALS — BP 128/82 | HR 97 | Temp 99.0°F | Ht 65.0 in | Wt 239.8 lb

## 2022-03-04 DIAGNOSIS — M79674 Pain in right toe(s): Secondary | ICD-10-CM | POA: Diagnosis not present

## 2022-03-04 DIAGNOSIS — B9689 Other specified bacterial agents as the cause of diseases classified elsewhere: Secondary | ICD-10-CM | POA: Diagnosis not present

## 2022-03-04 DIAGNOSIS — J019 Acute sinusitis, unspecified: Secondary | ICD-10-CM | POA: Diagnosis not present

## 2022-03-04 DIAGNOSIS — M7989 Other specified soft tissue disorders: Secondary | ICD-10-CM | POA: Diagnosis not present

## 2022-03-04 MED ORDER — DOXYCYCLINE HYCLATE 100 MG PO TABS
100.0000 mg | ORAL_TABLET | Freq: Two times a day (BID) | ORAL | 0 refills | Status: DC
  Filled 2022-03-04: qty 14, 7d supply, fill #0

## 2022-03-04 MED ORDER — PREDNISONE 50 MG PO TABS
50.0000 mg | ORAL_TABLET | Freq: Every day | ORAL | 0 refills | Status: DC
  Filled 2022-03-04: qty 5, 5d supply, fill #0

## 2022-03-04 NOTE — Progress Notes (Signed)
? ?Acute Office Visit ? ?Subjective:  ? ? Patient ID: Donna Tucker, female    DOB: 1986-04-11, 36 y.o.   MRN: [id number] ? ?Chief Complaint  ?Patient presents with  ? Foot Swelling  ? ? ?HPI ?Patient is in today for concerns with swelling in the second and third toe of the right foot that has been present for a few weeks.  ?She denies any known injury to the toes, but tells me they feel tender to the touch. She is able to move the toes and has sensation in the entire foot with no burning, numbness, or tingling. She endorses the toes appear more red than usual, but nothing severe.  ?She is not sure if they are more warm than usual. She is able to wear shoes without problem, but does endorse tenderness when her shoe or sock rubs on the toe. She assumed this would improve, but it hasn't. She is not sure if it is worsening. No fever, chills, body aches. No recent prolonged travel or history of blood clots.  ? ?She has also been fighting sinus pain and pressure for the past 5 days with increasing intensity. She has been using OTC medication without relief. She endorses pressure and tenderness in the maxillary and frontal sinuses with rhinorrhea and congestion. No fevers, chills, BA, ShOB, cough.  ? ? ?Outpatient Medications Prior to Visit  ?Medication Sig Dispense Refill  ? amoxicillin-clavulanate (AUGMENTIN) 875-125 MG tablet Take 1 tablet by mouth 2 (two) times daily. 14 tablet 0  ? Calcium Carbonate (CALCIUM 500 PO) Take 1 tablet by mouth at bedtime.    ? hydrochlorothiazide (HYDRODIURIL) 25 MG tablet Take 1 tablet  by mouth daily. 30 tablet 5  ? levothyroxine (SYNTHROID) 125 MCG tablet Take 1 tablet by mouth in the morning on an empty stomach. 90 tablet 4  ? phentermine 37.5 MG capsule Take 1 capsule by mouth every morning 30 capsule 3  ? sertraline (ZOLOFT) 25 MG tablet TAKE 1 TABLET BY MOUTH DAILY 90 tablet 3  ? VITAMIN D, CHOLECALCIFEROL, PO Take 1 tablet by mouth at bedtime.    ? Vitamin D, Ergocalciferol,  (DRISDOL) 1.25 MG (50000 UNIT) CAPS capsule Take 1 capsule by mouth every 7 (seven) days. Take for 8 total doses(weeks) 8 capsule 0  ? levothyroxine (SYNTHROID) 125 MCG tablet TAKE 1 TABLET BY MOUTH IN THE MORNING ON AN EMPTY STOMACH 90 tablet 4  ? COVID-19 mRNA bivalent vaccine, Pfizer, (PFIZER COVID-19 VAC BIVALENT) injection Inject into the muscle. 0.3 mL 0  ? ondansetron (ZOFRAN) 8 MG tablet Take 1 tablet (8 mg total) by mouth every 8 (eight) hours as needed for nausea or vomiting. (Patient not taking: Reported on 09/01/2021) 30 tablet 2  ? ?No facility-administered medications prior to visit.  ? ? ?Allergies  ?Allergen Reactions  ? Nsaids Anaphylaxis, Hives and Swelling  ?  " all nsaids" ?Other reaction(s): Unknown  ? Salicylates Anaphylaxis, Hives and Swelling  ? Gluten Meal   ? Lactose Intolerance (Gi)   ? Oxycodone Hives  ? Wheat Bran Hives  ?  " all wheat products"  ? ? ?Review of Systems ?All review of systems negative except what is listed in the HPI ? ?   ?Objective:  ?  ?Physical Exam ?Constitutional:   ?   Appearance: Normal appearance.  ?HENT:  ?   Nose: Congestion and rhinorrhea present.  ?   Right Sinus: Maxillary sinus tenderness and frontal sinus tenderness present.  ?   Left Sinus:  Maxillary sinus tenderness and frontal sinus tenderness present.  ?   Mouth/Throat:  ?   Pharynx: Posterior oropharyngeal erythema present.  ?Eyes:  ?   Extraocular Movements: Extraocular movements intact.  ?   Pupils: Pupils are equal, round, and reactive to light.  ?Cardiovascular:  ?   Rate and Rhythm: Normal rate and regular rhythm.  ?   Pulses: Normal pulses.  ?   Heart sounds: Normal heart sounds.  ?Pulmonary:  ?   Effort: Pulmonary effort is normal.  ?   Breath sounds: Normal breath sounds.  ?Musculoskeletal:     ?   General: Normal range of motion.  ?Lymphadenopathy:  ?   Cervical: Cervical adenopathy present.  ?Skin: ?   General: Skin is warm and dry.  ?   Capillary Refill: Capillary refill takes less than 2  seconds.  ?   Findings: Erythema present.  ?   Comments: Erythema and mild edema present to the second and third toes on the right foot. Tenderness present on palpation. Capillary refill brisk. Sensation and ROM intact. Gait intact. Mild amount of warmth present compared to other toes. Toenail intact with no signs of infection. No lacerations or injury present.   ?Neurological:  ?   General: No focal deficit present.  ?   Mental Status: She is alert and oriented to person, place, and time.  ?   Sensory: No sensory deficit.  ?   Motor: No weakness.  ?   Coordination: Coordination normal.  ?   Gait: Gait normal.  ? ? ?BP 128/82   Pulse 97   Temp 99 ?F (37.2 ?C)   Ht 5\' 5"  (1.651 m)   Wt 239 lb 12.8 oz (108.8 kg)   SpO2 99%   BMI 39.90 kg/m?  ?Wt Readings from Last 3 Encounters:  ?03/04/22 239 lb 12.8 oz (108.8 kg)  ?12/31/21 230 lb (104.3 kg)  ?09/01/21 239 lb 8 oz (108.6 kg)  ? ? ?   ?Assessment & Plan:  ? ?Problem List Items Addressed This Visit   ? ? Pain and swelling of toe of right foot - Primary  ?  Suspect cellulitis of unknown etiology present. Skin intact, but erythematous and edematous. There is tenderness on palpation with ROM intact and no pain with movement. Low likelihood of gout, but will treat with steroid in addition to antibiotic therapy to see if we can get this to resolve. No signs of vascular concern.  ?Will also send referral to podiatry for further evaluation in the event this does not resolve.  ?  ?  ? Relevant Medications  ? predniSONE (DELTASONE) 50 MG tablet  ? Other Relevant Orders  ? Ambulatory referral to Podiatry  ? Acute bacterial sinusitis  ?  Symptoms consistent with sinusitis. Given length of time symptoms have been present will begin treatment with antibiotic therapy.  ?Recommend f/u if sx worsen or fail to improve.  ?  ?  ? Relevant Medications  ? predniSONE (DELTASONE) 50 MG tablet  ? doxycycline (VIBRA-TABS) 100 MG tablet  ? ? ? ?Meds ordered this encounter  ?Medications  ?  predniSONE (DELTASONE) 50 MG tablet  ?  Sig: Take 1 tablet by mouth daily.  ?  Dispense:  5 tablet  ?  Refill:  0  ? doxycycline (VIBRA-TABS) 100 MG tablet  ?  Sig: Take 1 tablet by mouth 2  times daily.  ?  Dispense:  14 tablet  ?  Refill:  0  ? ? ? ?H[place]ley Dec  Katheran James, NP ? ?

## 2022-03-04 NOTE — Patient Instructions (Signed)
It was a pleasure seeing you today. I hope your time spent with [place] was pleasant and helpful. Please let us know if there is anything we can do to improve the service you receive.  ? ?Today we discussed concerns with: ? ?Pain and swelling of toe of right foot ? ?Acute bacterial sinusitis ? ?The following orders have been placed for you today: ? ?No orders of the defined types were placed in this encounter. ? ? ? ?The following recommendations are made for your care: ?I have sent the referral to podiatry for you- if this gets better over the weekend we will cancel this  ?If no improvement over the weekend let me know on Monday.  ?Keep taking the augmentin, I am adding prednisone and doxycycline to the regimen to see if this helps with both the toes and sinus infection.  ? ?Important Office Information ?Lab Results ?If labs were ordered, please note that you will see results through MyChart as soon as they come available from [facility].  ?It takes up to 5 business days for the results to be routed to me and for me to review them once all of the lab results have come through from [facility]. I will make recommendations based on your results and send these through MyChart or someone from the office will call you to discuss. If your labs are abnormal, we may contact you to schedule a visit to discuss the results and make recommendations.  ?If you have not heard from [place] within 5 business days or you have waited longer than a week and your lab results have not come through on MyChart, please feel free to call the office or send a message through MyChart to follow-up on these labs.  ? ?Referrals ?If referrals were placed today, the office where the referral was sent will contact you either by phone or through MyChart to set up scheduling. Please note that it can take up to a week for the referral office to contact you. If you do not hear from them in a week, please contact the referral office directly to inquire about  scheduling.  ? ?Condition Treated ?If your condition worsens or you begin to have new symptoms, please schedule a follow-up appointment for further evaluation. If you are not sure if an appointment is needed, you may call the office to leave a message for the nurse and someone will contact you with recommendations.  ?If you have an urgent or life threatening emergency, please do not call the office, but seek emergency evaluation by calling 911 or going to the nearest emergency room for evaluation.  ? ?MyChart and Phone Calls ?Please do not use MyChart for urgent messages. It may take up to 3 business days for MyChart messages to be read by staff and if they are unable to handle the request, an additional 3 business days for them to be routed to me and for my response.  ?Messages sent to the provider through MyChart do not come directly to the provider, please allow time for these messages to be routed and for me to respond.  ?We get a large volume of MyChart messages daily and these are responded to in the order received.  ? ?For urgent messages, please call the office at [phone] and speak with the front office staff or leave a message on the line of my assistant for guidance.  ?We are seeing patients from the hours of 8:00 am through 5:00 pm and calls directly to the  nurse may not be answered immediately due to seeing patients, but your call will be returned as soon as possible.  ?Phone  messages received after 4:00 PM Monday through Thursday may not be returned until the following business day. Phone messages received after 11:00 AM on Friday may not be returned until Monday.  ? ?After Hours ?We share on call hours with providers from other offices. If you have an urgent need after hours that cannot wait until the next business day, please contact the on call provider by calling the office number. A nurse will speak with you and contact the provider if needed for recommendations.  ?If you have an urgent or  life threatening emergency after hours, please do not call the on call provider, but seek emergency evaluation by calling 911 or going to the nearest emergency room for evaluation.  ? ?Paperwork ?All paperwork requires a minimum of 5 days to complete and return to you or the designated personnel. Please keep this in mind when bringing in forms or sending requests for paperwork completion to the office.  ?  ?

## 2022-03-07 ENCOUNTER — Encounter (HOSPITAL_BASED_OUTPATIENT_CLINIC_OR_DEPARTMENT_OTHER): Payer: Self-pay | Admitting: Nurse Practitioner

## 2022-03-11 DIAGNOSIS — M79674 Pain in right toe(s): Secondary | ICD-10-CM | POA: Insufficient documentation

## 2022-03-11 DIAGNOSIS — B9689 Other specified bacterial agents as the cause of diseases classified elsewhere: Secondary | ICD-10-CM | POA: Insufficient documentation

## 2022-03-11 NOTE — Assessment & Plan Note (Signed)
Suspect cellulitis of unknown etiology present. Skin intact, but erythematous and edematous. There is tenderness on palpation with ROM intact and no pain with movement. Low likelihood of gout, but will treat with steroid in addition to antibiotic therapy to see if we can get this to resolve. No signs of vascular concern.  ?Will also send referral to podiatry for further evaluation in the event this does not resolve.  ?

## 2022-03-11 NOTE — Assessment & Plan Note (Signed)
Symptoms consistent with sinusitis. Given length of time symptoms have been present will begin treatment with antibiotic therapy.  ?Recommend f/u if sx worsen or fail to improve.  ?

## 2022-03-26 ENCOUNTER — Other Ambulatory Visit (HOSPITAL_COMMUNITY): Payer: Self-pay

## 2022-03-31 ENCOUNTER — Other Ambulatory Visit (HOSPITAL_COMMUNITY): Payer: Self-pay

## 2022-04-01 ENCOUNTER — Other Ambulatory Visit (HOSPITAL_COMMUNITY): Payer: Self-pay

## 2022-04-27 ENCOUNTER — Other Ambulatory Visit (HOSPITAL_COMMUNITY): Payer: Self-pay

## 2022-04-27 ENCOUNTER — Other Ambulatory Visit (HOSPITAL_BASED_OUTPATIENT_CLINIC_OR_DEPARTMENT_OTHER): Payer: Self-pay | Admitting: Nurse Practitioner

## 2022-04-27 DIAGNOSIS — Z6841 Body Mass Index (BMI) 40.0 and over, adult: Secondary | ICD-10-CM

## 2022-04-27 DIAGNOSIS — R635 Abnormal weight gain: Secondary | ICD-10-CM

## 2022-04-27 MED ORDER — PHENTERMINE HCL 37.5 MG PO CAPS
ORAL_CAPSULE | ORAL | 3 refills | Status: DC
  Filled 2022-04-27: qty 30, 30d supply, fill #0
  Filled 2022-05-27: qty 30, 30d supply, fill #1
  Filled 2022-06-23: qty 30, 30d supply, fill #2

## 2022-04-28 ENCOUNTER — Other Ambulatory Visit (HOSPITAL_COMMUNITY): Payer: Self-pay

## 2022-05-13 DIAGNOSIS — H52223 Regular astigmatism, bilateral: Secondary | ICD-10-CM | POA: Diagnosis not present

## 2022-05-27 ENCOUNTER — Other Ambulatory Visit (HOSPITAL_COMMUNITY): Payer: Self-pay

## 2022-06-23 ENCOUNTER — Other Ambulatory Visit (HOSPITAL_COMMUNITY): Payer: Self-pay

## 2022-07-02 ENCOUNTER — Other Ambulatory Visit (HOSPITAL_COMMUNITY): Payer: Self-pay

## 2022-07-20 ENCOUNTER — Other Ambulatory Visit (HOSPITAL_BASED_OUTPATIENT_CLINIC_OR_DEPARTMENT_OTHER): Payer: Self-pay | Admitting: Nurse Practitioner

## 2022-07-20 DIAGNOSIS — I10 Essential (primary) hypertension: Secondary | ICD-10-CM

## 2022-07-21 ENCOUNTER — Encounter (INDEPENDENT_AMBULATORY_CARE_PROVIDER_SITE_OTHER): Payer: Self-pay

## 2022-07-21 ENCOUNTER — Other Ambulatory Visit (HOSPITAL_COMMUNITY): Payer: Self-pay

## 2022-07-21 MED ORDER — HYDROCHLOROTHIAZIDE 25 MG PO TABS
25.0000 mg | ORAL_TABLET | Freq: Every day | ORAL | 5 refills | Status: DC
  Filled 2022-07-21: qty 30, 30d supply, fill #0
  Filled 2022-08-19: qty 30, 30d supply, fill #1
  Filled 2022-09-20: qty 30, 30d supply, fill #2
  Filled 2022-10-20: qty 30, 30d supply, fill #3
  Filled 2022-11-14: qty 30, 30d supply, fill #4
  Filled 2022-12-21: qty 30, 30d supply, fill #5

## 2022-08-19 ENCOUNTER — Other Ambulatory Visit (HOSPITAL_COMMUNITY): Payer: Self-pay

## 2022-09-06 ENCOUNTER — Other Ambulatory Visit (HOSPITAL_COMMUNITY): Payer: Self-pay

## 2022-09-20 ENCOUNTER — Other Ambulatory Visit (HOSPITAL_COMMUNITY): Payer: Self-pay

## 2022-09-28 ENCOUNTER — Other Ambulatory Visit (HOSPITAL_COMMUNITY): Payer: Self-pay

## 2022-10-14 ENCOUNTER — Other Ambulatory Visit (HOSPITAL_COMMUNITY): Payer: Self-pay

## 2022-10-14 ENCOUNTER — Telehealth: Payer: 59 | Admitting: Family Medicine

## 2022-10-14 DIAGNOSIS — J019 Acute sinusitis, unspecified: Secondary | ICD-10-CM

## 2022-10-14 DIAGNOSIS — B9689 Other specified bacterial agents as the cause of diseases classified elsewhere: Secondary | ICD-10-CM

## 2022-10-14 MED ORDER — AMOXICILLIN-POT CLAVULANATE 875-125 MG PO TABS
1.0000 | ORAL_TABLET | Freq: Two times a day (BID) | ORAL | 0 refills | Status: AC
  Filled 2022-10-14: qty 14, 7d supply, fill #0

## 2022-10-14 NOTE — Progress Notes (Signed)

## 2022-10-20 ENCOUNTER — Other Ambulatory Visit (HOSPITAL_COMMUNITY): Payer: Self-pay

## 2022-11-15 ENCOUNTER — Other Ambulatory Visit (HOSPITAL_COMMUNITY): Payer: Self-pay

## 2022-11-18 ENCOUNTER — Telehealth: Payer: 59 | Admitting: Family Medicine

## 2022-11-18 DIAGNOSIS — J0191 Acute recurrent sinusitis, unspecified: Secondary | ICD-10-CM

## 2022-11-18 NOTE — Progress Notes (Signed)
Donna Tucker   Recurrent sinus infection symptoms in month from last treatment for one.

## 2022-12-02 ENCOUNTER — Other Ambulatory Visit: Payer: Self-pay

## 2022-12-17 ENCOUNTER — Telehealth: Payer: 59 | Admitting: Emergency Medicine

## 2022-12-17 DIAGNOSIS — J029 Acute pharyngitis, unspecified: Secondary | ICD-10-CM | POA: Diagnosis not present

## 2022-12-17 MED ORDER — AMOXICILLIN 500 MG PO CAPS: 500.0000 mg | ORAL_CAPSULE | Freq: Two times a day (BID) | ORAL | 0 refills | Status: AC

## 2022-12-17 NOTE — Progress Notes (Signed)
E-Visit for Sore Throat - Strep Symptoms  We are sorry that you are not feeling well.  Here is how we plan to help!  Based on what you have shared with me it is likely that you have strep pharyngitis.  Strep pharyngitis is inflammation and infection in the back of the throat.  This is an infection cause by bacteria and is treated with antibiotics.  I have prescribed Amoxicillin 500 mg twice a day for 10 days. For throat pain, we recommend over the counter oral pain relief medications such as acetaminophen or aspirin, or anti-inflammatory medications such as ibuprofen or naproxen sodium. Topical treatments such as oral throat lozenges or sprays may be used as needed. Strep infections are not as easily transmitted as other respiratory infections, however we still recommend that you avoid close contact with loved ones, especially the very young and elderly.  Remember to wash your hands thoroughly throughout the day as this is the number one way to prevent the spread of infection and wipe down door knobs and counters with disinfectant.  Because you also have nasal congestion, I strongly recommend nasal saline irrigation. Try using saline irrigation, such as with a neti pot, several times a day while you are sick. Many neti pots come with salt packets premeasured to use to make saline. If you use your own salt, make sure it is kosher salt or sea salt (don't use table salt as it has iodine in it and you don't need that in your nose). Use distilled water to make saline. If you mix your own saline using your own salt, the recipe is 1/4 teaspoon salt in 1 cup warm water. Using saline irrigation can help prevent and treat sinus infections.   Home Care: Only take medications as instructed by your medical team. Complete the entire course of an antibiotic. Do not take these medications with alcohol. A steam or ultrasonic humidifier can help congestion.  You can place a towel over your head and breathe in the steam  from hot water coming from a faucet. Avoid close contacts especially the very young and the elderly. Cover your mouth when you cough or sneeze. Always remember to wash your hands.  Get Help Right Away If: You develop worsening fever or sinus pain. You develop a severe head ache or visual changes. Your symptoms persist after you have completed your treatment plan.  Make sure you Understand these instructions. Will watch your condition. Will get help right away if you are not doing well or get worse.   Thank you for choosing an e-visit.  Your e-visit answers were reviewed by a board certified advanced clinical practitioner to complete your personal care plan. Depending upon the condition, your plan could have included both over the counter or prescription medications.  Please review your pharmacy choice. Make sure the pharmacy is open so you can pick up prescription now. If there is a problem, you may contact your provider through [employer] and have the prescription routed to another pharmacy.  Your safety is important to [place]. If you have drug allergies check your prescription carefully.   For the next 24 hours you can use MyChart to ask questions about today's visit, request a non-urgent call back, or ask for a work or school excuse. You will get an email in the next two days asking about your experience. I hope that your e-visit has been valuable and will speed your recovery.  I have spent 5 minutes in review of e-visit  questionnaire, review and updating patient chart, medical decision making and response to patient.   Willeen Cass, PhD, FNP-BC

## 2022-12-21 ENCOUNTER — Other Ambulatory Visit (HOSPITAL_BASED_OUTPATIENT_CLINIC_OR_DEPARTMENT_OTHER): Payer: Self-pay | Admitting: Nurse Practitioner

## 2022-12-21 ENCOUNTER — Other Ambulatory Visit: Payer: Self-pay

## 2022-12-21 ENCOUNTER — Encounter: Payer: Self-pay | Admitting: Internal Medicine

## 2022-12-21 DIAGNOSIS — F419 Anxiety disorder, unspecified: Secondary | ICD-10-CM

## 2022-12-21 NOTE — Telephone Encounter (Signed)
Looks like this patient is scheduled with Dr. Burnard Bunting later this month.

## 2022-12-22 ENCOUNTER — Encounter (HOSPITAL_BASED_OUTPATIENT_CLINIC_OR_DEPARTMENT_OTHER): Payer: Self-pay | Admitting: Family Medicine

## 2022-12-22 DIAGNOSIS — F419 Anxiety disorder, unspecified: Secondary | ICD-10-CM

## 2022-12-22 MED ORDER — SERTRALINE HCL 25 MG PO TABS
ORAL_TABLET | Freq: Every day | ORAL | 3 refills | Status: DC
  Filled 2022-12-22: qty 90, 90d supply, fill #0

## 2022-12-23 ENCOUNTER — Other Ambulatory Visit (HOSPITAL_COMMUNITY): Payer: Self-pay

## 2022-12-23 MED ORDER — SERTRALINE HCL 25 MG PO TABS
25.0000 mg | ORAL_TABLET | Freq: Every day | ORAL | 1 refills | Status: DC
  Filled 2022-12-23: qty 90, 90d supply, fill #0
  Filled 2023-03-20: qty 90, 90d supply, fill #1

## 2023-01-06 ENCOUNTER — Ambulatory Visit (HOSPITAL_BASED_OUTPATIENT_CLINIC_OR_DEPARTMENT_OTHER): Payer: Self-pay | Admitting: Family Medicine

## 2023-01-12 ENCOUNTER — Encounter: Payer: Self-pay | Admitting: Family Medicine

## 2023-01-12 ENCOUNTER — Other Ambulatory Visit (HOSPITAL_COMMUNITY): Payer: Self-pay

## 2023-01-12 ENCOUNTER — Ambulatory Visit (INDEPENDENT_AMBULATORY_CARE_PROVIDER_SITE_OTHER): Payer: 59 | Admitting: Family Medicine

## 2023-01-12 VITALS — BP 136/90 | HR 83 | Temp 98.0°F | Ht 64.5 in | Wt 261.5 lb

## 2023-01-12 DIAGNOSIS — E890 Postprocedural hypothyroidism: Secondary | ICD-10-CM | POA: Diagnosis not present

## 2023-01-12 DIAGNOSIS — Z6841 Body Mass Index (BMI) 40.0 and over, adult: Secondary | ICD-10-CM

## 2023-01-12 DIAGNOSIS — F419 Anxiety disorder, unspecified: Secondary | ICD-10-CM | POA: Diagnosis not present

## 2023-01-12 DIAGNOSIS — Z7689 Persons encountering health services in other specified circumstances: Secondary | ICD-10-CM | POA: Diagnosis not present

## 2023-01-12 DIAGNOSIS — G4709 Other insomnia: Secondary | ICD-10-CM | POA: Diagnosis not present

## 2023-01-12 MED ORDER — TRAZODONE HCL 50 MG PO TABS
25.0000 mg | ORAL_TABLET | Freq: Every evening | ORAL | 0 refills | Status: DC | PRN
  Filled 2023-01-12: qty 30, 60d supply, fill #0

## 2023-01-12 NOTE — Progress Notes (Signed)
New Patient Office Visit  Subjective    Patient ID: Donna Tucker, female    DOB: July 18, 1986  Age: 37 y.o. MRN: [id number]  CC:  Chief Complaint  Patient presents with   New Patient (Initial Visit)    Patient in office to est PCP - discuss weight management/ health improvement.     HPI Donna Tucker presents to establish care  Pt has hx of Graves disease. She then had thyroid removed Nov 2019 and now on synthroid. Taking it every morning by itself with full glass of water.  She has hx of HTN and taking HCTZ 25mg  daily. She has vitamin D deficiency and taking vitamin D with calcium supplements.  Pt is taking Zoloft 25mg  for anxiety. Pt is a mother of 3 children 81, 41 and 3 y.o. She is trying to live a healthier lifestyle such as keto and was on Ozempic x 4 months with no weight loss. She also tried Phentermine but had insomnia with this one.  She walks 3-4 miles a few times a week. Has children so constantly active with them. She denies snoring unless if she's sick. She routinely sleeps 5 hours of night. She has tried melatonin at night but doesn't help her stay to sleep. She is in bed 930pm, up at 1am to urinate, and up at 4am. She has stressors and does feel like it's hard to shut down her mind. Pt reports an episode 6 weeks ago with increased anxiety but has resolved. Feels like the Zoloft is helping her during the day. [place] Office Visit from 01/12/2023 in [place] at [facility]  PHQ-9 Total Score 9          01/12/2023   11:22 AM 06/01/2021    3:44 PM 07/24/2020    9:18 AM  GAD 7 : Generalized Anxiety Score  Nervous, Anxious, on Edge 1 1 1   Control/stop worrying 1 1 0  Worry too much - different things 2 0 1  Trouble relaxing 3 0 1  Restless 2 1 0  Easily annoyed or irritable 1 0 0  Afraid - awful might happen 0 0 1  Total GAD 7 Score 10 3 4   Anxiety Difficulty Somewhat difficult Not difficult at all Not difficult at all     Outpatient  Encounter Medications as of 01/12/2023  Medication Sig   Calcium Carbonate (CALCIUM 500 PO) Take 1 tablet by mouth at bedtime.   hydrochlorothiazide (HYDRODIURIL) 25 MG tablet Take 1 tablet by mouth daily.   levothyroxine (SYNTHROID) 125 MCG tablet Take 1 tablet by mouth in the morning on an empty stomach.   oxymetazoline (AFRIN) 0.05 % nasal spray Place 1 spray into both nostrils 2 (two) times daily.   sertraline (ZOLOFT) 25 MG tablet Take 1 tablet (25 mg total) by mouth daily.   traZODone (DESYREL) 50 MG tablet Take 0.5 tablets (25 mg total) by mouth at bedtime as needed for sleep.   VITAMIN D, CHOLECALCIFEROL, PO Take 1 tablet by mouth at bedtime.   [DISCONTINUED] levothyroxine (SYNTHROID) 125 MCG tablet TAKE 1 TABLET BY MOUTH IN THE MORNING ON AN EMPTY STOMACH   [DISCONTINUED] phentermine 37.5 MG capsule Take 1 capsule by mouth every morning   [DISCONTINUED] predniSONE (DELTASONE) 50 MG tablet Take 1 tablet by mouth daily.   [DISCONTINUED] Vitamin D, Ergocalciferol, (DRISDOL) 1.25 MG (50000 UNIT) CAPS capsule Take 1 capsule by mouth every 7 (seven) days. Take for 8 total doses(weeks)   No facility-administered  encounter medications on file as of 01/12/2023.    Past Medical History:  Diagnosis Date   Anxiety    Blood type O+    from records review   BMI 39.0-39.9,adult 07/24/2020   Complication of anesthesia    post partum itching   Depression    Elevated blood-pressure reading without diagnosis of hypertension 07/24/2020   Gestational hypertension 10/01/2014   Gluten intolerance    Graves' disease    endocrinologist--- dr Talmage Nap-- dx 2012   Hx of pre-eclampsia in prior pregnancy, currently pregnant    Hx of varicella    Hypothyroidism    Obesity 08/14/2013   Postpartum care following cesarean delivery (1/31) 01/14/2020   Pregnancy induced hypertension    Previous cesarean delivery affecting pregnancy 01/13/2020   Skin sensation disturbance 06/01/2021   Tachycardia    Wears  glasses    Wheat allergy     Past Surgical History:  Procedure Laterality Date   CESAREAN SECTION N/A 10/02/2014   Procedure: CESAREAN SECTION;  Surgeon: Lenoard Aden, MD;  Location: [facility];  Service: Obstetrics;  Laterality: N/A;   CESAREAN SECTION N/A 01/13/2020   Procedure: Repeat CESAREAN SECTION;  Surgeon: Olivia Mackie, MD;  Location: [facility];  Service: Obstetrics;  Laterality: N/A;  EDD: 02/03/20   THYROIDECTOMY N/A 11/02/2018   Procedure: TOTAL THYROIDECTOMY;  Surgeon: Darnell Level, MD;  Location: [facility];  Service: General;  Laterality: N/A;    Family History  Problem Relation Age of Onset   Diabetes Mother        T1DM   Stroke Mother    Hypothyroidism Mother    CVA Mother    Hypertension Father    Depression Brother    Hypothyroidism Brother    Diabetes Maternal Aunt    Graves' disease Maternal Aunt    Cancer Maternal Aunt        x 4 aunts breast cancer -   Heart attack Maternal Aunt    Cancer Paternal Aunt        breast, pancreatic   CVA Maternal Grandmother    Cancer Maternal Grandmother        breast   Graves' disease Maternal Grandfather    CVA Paternal Grandmother    Alzheimer's disease Paternal Grandmother    Hyperlipidemia Paternal Grandfather    Hypertension Paternal Grandfather    Cancer Cousin        breast   Graves' disease Cousin    Breast cancer Cousin        breast cancer in 3 cousins   Cancer Other        Aunt- colon, Aunt-liver    Social History   Socioeconomic History   Marital status: Married    Spouse name: Metallurgist   Number of children: 3   Years of education: Not on file   Highest education level: Not on file  Occupational History   Not on file  Tobacco Use   Smoking status: Never   Smokeless tobacco: Never  Vaping Use   Vaping Use: Never used  Substance and Sexual Activity   Alcohol use: Yes    Alcohol/week: 3.0 standard drinks of alcohol    Types: 3 Glasses of wine per week   Drug use: Never   Sexual  activity: Yes    Partners: Male    Birth control/protection: Condom  Other Topics Concern   Not on file  Social History Narrative   Not on file   Social Determinants of Health  Financial Resource Strain: Not on file  Food Insecurity: Not on file  Transportation Needs: Not on file  Physical Activity: Not on file  Stress: Not on file  Social Connections: Not on file  Intimate Partner Violence: Not on file    Review of Systems  Psychiatric/Behavioral:  The patient has insomnia.   All other systems reviewed and are negative.       Objective    BP (!) 136/90   Pulse 83   Temp 98 F (36.7 C)   Ht 5' 4.5" (1.638 m)   Wt 261 lb 8 oz (118.6 kg)   SpO2 99%   BMI 44.19 kg/m   Physical Exam Vitals and nursing note reviewed.  Constitutional:      Appearance: Normal appearance. She is obese.  HENT:     Head: Normocephalic and atraumatic.     Right Ear: Tympanic membrane, ear canal and external ear normal.     Left Ear: Tympanic membrane, ear canal and external ear normal.     Nose: Nose normal.     Mouth/Throat:     Mouth: Mucous membranes are moist.     Pharynx: Oropharynx is clear.  Eyes:     Conjunctiva/sclera: Conjunctivae normal.     Pupils: Pupils are equal, round, and reactive to light.  Cardiovascular:     Rate and Rhythm: Normal rate and regular rhythm.     Pulses: Normal pulses.     Heart sounds: Normal heart sounds.  Pulmonary:     Effort: Pulmonary effort is normal.     Breath sounds: Normal breath sounds.  Abdominal:     General: Abdomen is flat.  Skin:    General: Skin is warm.     Capillary Refill: Capillary refill takes less than 2 seconds.  Neurological:     General: No focal deficit present.     Mental Status: She is alert and oriented to person, place, and time. Mental status is at baseline.  Psychiatric:        Mood and Affect: Mood normal.        Behavior: Behavior normal.        Thought Content: Thought content normal.        Judgment:  Judgment normal.        Assessment & Plan:   Problem List Items Addressed This Visit       Endocrine   Postoperative hypothyroidism   Relevant Orders   TSH   T4, free     Other   Anxiety   Relevant Medications   traZODone (DESYREL) 50 MG tablet   BMI 40.0-44.9, adult (HCC)   Relevant Orders   Amb Ref to Medical Weight Management   Other Visit Diagnoses     Encounter to establish care with new doctor    -  Primary   Other insomnia       Relevant Medications   traZODone (DESYREL) 50 MG tablet      Has tried diet and exercise. Will refer to [facility] for further treatment options for weight loss. Due to insomnia and anxiety, check thyroid function. Hasn't been checked in over a year and has been on same dose for a while. Adjust dose if indicated. Trial of low dose Trazodone 50mg  1/2 tab po nightly. If not effective, advised pt to refer her for sleep study. She gets up once to urinate but unable to go back to sleep despite Melatonin usage. She also reports thinking during the night so  unsure if the anxiety is affecting sleep? Will follow up in 6 weeks during annual, sooner prn.  Return in about 7 weeks (around 02/28/2023) for Annual Physical and Pap.   Leeanne Rio, MD

## 2023-01-13 LAB — T4, FREE: Free T4: 1.29 ng/dL (ref 0.82–1.77)

## 2023-01-13 LAB — TSH: TSH: 3.59 u[IU]/mL (ref 0.450–4.500)

## 2023-01-21 ENCOUNTER — Encounter: Payer: Self-pay | Admitting: Family Medicine

## 2023-01-24 ENCOUNTER — Other Ambulatory Visit (HOSPITAL_BASED_OUTPATIENT_CLINIC_OR_DEPARTMENT_OTHER): Payer: Self-pay | Admitting: Nurse Practitioner

## 2023-01-24 ENCOUNTER — Other Ambulatory Visit (HOSPITAL_COMMUNITY): Payer: Self-pay

## 2023-01-24 DIAGNOSIS — I10 Essential (primary) hypertension: Secondary | ICD-10-CM

## 2023-01-24 MED ORDER — HYDROCHLOROTHIAZIDE 25 MG PO TABS
25.0000 mg | ORAL_TABLET | Freq: Every day | ORAL | 5 refills | Status: DC
  Filled 2023-01-24: qty 30, 30d supply, fill #0
  Filled 2023-02-18: qty 30, 30d supply, fill #1
  Filled 2023-03-20: qty 30, 30d supply, fill #2
  Filled 2023-04-24 – 2023-04-25 (×2): qty 30, 30d supply, fill #3
  Filled 2023-05-20: qty 30, 30d supply, fill #4
  Filled 2023-06-23: qty 30, 30d supply, fill #5

## 2023-03-02 ENCOUNTER — Other Ambulatory Visit (HOSPITAL_COMMUNITY): Payer: Self-pay

## 2023-03-02 ENCOUNTER — Ambulatory Visit (INDEPENDENT_AMBULATORY_CARE_PROVIDER_SITE_OTHER): Payer: 59 | Admitting: Family Medicine

## 2023-03-02 ENCOUNTER — Encounter: Payer: Self-pay | Admitting: Family Medicine

## 2023-03-02 VITALS — BP 128/85 | HR 78 | Temp 98.1°F | Resp 18 | Ht 64.5 in | Wt 271.4 lb

## 2023-03-02 DIAGNOSIS — Z1159 Encounter for screening for other viral diseases: Secondary | ICD-10-CM

## 2023-03-02 DIAGNOSIS — E890 Postprocedural hypothyroidism: Secondary | ICD-10-CM

## 2023-03-02 DIAGNOSIS — R7302 Impaired glucose tolerance (oral): Secondary | ICD-10-CM | POA: Diagnosis not present

## 2023-03-02 DIAGNOSIS — Z13220 Encounter for screening for lipoid disorders: Secondary | ICD-10-CM

## 2023-03-02 DIAGNOSIS — L720 Epidermal cyst: Secondary | ICD-10-CM

## 2023-03-02 DIAGNOSIS — G4709 Other insomnia: Secondary | ICD-10-CM | POA: Diagnosis not present

## 2023-03-02 DIAGNOSIS — Z124 Encounter for screening for malignant neoplasm of cervix: Secondary | ICD-10-CM | POA: Diagnosis not present

## 2023-03-02 DIAGNOSIS — Z136 Encounter for screening for cardiovascular disorders: Secondary | ICD-10-CM | POA: Diagnosis not present

## 2023-03-02 DIAGNOSIS — Z0000 Encounter for general adult medical examination without abnormal findings: Secondary | ICD-10-CM

## 2023-03-02 MED ORDER — LEVOTHYROXINE SODIUM 125 MCG PO TABS
ORAL_TABLET | ORAL | 3 refills | Status: DC
  Filled 2023-03-02: qty 90, 90d supply, fill #0
  Filled 2023-06-03: qty 90, 90d supply, fill #1
  Filled 2023-09-12: qty 90, 90d supply, fill #2
  Filled 2023-12-12: qty 90, 90d supply, fill #3

## 2023-03-02 MED ORDER — TRAZODONE HCL 50 MG PO TABS
25.0000 mg | ORAL_TABLET | Freq: Every evening | ORAL | 0 refills | Status: DC | PRN
  Filled 2023-03-02 – 2023-03-20 (×2): qty 45, 90d supply, fill #0

## 2023-03-02 NOTE — Progress Notes (Signed)
Complete physical exam  Patient: Donna Tucker   DOB: 1986-06-02   37 y.o. Female  MRN: [id number]  Subjective:    No chief complaint on file.   Donna Tucker is a 37 y.o. female who presents today for a complete physical exam. She reports consuming a general diet. Home exercise routine includes walking 4 days a week 1  hrs per days. She generally feels well. She reports sleeping well. She does have additional problems to discuss today.  Pt needs refills on thyroid medicine She reports sleeping much better with Trazodone 50mg  1/2 tab po at night. She has a cyst on her forehead that she would like removed. Been present for 6 months and not resolving. Somewhat painful when it's touched.  Most recent fall risk assessment:    01/12/2023   11:21 AM  [place] in the past year? 0  Number falls in past yr: 0  Injury with Fall? 0  Risk for fall due to : No Fall Risks  Follow up Falls evaluation completed     Most recent depression screenings:    01/12/2023   11:22 AM 03/04/2022    2:04 PM  PHQ 2/9 Scores  PHQ - 2 Score 2 0  PHQ- 9 Score 9   Exception Documentation  Medical reason    Vision:Within last year and Dental: No current dental problems  Patient Active Problem List   Diagnosis Date Noted   Hypertension 06/01/2021   Encounter for medical examination to establish care 06/01/2021   Weight gain 06/01/2021   BMI 40.0-44.9, adult ([place]) 06/01/2021   Depression 07/24/2020   Anxiety 07/24/2020   History of anemia 07/24/2020   Vitamin D deficiency 07/24/2020   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   Cesarean delivery due to maternal disorder 01/13/2020   Graves' disease 08/14/2013   Past Medical History:  Diagnosis Date   Anxiety    Blood type O+    from records review   BMI 39.0-39.9,adult 0000000   Complication of anesthesia    post partum itching   Depression    Elevated blood-pressure reading without diagnosis of hypertension 07/24/2020    Gestational hypertension 10/01/2014   Gluten intolerance    Graves' disease    endocrinologist--- dr Chalmers Cater-- dx 2012   Hx of pre-eclampsia in prior pregnancy, currently pregnant    Hx of varicella    Hypothyroidism    Obesity 08/14/2013   Postpartum care following cesarean delivery (1/31) 01/14/2020   Pregnancy induced hypertension    Previous cesarean delivery affecting pregnancy 01/13/2020   Skin sensation disturbance 06/01/2021   Tachycardia    Wears glasses    Wheat allergy    Past Surgical History:  Procedure Laterality Date   CESAREAN SECTION N/A 10/02/2014   Procedure: CESAREAN SECTION;  Surgeon: Lovenia Kim, MD;  Location: [facility];  Service: Obstetrics;  Laterality: N/A;   CESAREAN SECTION N/A 01/13/2020   Procedure: Repeat CESAREAN SECTION;  Surgeon: Brien Few, MD;  Location: [facility];  Service: Obstetrics;  Laterality: N/A;  EDD: 02/03/20   THYROIDECTOMY N/A 11/02/2018   Procedure: TOTAL THYROIDECTOMY;  Surgeon: Armandina Gemma, MD;  Location: [facility];  Service: General;  Laterality: N/A;      Patient Care Team: Leeanne Rio, MD as PCP - General (Family Medicine) Brien Few, MD as Consulting Physician (Obstetrics and Gynecology) Jacelyn Pi, MD as Referring Physician (Endocrinology)   Outpatient Medications Prior to Visit  Medication Sig   Calcium Carbonate (CALCIUM  500 PO) Take 1 tablet by mouth at bedtime.   hydrochlorothiazide (HYDRODIURIL) 25 MG tablet Take 1 tablet by mouth daily.   levothyroxine (SYNTHROID) 125 MCG tablet Take 1 tablet by mouth in the morning on an empty stomach.   oxymetazoline (AFRIN) 0.05 % nasal spray Place 1 spray into both nostrils 2 (two) times daily.   sertraline (ZOLOFT) 25 MG tablet Take 1 tablet (25 mg total) by mouth daily.   traZODone (DESYREL) 50 MG tablet Take 0.5 tablets (25 mg total) by mouth at bedtime as needed for sleep.   VITAMIN D, CHOLECALCIFEROL, PO Take 1 tablet by mouth at bedtime.   No  facility-administered medications prior to visit.    Review of Systems  Skin:        Lump on forehead  All other systems reviewed and are negative.        Objective:     There were no vitals taken for this visit. BP Readings from Last 3 Encounters:  03/02/23 128/85  01/12/23 (!) 136/90  03/04/22 128/82      Physical Exam Vitals and nursing note reviewed.  Constitutional:      Appearance: Normal appearance. She is obese.  HENT:     Head: Normocephalic and atraumatic.     Comments: Cyst to forehead 1x1 cm    Right Ear: Tympanic membrane, ear canal and external ear normal.     Left Ear: Tympanic membrane, ear canal and external ear normal.     Nose: Nose normal.     Mouth/Throat:     Mouth: Mucous membranes are moist.     Pharynx: Oropharynx is clear.  Eyes:     Extraocular Movements: Extraocular movements intact.     Conjunctiva/sclera: Conjunctivae normal.     Pupils: Pupils are equal, round, and reactive to light.  Cardiovascular:     Rate and Rhythm: Normal rate and regular rhythm.     Pulses: Normal pulses.     Heart sounds: Normal heart sounds.  Pulmonary:     Effort: Pulmonary effort is normal.     Breath sounds: Normal breath sounds.  Abdominal:     General: Abdomen is flat. Bowel sounds are normal.  Musculoskeletal:        General: Normal range of motion.  Skin:    General: Skin is warm.     Capillary Refill: Capillary refill takes less than 2 seconds.  Neurological:     General: No focal deficit present.     Mental Status: She is alert and oriented to person, place, and time. Mental status is at baseline.  Psychiatric:        Mood and Affect: Mood normal.        Behavior: Behavior normal.        Thought Content: Thought content normal.        Judgment: Judgment normal.     No results found for any visits on 03/02/23. Last CBC Lab Results  Component Value Date   WBC 8.5 06/01/2021   HGB 15.2 06/01/2021   HCT 45.6 06/01/2021   MCV 93  06/01/2021   MCH 30.8 06/01/2021   RDW 12.6 06/01/2021   PLT 324 [id number]   Last metabolic panel Lab Results  Component Value Date   GLUCOSE 82 06/01/2021   NA 139 06/01/2021   K 4.3 06/01/2021   CL 101 06/01/2021   CO2 24 06/01/2021   BUN 8 06/01/2021   CREATININE 0.72 06/01/2021   EGFR 112 06/01/2021   CALCIUM  9.4 06/01/2021   PROT 7.8 06/01/2021   ALBUMIN 4.9 (H) 06/01/2021   LABGLOB 2.9 06/01/2021   AGRATIO 1.7 06/01/2021   BILITOT 0.2 06/01/2021   ALKPHOS 117 06/01/2021   AST 17 06/01/2021   ALT 17 06/01/2021   ANIONGAP 12 01/12/2020   Last hemoglobin A1c Lab Results  Component Value Date   HGBA1C 4.6 06/01/2021        Assessment & Plan:    Routine Health Maintenance and Physical Exam  Immunization History  Administered Date(s) Administered   Influenza Split 10/12/2022   Influenza,inj,Quad PF,6+ Mos 10/03/2014   Influenza-Unspecified 09/15/2017, 08/22/2018, 09/12/2021   MMR 10/04/2014   PFIZER(Purple Top)SARS-COV-2 Vaccination 12/24/2019, 01/21/2020, 10/31/2020   Pfizer Covid-19 Vaccine Bivalent Booster 32yrs & up 09/30/2021   [place], IM 11/21/2014, 11/24/2014, 11/28/2014, 12/05/2014   Tdap 02/12/2008, 04/01/2017    Health Maintenance  Topic Date Due   Hepatitis C Screening  Never done   COVID-19 Vaccine (5 - 2023-24 season) 08/13/2022   PAP SMEAR-Modifier  02/26/2023   DTaP/Tdap/Td (3 - Td or Tdap) 04/02/2027   INFLUENZA VACCINE  Completed   HIV Screening  Completed   HPV VACCINES  Aged Out    Discussed health benefits of physical activity, and encouraged her to engage in regular exercise appropriate for her age and condition.  Problem List Items Addressed This Visit   None Visit Diagnoses     Annual physical exam    -  Primary   Encounter for lipid screening for cardiovascular disease       Relevant Orders   Lipid panel   Need for hepatitis C screening test       Relevant Orders   Hepatitis C antibody   Impaired glucose tolerance        Relevant Orders   CBC with Differential/Platelet   Comprehensive metabolic panel   Hemoglobin A1c   Screening for cervical cancer       Relevant Orders   IGP,CtNgTv,Apt HPV      No follow-ups on file. Annual physical exam  Encounter for lipid screening for cardiovascular disease -     Lipid panel  Need for hepatitis C screening test -     Hepatitis C antibody  Impaired glucose tolerance -     CBC with Differential/Platelet -     Comprehensive metabolic panel -     Hemoglobin A1c  Screening for cervical cancer -     IGP,CtNgTv,Apt HPV  Epidermoid cyst -     Ambulatory referral to Dermatology  Other insomnia -     traZODone HCl; Take 0.5 tablets (25 mg total) by mouth at bedtime as needed for sleep.  Dispense: 45 tablet; Refill: 0  Postoperative hypothyroidism -     Levothyroxine Sodium; Take 1 tablet by mouth in the morning on an empty stomach.  Dispense: 90 tablet; Refill: 3   Screening labs today with pap Refilled thyroid medicine Synthroid 130mcg daily and trazodone 25mg  QHS. Refer to dermatology for further management of likely epidermoid cyst     Leeanne Rio, MD

## 2023-03-03 ENCOUNTER — Other Ambulatory Visit (HOSPITAL_COMMUNITY): Payer: Self-pay

## 2023-03-04 LAB — CBC WITH DIFFERENTIAL/PLATELET
Basophils Absolute: 0.1 10*3/uL (ref 0.0–0.2)
Basos: 1 %
EOS (ABSOLUTE): 0.2 10*3/uL (ref 0.0–0.4)
Eos: 4 %
Hematocrit: 42.6 % (ref 34.0–46.6)
Hemoglobin: 14.3 g/dL (ref 11.1–15.9)
Immature Grans (Abs): 0.1 10*3/uL (ref 0.0–0.1)
Immature Granulocytes: 1 %
Lymphocytes Absolute: 2.1 10*3/uL (ref 0.7–3.1)
Lymphs: 31 %
MCH: 31 pg (ref 26.6–33.0)
MCHC: 33.6 g/dL (ref 31.5–35.7)
MCV: 92 fL (ref 79–97)
Monocytes Absolute: 0.4 10*3/uL (ref 0.1–0.9)
Monocytes: 6 %
Neutrophils Absolute: 4 10*3/uL (ref 1.4–7.0)
Neutrophils: 57 %
Platelets: 282 10*3/uL (ref 150–450)
RBC: 4.62 x10E6/uL (ref 3.77–5.28)
RDW: 13.3 % (ref 11.7–15.4)
WBC: 6.9 10*3/uL (ref 3.4–10.8)

## 2023-03-04 LAB — COMPREHENSIVE METABOLIC PANEL
ALT: 18 IU/L (ref 0–32)
AST: 16 IU/L (ref 0–40)
Albumin/Globulin Ratio: 1.3 (ref 1.2–2.2)
Albumin: 4.4 g/dL (ref 3.9–4.9)
Alkaline Phosphatase: 111 IU/L (ref 44–121)
BUN/Creatinine Ratio: 17 (ref 9–23)
BUN: 13 mg/dL (ref 6–20)
Bilirubin Total: 0.4 mg/dL (ref 0.0–1.2)
CO2: 25 mmol/L (ref 20–29)
Calcium: 9.6 mg/dL (ref 8.7–10.2)
Chloride: 96 mmol/L (ref 96–106)
Creatinine, Ser: 0.76 mg/dL (ref 0.57–1.00)
Globulin, Total: 3.5 g/dL (ref 1.5–4.5)
Glucose: 81 mg/dL (ref 70–99)
Potassium: 3.7 mmol/L (ref 3.5–5.2)
Sodium: 137 mmol/L (ref 134–144)
Total Protein: 7.9 g/dL (ref 6.0–8.5)
eGFR: 104 mL/min/{1.73_m2} (ref >59)

## 2023-03-04 LAB — LIPID PANEL
Chol/HDL Ratio: 6.5 ratio — ABNORMAL HIGH (ref 0.0–4.4)
Cholesterol, Total: 312 mg/dL — ABNORMAL HIGH (ref 100–199)
HDL: 48 mg/dL (ref >39)
LDL Chol Calc (NIH): 224 mg/dL — ABNORMAL HIGH (ref 0–99)
Triglycerides: 201 mg/dL — ABNORMAL HIGH (ref 0–149)
VLDL Cholesterol Cal: 40 mg/dL (ref 5–40)

## 2023-03-04 LAB — HEPATITIS C ANTIBODY: Hep C Virus Ab: NONREACTIVE

## 2023-03-04 LAB — HEMOGLOBIN A1C
Est. average glucose Bld gHb Est-mCnc: 100 mg/dL
Hgb A1c MFr Bld: 5.1 % (ref 4.8–5.6)

## 2023-03-07 LAB — IGP,CTNGTV,APT HPV
Chlamydia, Nuc. Acid Amp: NEGATIVE
Gonococcus, Nuc. Acid Amp: NEGATIVE
HPV Aptima: NEGATIVE
PAP Smear Comment: 0
Trich vag by NAA: NEGATIVE

## 2023-03-21 ENCOUNTER — Other Ambulatory Visit (HOSPITAL_COMMUNITY): Payer: Self-pay

## 2023-04-22 ENCOUNTER — Encounter: Payer: Self-pay | Admitting: Family Medicine

## 2023-04-22 DIAGNOSIS — L720 Epidermal cyst: Secondary | ICD-10-CM

## 2023-04-25 ENCOUNTER — Other Ambulatory Visit (HOSPITAL_COMMUNITY): Payer: Self-pay

## 2023-06-16 ENCOUNTER — Other Ambulatory Visit: Payer: Self-pay | Admitting: Family Medicine

## 2023-06-16 ENCOUNTER — Other Ambulatory Visit (HOSPITAL_BASED_OUTPATIENT_CLINIC_OR_DEPARTMENT_OTHER): Payer: Self-pay | Admitting: Family Medicine

## 2023-06-16 DIAGNOSIS — G4709 Other insomnia: Secondary | ICD-10-CM

## 2023-06-16 DIAGNOSIS — F419 Anxiety disorder, unspecified: Secondary | ICD-10-CM

## 2023-06-17 ENCOUNTER — Other Ambulatory Visit (HOSPITAL_COMMUNITY): Payer: Self-pay

## 2023-06-20 ENCOUNTER — Other Ambulatory Visit (HOSPITAL_COMMUNITY): Payer: Self-pay

## 2023-06-20 ENCOUNTER — Encounter: Payer: Self-pay | Admitting: Family Medicine

## 2023-06-20 MED ORDER — TRAZODONE HCL 50 MG PO TABS
25.0000 mg | ORAL_TABLET | Freq: Every evening | ORAL | 1 refills | Status: DC | PRN
  Filled 2023-06-20: qty 45, 90d supply, fill #0
  Filled 2023-10-13: qty 45, 90d supply, fill #1

## 2023-06-21 ENCOUNTER — Other Ambulatory Visit (HOSPITAL_COMMUNITY): Payer: Self-pay

## 2023-06-21 ENCOUNTER — Other Ambulatory Visit: Payer: Self-pay

## 2023-06-21 DIAGNOSIS — F419 Anxiety disorder, unspecified: Secondary | ICD-10-CM

## 2023-06-21 MED ORDER — SERTRALINE HCL 25 MG PO TABS
25.0000 mg | ORAL_TABLET | Freq: Every day | ORAL | 1 refills | Status: DC
Start: 2023-06-21 — End: 2023-12-21
  Filled 2023-06-21: qty 90, 90d supply, fill #0
  Filled 2023-09-19: qty 90, 90d supply, fill #1

## 2023-06-27 ENCOUNTER — Other Ambulatory Visit: Payer: Self-pay | Admitting: Oncology

## 2023-06-27 DIAGNOSIS — Z006 Encounter for examination for normal comparison and control in clinical research program: Secondary | ICD-10-CM

## 2023-07-20 ENCOUNTER — Other Ambulatory Visit (HOSPITAL_BASED_OUTPATIENT_CLINIC_OR_DEPARTMENT_OTHER): Payer: Self-pay | Admitting: Family Medicine

## 2023-07-20 ENCOUNTER — Other Ambulatory Visit (HOSPITAL_COMMUNITY): Payer: Self-pay

## 2023-07-20 DIAGNOSIS — I10 Essential (primary) hypertension: Secondary | ICD-10-CM

## 2023-07-20 MED ORDER — HYDROCHLOROTHIAZIDE 25 MG PO TABS
25.0000 mg | ORAL_TABLET | Freq: Every day | ORAL | 5 refills | Status: DC
Start: 2023-07-20 — End: 2024-01-22
  Filled 2023-07-20: qty 30, 30d supply, fill #0
  Filled 2023-08-21: qty 30, 30d supply, fill #1
  Filled 2023-09-19: qty 30, 30d supply, fill #2
  Filled 2023-10-24: qty 30, 30d supply, fill #3
  Filled 2023-11-23: qty 30, 30d supply, fill #4
  Filled 2023-12-21: qty 30, 30d supply, fill #5

## 2023-07-26 ENCOUNTER — Encounter: Payer: Self-pay | Admitting: Dermatology

## 2023-07-26 ENCOUNTER — Ambulatory Visit (INDEPENDENT_AMBULATORY_CARE_PROVIDER_SITE_OTHER): Payer: 59 | Admitting: Dermatology

## 2023-07-26 VITALS — BP 114/74 | HR 75

## 2023-07-26 DIAGNOSIS — L918 Other hypertrophic disorders of the skin: Secondary | ICD-10-CM | POA: Diagnosis not present

## 2023-07-26 DIAGNOSIS — L720 Epidermal cyst: Secondary | ICD-10-CM

## 2023-07-26 DIAGNOSIS — L723 Sebaceous cyst: Secondary | ICD-10-CM

## 2023-07-26 NOTE — Patient Instructions (Addendum)
Hello Malayiah,  Thank you for visiting [place] today. We appreciate your commitment to maintaining your health and addressing your dermatological concerns. Here is a summary of the key instructions and recommendations from today's consultation:  - Skin Tag Treatment:   - We treated the skin tag under your right arm using liquid nitrogen. It should fall off within a week. There are no restrictions on activity; adjust according to your comfort.  - Forehead Cyst Treatment:   - We performed a minor procedure to relieve the pressure from the cyst on your forehead. This involved nicking and draining the cyst. It may refill, and if it does, we can consider a steroid injection as a next step.   - A spot bandage has been applied; you may experience a slight bruise.  - Follow-Up Care:   - Please monitor the treated areas for any changes. If the cyst refills, contact us for a possible follow-up treatment.   - Schedule a skin cancer screening for early fall. It is recommended to have this screening annually, especially considering your fair skin and previous sun exposure.  - General Advice:   - Continue to monitor any skin changes and consult with [place] for any concerns. We are here to assist you with any dermatological needs.  We hope the treatments provide relief and contribute positively to your health. Please do not hesitate to reach out if you have any further questions or need additional care.  Warm regards,  Dr. [facility]      Cryotherapy Aftercare  Wash gently with soap and water everyday.   Apply Vaseline and Band-Aid daily until healed.     Due to recent changes in healthcare laws, you may see results of your pathology and/or laboratory studies on MyChart before the doctors have had a chance to review them. We understand that in some cases there may be results that are confusing or concerning to you. Please understand that not all results are received at the same time and  often the doctors may need to interpret multiple results in order to provide you with the best plan of care or course of treatment. Therefore, we ask that you please give [place] 2 business days to thoroughly review all your results before contacting the office for clarification. Should we see a critical lab result, you will be contacted sooner.   If You Need Anything After Your Visit  If you have any questions or concerns for your doctor, please call our main line at [phone] If no one answers, please leave a voicemail as directed and we will return your call as soon as possible. Messages left after 4 pm will be answered the following business day.   You may also send [place] a message via MyChart. We typically respond to MyChart messages within 1-2 business days.  For prescription refills, please ask your pharmacy to contact our office. Our fax number is [phone].  If you have an urgent issue when the clinic is closed that cannot wait until the next business day, you can page your doctor at the number below.    Please note that while we do our best to be available for urgent issues outside of office hours, we are not available 24/7.   If you have an urgent issue and are unable to reach [place], you may choose to seek medical care at your doctor's office, retail clinic, urgent care center, or emergency room.  If you have a medical emergency, please immediately call 911  or go to the emergency department. In the event of inclement weather, please call our main line at [phone] for an update on the status of any delays or closures.  Dermatology Medication Tips: Please keep the boxes that topical medications come in in order to help keep track of the instructions about where and how to use these. Pharmacies typically print the medication instructions only on the boxes and not directly on the medication tubes.   If your medication is too expensive, please contact our office at [phone] or send [place]  a message through MyChart.   We are unable to tell what your co-pay for medications will be in advance as this is different depending on your insurance coverage. However, we may be able to find a substitute medication at lower cost or fill out paperwork to get insurance to cover a needed medication.   If a prior authorization is required to get your medication covered by your insurance company, please allow [place] 1-2 business days to complete this process.  Drug prices often vary depending on where the prescription is filled and some pharmacies may offer cheaper prices.  The website www.goodrx.com contains coupons for medications through different pharmacies. The prices here do not account for what the cost may be with help from insurance (it may be cheaper with your insurance), but the website can give you the price if you did not use any insurance.  - You can print the associated coupon and take it with your prescription to the pharmacy.  - You may also stop by our office during regular business hours and pick up a GoodRx coupon card.  - If you need your prescription sent electronically to a different pharmacy, notify our office through [facility] or by phone at [phone]

## 2023-07-26 NOTE — Progress Notes (Unsigned)
New Patient Visit   Subjective  Donna Tucker is a 37 y.o. female who presents for the following: bump on face, skin tag  Patient states she has bump located on the forehead that she would like to have examined. Patient reports the areas have been there for around 10 months. She reports the area is bothersome. She states that the area started off looking like a pimple but changed in appearance and it can be tender when she sleeps on it. Patient reports has not previously been treated for these areas. She has tried vitamin E on the spot to try to resolve it but it didn't help. She has not tried anything else for it. Pt also has a skin tag under her right axilla for several years. Spot is bothersome, but no significant change in size or appearance. She has not previously had spot treated by another physician. Patient has no Hx of skin cancer. Patient has family history of skin cancer(s).    The following portions of the chart were reviewed this encounter and updated as appropriate: medications, allergies, medical history  Review of Systems:  No other skin or systemic complaints except as noted in HPI or Assessment and Plan.  Objective  Well appearing patient in no apparent distress; mood and affect are within normal limits.  A focused examination was performed of the following areas: Face and right axilla  Relevant exam findings are noted in the Assessment and Plan.  Exam: Subcutaneous nodule at forehad  Exam: pedunculated macule at right axilla            Assessment & Plan   INFLAMED EPIDERMAL INCLUSION CYST  Benign-appearing. Exam most consistent with an epidermal inclusion cyst. Discussed that a cyst is a benign growth that can grow over time and sometimes get irritated or inflamed.  Discussed option of surgical excision to remove it if it is growing, symptomatic, or other changes noted.  Please call for new or changing lesions so they can be evaluated.  SKIN TAGS  (Acrochordons) - Erythematous fleshy, skin-colored pedunculated papules - Benign appearing.  - Symptomatic, irritating, patient would like treated. - Prior to procedure, discussed risks of blister formation, small wound, skin dyspigmentation, or rare scar following treatment. Recommend Vaseline ointment to treated areas while healing. - Discussed risks (permanent scarring, infection, pain, bleeding, bruising, redness, and recurrence of the lesion) and benefits of the procedure, as well as the alternatives.  She is aware that skin tags are benign lesions, and their removal is often not considered medically necessary.  Their insurance may not cover the procedure. We recommend all patients call their insurance company to confirm whether the procedure is covered before having it done. Informed consent was obtained.  Destruction Procedure Note Destruction method: cryotherapy   Informed consent: discussed and consent obtained   Lesion destroyed using liquid nitrogen: Yes   Outcome: patient tolerated procedure well with no complications   Post-procedure details: wound care instructions given   Locations: right axilla  Assessment & Plan Assessment & Plan   Inflamed epidermoid cyst of skin Head - Anterior (Face)  Acne/Milia surgery - Head - Anterior (Face) Location: mid forehead  Informed Consent: Discussed risks (permanent scarring, light or dark discoloration, infection, pain, bleeding, bruising, redness, damage to adjacent structures, and recurrence of the lesion) and benefits of the procedure, as well as the alternatives.  Informed consent was obtained.  Preparation: The area was prepped with alcohol.  Anesthesia: Lidocaine 1% with epinephrine  Procedure Details: An  incision was made overlying the lesion. The lesion drained pus and cyst material. The area was flushed with sterile saline. Ointment and a sterile pressure dressing were applied. The patient tolerated procedure well.  Total  number of lesions drained: 1  Plan: The patient was instructed on post-op care. Recommend OTC analgesia as needed for pain.   Skin tag Right Upper Arm - Anterior  Destruction of lesion - Right Upper Arm - Anterior Complexity: simple   Destruction method: cryotherapy   Informed consent: discussed and consent obtained   Timeout:  patient name, date of birth, surgical site, and procedure verified Lesion destroyed using liquid nitrogen: Yes   Region frozen until ice ball extended beyond lesion: Yes   Outcome: patient tolerated procedure well with no complications   Post-procedure details: wound care instructions given     Pt was advised that although cyst was drained, it may refill and that if it does, we may need to consider doing a steroid injection. She was also informed she could get bruising from today's procedure.  Pt advised that the skin tag should fall off within a week and there are not any restrictions to activity.   Recommended to pt to have a TBSE in the fall as she has fair skin and has had previous sun exposure. She was told we will reevaluate both spots treated today at that visit.      No follow-ups on file.  Owens Shark, CMA, am acting as scribe for [employer], DO.   Documentation: I have reviewed the above documentation for accuracy and completeness, and I agree with the above.  Langston Reusing, DO

## 2023-08-25 ENCOUNTER — Other Ambulatory Visit (HOSPITAL_COMMUNITY): Payer: Self-pay

## 2023-09-02 ENCOUNTER — Encounter: Payer: Self-pay | Admitting: Family Medicine

## 2023-09-02 ENCOUNTER — Ambulatory Visit: Payer: 59 | Admitting: Family Medicine

## 2023-09-02 VITALS — BP 130/85 | HR 66 | Temp 98.1°F | Resp 18 | Ht 64.5 in | Wt 254.8 lb

## 2023-09-02 DIAGNOSIS — E782 Mixed hyperlipidemia: Secondary | ICD-10-CM

## 2023-09-02 DIAGNOSIS — Z23 Encounter for immunization: Secondary | ICD-10-CM | POA: Diagnosis not present

## 2023-09-02 NOTE — Progress Notes (Signed)
Established Patient Office Visit  Subjective   Patient ID: Donna Tucker, female    DOB: February 22, 1986  Age: 37 y.o. MRN: [id number]  Chief Complaint  Patient presents with   Medical Management of Chronic Issues    Patient is here for 6 month follow up with labs    HPI  Pt is here for 6 month follow up. She has been working on diet and exercise. She had annual physical done in March and had elevated cholesterol. She was advised on diet and exercise. She has lost 17 lbs since March. She has been trying vegan meals. Pt would like flu and covid vaccine today.   Review of Systems  All other systems reviewed and are negative.     Objective:     BP 130/85   Pulse 66   Temp 98.1 F (36.7 C) (Oral)   Resp 18   Ht 5' 4.5" (1.638 m)   Wt 254 lb 12.8 oz (115.6 kg)   SpO2 96%   BMI 43.06 kg/m  BP Readings from Last 3 Encounters:  09/02/23 130/85  07/26/23 114/74  03/02/23 128/85      Physical Exam Vitals and nursing note reviewed.  Constitutional:      Appearance: Normal appearance. She is normal weight.  HENT:     Head: Normocephalic and atraumatic.     Right Ear: External ear normal.     Left Ear: External ear normal.     Nose: Nose normal.     Mouth/Throat:     Mouth: Mucous membranes are moist.     Pharynx: Oropharynx is clear.  Eyes:     Conjunctiva/sclera: Conjunctivae normal.     Pupils: Pupils are equal, round, and reactive to light.  Cardiovascular:     Rate and Rhythm: Normal rate.  Pulmonary:     Effort: Pulmonary effort is normal.  Skin:    General: Skin is warm.     Capillary Refill: Capillary refill takes less than 2 seconds.  Neurological:     General: No focal deficit present.     Mental Status: She is alert and oriented to person, place, and time. Mental status is at baseline.  Psychiatric:        Mood and Affect: Mood normal.        Behavior: Behavior normal.        Thought Content: Thought content normal.        Judgment: Judgment  normal.    No results found for any visits on 09/02/23.  Last lipids Lab Results  Component Value Date   CHOL 312 (H) 03/02/2023   HDL 48 03/02/2023   LDLCALC 224 (H) 03/02/2023   TRIG 201 (H) 03/02/2023   CHOLHDL 6.5 (H) 03/02/2023      The ASCVD Risk score (Arnett DK, et al., 2019) failed to calculate for the following reasons:   The 2019 ASCVD risk score is only valid for ages 67 to 47    Assessment & Plan:   Problem List Items Addressed This Visit   None  Mixed hyperlipidemia -     Lipid panel  Need for influenza vaccination -     Flu vaccine trivalent PF, 6mos and older(Flulaval,Afluria,Fluarix,Fluzone)  Need for COVID-19 vaccine -     PFIZER Comirnaty(GRAY TOP)COVID-19 Vaccine   To return when fasting for cholesterol recheck Flu and covid vaccine today Job well done on exercise and dieting.  No follow-ups on file.    Suzan Slick, MD

## 2023-09-09 DIAGNOSIS — E782 Mixed hyperlipidemia: Secondary | ICD-10-CM | POA: Diagnosis not present

## 2023-09-10 LAB — LIPID PANEL
Chol/HDL Ratio: 6.6 {ratio} — ABNORMAL HIGH (ref 0.0–4.4)
Cholesterol, Total: 224 mg/dL — ABNORMAL HIGH (ref 100–199)
HDL: 34 mg/dL — ABNORMAL LOW (ref >39)
LDL Chol Calc (NIH): 161 mg/dL — ABNORMAL HIGH (ref 0–99)
Triglycerides: 156 mg/dL — ABNORMAL HIGH (ref 0–149)
VLDL Cholesterol Cal: 29 mg/dL (ref 5–40)

## 2023-09-26 ENCOUNTER — Encounter: Payer: Self-pay | Admitting: Dermatology

## 2023-09-26 ENCOUNTER — Ambulatory Visit: Payer: 59 | Admitting: Dermatology

## 2023-09-26 VITALS — BP 124/82

## 2023-09-26 DIAGNOSIS — Z1283 Encounter for screening for malignant neoplasm of skin: Secondary | ICD-10-CM

## 2023-09-26 DIAGNOSIS — D1801 Hemangioma of skin and subcutaneous tissue: Secondary | ICD-10-CM | POA: Diagnosis not present

## 2023-09-26 DIAGNOSIS — L578 Other skin changes due to chronic exposure to nonionizing radiation: Secondary | ICD-10-CM | POA: Diagnosis not present

## 2023-09-26 DIAGNOSIS — D229 Melanocytic nevi, unspecified: Secondary | ICD-10-CM

## 2023-09-26 DIAGNOSIS — L821 Other seborrheic keratosis: Secondary | ICD-10-CM | POA: Diagnosis not present

## 2023-09-26 DIAGNOSIS — L814 Other melanin hyperpigmentation: Secondary | ICD-10-CM | POA: Diagnosis not present

## 2023-09-26 DIAGNOSIS — W908XXA Exposure to other nonionizing radiation, initial encounter: Secondary | ICD-10-CM | POA: Diagnosis not present

## 2023-09-26 NOTE — Patient Instructions (Signed)
Important Information  Due to recent changes in healthcare laws, you may see results of your pathology and/or laboratory studies on MyChart before the doctors have had a chance to review them. We understand that in some cases there may be results that are confusing or concerning to you. Please understand that not all results are received at the same time and often the doctors may need to interpret multiple results in order to provide you with the best plan of care or course of treatment. Therefore, we ask that you please give [place] 2 business days to thoroughly review all your results before contacting the office for clarification. Should we see a critical lab result, you will be contacted sooner.   If You Need Anything After Your Visit  If you have any questions or concerns for your doctor, please call our main line at [phone] If no one answers, please leave a voicemail as directed and we will return your call as soon as possible. Messages left after 4 pm will be answered the following business day.   You may also send [place] a message via MyChart. We typically respond to MyChart messages within 1-2 business days.  For prescription refills, please ask your pharmacy to contact our office. Our fax number is [phone].  If you have an urgent issue when the clinic is closed that cannot wait until the next business day, you can page your doctor at the number below.    Please note that while we do our best to be available for urgent issues outside of office hours, we are not available 24/7.   If you have an urgent issue and are unable to reach [place], you may choose to seek medical care at your doctor's office, retail clinic, urgent care center, or emergency room.  If you have a medical emergency, please immediately call 911 or go to the emergency department. In the event of inclement weather, please call our main line at [phone] for an update on the status of any delays or  closures.  Dermatology Medication Tips: Please keep the boxes that topical medications come in in order to help keep track of the instructions about where and how to use these. Pharmacies typically print the medication instructions only on the boxes and not directly on the medication tubes.   If your medication is too expensive, please contact our office at [phone] or send [place] a message through MyChart.   We are unable to tell what your co-pay for medications will be in advance as this is different depending on your insurance coverage. However, we may be able to find a substitute medication at lower cost or fill out paperwork to get insurance to cover a needed medication.   If a prior authorization is required to get your medication covered by your insurance company, please allow [place] 1-2 business days to complete this process.  Drug prices often vary depending on where the prescription is filled and some pharmacies may offer cheaper prices.  The website www.goodrx.com contains coupons for medications through different pharmacies. The prices here do not account for what the cost may be with help from insurance (it may be cheaper with your insurance), but the website can give you the price if you did not use any insurance.  - You can print the associated coupon and take it with your prescription to the pharmacy.  - You may also stop by our office during regular business hours and pick up a GoodRx coupon card.  - If  you need your prescription sent electronically to a different pharmacy, notify our office through [facility] or by phone at [phone]

## 2023-09-26 NOTE — Progress Notes (Signed)
   Follow-Up Visit   Subjective  Donna Tucker is a 37 y.o. female who presents for the following: Skin Cancer Screening and Full Body Skin Exam - no personal or close family history of skin cancer (skin cancer in cousins). She had a cyst of her forehead treated with I&D at her last appointment but it is still there.  The patient presents for Total-Body Skin Exam (TBSE) for skin cancer screening and mole check. The patient has spots, moles and lesions to be evaluated, some may be new or changing and the patient may have concern these could be cancer.    The following portions of the chart were reviewed this encounter and updated as appropriate: medications, allergies, medical history  Review of Systems:  No other skin or systemic complaints except as noted in HPI or Assessment and Plan.  Objective  Well appearing patient in no apparent distress; mood and affect are within normal limits.  A full examination was performed including scalp, head, eyes, ears, nose, lips, neck, chest, axillae, abdomen, back, buttocks, bilateral upper extremities, bilateral lower extremities, hands, feet, fingers, toes, fingernails, and toenails. All findings within normal limits unless otherwise noted below.   Relevant physical exam findings are noted in the Assessment and Plan.  Mid Forehead Red papule overlying a cystic papule         Assessment & Plan   SKIN CANCER SCREENING PERFORMED TODAY.  ACTINIC DAMAGE - Chronic condition, secondary to cumulative UV/sun exposure - diffuse scaly erythematous macules with underlying dyspigmentation - Recommend daily broad spectrum sunscreen SPF 30+ to sun-exposed areas, reapply every 2 hours as needed.  - Staying in the shade or wearing long sleeves, sun glasses (UVA+UVB protection) and wide brim hats (4-inch brim around the entire circumference of the hat) are also recommended for sun protection.  - Call for new or changing lesions.  LENTIGINES,  SEBORRHEIC KERATOSES, HEMANGIOMAS - Benign normal skin lesions - Benign-appearing - Call for any changes  MELANOCYTIC NEVI - Tan-brown and/or pink-flesh-colored symmetric macules and papules - Benign appearing on exam today - Observation - Call clinic for new or changing moles - Recommend daily use of broad spectrum spf 30+ sunscreen to sun-exposed areas.    Hemangioma of skin Mid Forehead  Hemangioma overlying a cyst  ED to hemangioma today. Discussed excision of cyst - patient may consider in the future.   Destruction of lesion - Mid Forehead Complexity: simple   Destruction method comment:  Electrodesiccation Informed consent: discussed and consent obtained   Timeout:  patient name, date of birth, surgical site, and procedure verified Patient was prepped and draped in usual sterile fashion: patient was prepped with isopropyl alcohol. Outcome: patient tolerated procedure well with no complications     Return in about 1 year (around 09/25/2024) for TBSE.  I, Joanie Coddington, CMA, am acting as scribe for [employer], DO .   Documentation: I have reviewed the above documentation for accuracy and completeness, and I agree with the above.  Langston Reusing, DO

## 2023-10-14 ENCOUNTER — Other Ambulatory Visit (HOSPITAL_COMMUNITY): Payer: Self-pay

## 2023-10-25 ENCOUNTER — Other Ambulatory Visit (HOSPITAL_COMMUNITY): Payer: Self-pay

## 2023-11-24 ENCOUNTER — Other Ambulatory Visit (HOSPITAL_COMMUNITY): Payer: Self-pay

## 2023-11-27 ENCOUNTER — Telehealth: Payer: 59 | Admitting: Family

## 2023-11-27 DIAGNOSIS — J019 Acute sinusitis, unspecified: Secondary | ICD-10-CM

## 2023-11-27 MED ORDER — AMOXICILLIN-POT CLAVULANATE 875-125 MG PO TABS
1.0000 | ORAL_TABLET | Freq: Two times a day (BID) | ORAL | 0 refills | Status: DC
Start: 2023-11-27 — End: 2024-01-18

## 2023-11-27 NOTE — Progress Notes (Signed)
E-Visit for Sinus Problems  We are sorry that you are not feeling well.  Here is how we plan to help!  Based on what you have shared with me it looks like you have sinusitis.  Sinusitis is inflammation and infection in the sinus cavities of the head.  Based on your presentation I believe you most likely have Acute Bacterial Sinusitis.  This is an infection caused by bacteria and is treated with antibiotics. I have prescribed Augmentin 875mg/125mg one tablet twice daily with food, for 7 days. You may use an oral decongestant such as Mucinex D or if you have glaucoma or high blood pressure use plain Mucinex. Saline nasal spray help and can safely be used as often as needed for congestion.  If you develop worsening sinus pain, fever or notice severe headache and vision changes, or if symptoms are not better after completion of antibiotic, please schedule an appointment with a health care provider.    Sinus infections are not as easily transmitted as other respiratory infection, however we still recommend that you avoid close contact with loved ones, especially the very young and elderly.  Remember to wash your hands thoroughly throughout the day as this is the number one way to prevent the spread of infection!  Home Care: Only take medications as instructed by your medical team. Complete the entire course of an antibiotic. Do not take these medications with alcohol. A steam or ultrasonic humidifier can help congestion.  You can place a towel over your head and breathe in the steam from hot water coming from a faucet. Avoid close contacts especially the very young and the elderly. Cover your mouth when you cough or sneeze. Always remember to wash your hands.  Get Help Right Away If: You develop worsening fever or sinus pain. You develop a severe head ache or visual changes. Your symptoms persist after you have completed your treatment plan.  Make sure you Understand these instructions. Will watch  your condition. Will get help right away if you are not doing well or get worse.  Thank you for choosing an e-visit.  Your e-visit answers were reviewed by a board certified advanced clinical practitioner to complete your personal care plan. Depending upon the condition, your plan could have included both over the counter or prescription medications.  Please review your pharmacy choice. Make sure the pharmacy is open so you can pick up prescription now. If there is a problem, you may contact your provider through MyChart messaging and have the prescription routed to another pharmacy.  Your safety is important to us. If you have drug allergies check your prescription carefully.   For the next 24 hours you can use MyChart to ask questions about today's visit, request a non-urgent call back, or ask for a work or school excuse. You will get an email in the next two days asking about your experience. I hope that your e-visit has been valuable and will speed your recovery.   Approximately 5 minutes was spent documenting and reviewing patient's chart.   

## 2023-12-08 ENCOUNTER — Other Ambulatory Visit: Payer: Self-pay | Admitting: Family Medicine

## 2023-12-08 DIAGNOSIS — G4709 Other insomnia: Secondary | ICD-10-CM

## 2023-12-09 ENCOUNTER — Other Ambulatory Visit (HOSPITAL_COMMUNITY): Payer: Self-pay

## 2023-12-09 MED ORDER — TRAZODONE HCL 50 MG PO TABS
25.0000 mg | ORAL_TABLET | Freq: Every evening | ORAL | 1 refills | Status: DC | PRN
Start: 2023-12-09 — End: 2024-08-20
  Filled 2023-12-09 – 2024-02-08 (×2): qty 45, 90d supply, fill #0
  Filled 2024-05-20: qty 45, 90d supply, fill #1

## 2023-12-10 ENCOUNTER — Other Ambulatory Visit (HOSPITAL_COMMUNITY): Payer: Self-pay

## 2023-12-13 ENCOUNTER — Other Ambulatory Visit (HOSPITAL_COMMUNITY): Payer: Self-pay

## 2023-12-21 ENCOUNTER — Other Ambulatory Visit: Payer: Self-pay | Admitting: Family Medicine

## 2023-12-21 DIAGNOSIS — F419 Anxiety disorder, unspecified: Secondary | ICD-10-CM

## 2023-12-22 ENCOUNTER — Other Ambulatory Visit: Payer: Self-pay

## 2023-12-23 ENCOUNTER — Other Ambulatory Visit (HOSPITAL_COMMUNITY): Payer: Self-pay

## 2023-12-23 MED ORDER — SERTRALINE HCL 25 MG PO TABS
25.0000 mg | ORAL_TABLET | Freq: Every day | ORAL | 0 refills | Status: DC
  Filled 2023-12-23: qty 30, 30d supply, fill #0

## 2024-01-18 ENCOUNTER — Ambulatory Visit: Payer: 59 | Admitting: Plastic Surgery

## 2024-01-18 ENCOUNTER — Encounter: Payer: Self-pay | Admitting: Plastic Surgery

## 2024-01-18 VITALS — BP 123/87 | HR 83 | Ht 64.0 in | Wt 255.4 lb

## 2024-01-18 DIAGNOSIS — L989 Disorder of the skin and subcutaneous tissue, unspecified: Secondary | ICD-10-CM | POA: Diagnosis not present

## 2024-01-18 NOTE — Progress Notes (Signed)
 Referring Provider Colette Torrence GRADE, MD [address] [place],  [place] [address]   CC:  Chief Complaint  Patient presents with   Advice Only      Donna Tucker is an 38 y.o. female.  HPI: Patient referred for evaluation of a lesion on the forehead.  She states that this has been there for over 1 year.  The lesion has been treated twice unsuccessfully.  This is included 1 episode of electrodesiccation.  There is thought to be a hemangioma.  The patient would like to have it removed.  Allergies  Allergen Reactions   Nsaids Anaphylaxis, Hives and Swelling     all nsaids Other reaction(s): Unknown   Salicylates Anaphylaxis, Hives and Swelling   Gluten Meal    Lactose Intolerance (Gi)    Oxycodone  Hives   Wheat Hives     all wheat products    Outpatient Encounter Medications as of 01/18/2024  Medication Sig   hydrochlorothiazide  (HYDRODIURIL ) 25 MG tablet Take 1 tablet by mouth daily.   levothyroxine  (SYNTHROID ) 125 MCG tablet Take 1 tablet by mouth in the morning on an empty stomach.   sertraline  (ZOLOFT ) 25 MG tablet Take 1 tablet (25 mg total) by mouth daily.   traZODone  (DESYREL ) 50 MG tablet Take 1/2 tablet (25 mg total) by mouth at bedtime as needed for sleep.   VITAMIN D , CHOLECALCIFEROL, PO Take 1 tablet by mouth at bedtime.   [DISCONTINUED] amoxicillin -clavulanate (AUGMENTIN ) 875-125 MG tablet Take 1 tablet by mouth 2 (two) times daily.   [DISCONTINUED] Calcium  Carbonate (CALCIUM  500 PO) Take 1 tablet by mouth at bedtime. (Patient not taking: Reported on 01/18/2024)   [DISCONTINUED] oxymetazoline (AFRIN) 0.05 % nasal spray Place 1 spray into both nostrils 2 (two) times daily.   No facility-administered encounter medications on file as of 01/18/2024.     Past Medical History:  Diagnosis Date   Anxiety    Blood type O+    from records review   BMI 39.0-39.9,adult 07/24/2020   Complication of anesthesia    post partum itching   Depression    Elevated  blood-pressure reading without diagnosis of hypertension 07/24/2020   Gestational hypertension 10/01/2014   Gluten intolerance    Graves' disease    endocrinologist--- dr tommas-- dx 2012   Hx of pre-eclampsia in prior pregnancy, currently pregnant    Hx of varicella    Hypothyroidism    Obesity 08/14/2013   Postpartum care following cesarean delivery (1/31) 01/14/2020   Pregnancy induced hypertension    Previous cesarean delivery affecting pregnancy 01/13/2020   Skin sensation disturbance 06/01/2021   Tachycardia    Wears glasses    Wheat allergy     Past Surgical History:  Procedure Laterality Date   CESAREAN SECTION N/A 10/02/2014   Procedure: CESAREAN SECTION;  Surgeon: Charlie JINNY Flowers, MD;  Location: [facility];  Service: Obstetrics;  Laterality: N/A;   CESAREAN SECTION N/A 01/13/2020   Procedure: Repeat CESAREAN SECTION;  Surgeon: Flowers Charlie, MD;  Location: [facility];  Service: Obstetrics;  Laterality: N/A;  EDD: 02/03/20   THYROIDECTOMY N/A 11/02/2018   Procedure: TOTAL THYROIDECTOMY;  Surgeon: Eletha Boas, MD;  Location: [facility];  Service: General;  Laterality: N/A;    Family History  Problem Relation Age of Onset   Diabetes Mother        T1DM   Stroke Mother    Hypothyroidism Mother    CVA Mother    Hypertension Father    Depression  Brother    Hypothyroidism Brother    Diabetes Maternal Aunt    Graves' disease Maternal Aunt    Cancer Maternal Aunt        x 4 aunts breast cancer -   Heart attack Maternal Aunt    Cancer Paternal Aunt        breast, pancreatic   CVA Maternal Grandmother    Cancer Maternal Grandmother        breast   Graves' disease Maternal Grandfather    CVA Paternal Grandmother    Alzheimer's disease Paternal Grandmother    Hyperlipidemia Paternal Grandfather    Hypertension Paternal Grandfather    Cancer Cousin        breast   Graves' disease Cousin    Breast cancer Cousin        breast cancer in 3 cousins   Cancer Other        Aunt-  colon, Aunt-liver    Social History   Social History Narrative   Not on file     Review of Systems General: Denies fevers, chills, weight loss CV: Denies chest pain, shortness of breath, palpitations Skin: A 7 mm x 7 mm raised lesion in the center portion of the forehead without pain or drainage.  Physical Exam    01/18/2024    1:05 PM 09/26/2023    8:59 AM 09/02/2023    9:17 AM  Vitals with BMI  Height 5' 4  5' 4.5  Weight 255 lbs 6 oz  254 lbs 13 oz  BMI 43.82  43.08  Systolic 123 124 869  Diastolic 87 82 85  Pulse 83  66    General:  No acute distress,  Alert and oriented, Non-Toxic, Normal speech and affect Integument: Patient has a 7 mm x 7 mm compressible lesion in the central portion of the forehead.  There is some scarring around the lesion where she has had previous procedures.  It is not painful to palpation.  It does seem to blanch with compression however this is difficult to tell due to the scarring around it. Mammogram: Not applicable Assessment/Plan Skin lesion central forehead: This is most likely a hemangioma.  The patient would like to have it removed.  Given the fact that she has had this treated in office twice with no improvement I would like to treat it in the operating room to make sure that any feeding vessels can be adequately controlled.  I discussed this at length with her and she understands and is in agreement.  She understands that she will have a small scar and I have showed her where I expect the scar to be placed.  I believe it can be hidden in one of her forehead lines.  All questions were answered.  Photographs were obtained today with her consent.  Will submit for excision in the operating room at her request.  Leonce KATHEE [place] 01/18/2024, 1:45 PM

## 2024-01-22 ENCOUNTER — Other Ambulatory Visit (HOSPITAL_BASED_OUTPATIENT_CLINIC_OR_DEPARTMENT_OTHER): Payer: Self-pay | Admitting: Family Medicine

## 2024-01-22 ENCOUNTER — Other Ambulatory Visit: Payer: Self-pay | Admitting: Family Medicine

## 2024-01-22 DIAGNOSIS — F419 Anxiety disorder, unspecified: Secondary | ICD-10-CM

## 2024-01-22 DIAGNOSIS — I10 Essential (primary) hypertension: Secondary | ICD-10-CM

## 2024-01-23 ENCOUNTER — Other Ambulatory Visit (HOSPITAL_COMMUNITY): Payer: Self-pay

## 2024-01-23 ENCOUNTER — Other Ambulatory Visit: Payer: Self-pay

## 2024-01-23 MED ORDER — SERTRALINE HCL 25 MG PO TABS
25.0000 mg | ORAL_TABLET | Freq: Every day | ORAL | 0 refills | Status: DC
  Filled 2024-01-23: qty 90, 90d supply, fill #0

## 2024-01-23 MED ORDER — HYDROCHLOROTHIAZIDE 25 MG PO TABS
25.0000 mg | ORAL_TABLET | Freq: Every day | ORAL | 5 refills | Status: DC
  Filled 2024-01-23: qty 30, 30d supply, fill #0
  Filled 2024-02-20: qty 30, 30d supply, fill #1
  Filled 2024-03-18: qty 30, 30d supply, fill #2
  Filled 2024-04-17: qty 30, 30d supply, fill #3
  Filled 2024-05-16: qty 30, 30d supply, fill #4
  Filled 2024-06-13: qty 30, 30d supply, fill #5

## 2024-01-27 ENCOUNTER — Telehealth: Payer: Self-pay | Admitting: Student

## 2024-01-27 NOTE — Telephone Encounter (Signed)
Called and left a VM.

## 2024-01-30 ENCOUNTER — Ambulatory Visit (INDEPENDENT_AMBULATORY_CARE_PROVIDER_SITE_OTHER): Payer: 59 | Admitting: Student

## 2024-01-30 ENCOUNTER — Other Ambulatory Visit (HOSPITAL_COMMUNITY): Payer: Self-pay

## 2024-01-30 VITALS — BP 116/79 | HR 84

## 2024-01-30 DIAGNOSIS — L989 Disorder of the skin and subcutaneous tissue, unspecified: Secondary | ICD-10-CM

## 2024-01-30 MED ORDER — ONDANSETRON HCL 4 MG PO TABS
4.0000 mg | ORAL_TABLET | Freq: Three times a day (TID) | ORAL | 0 refills | Status: DC | PRN
  Filled 2024-01-30: qty 10, 4d supply, fill #0

## 2024-01-30 NOTE — H&P (View-Only) (Signed)
 Patient ID: Donna Tucker, female    DOB: 05-Jul-1986, 38 y.o.   MRN: [id number]  Chief Complaint  Patient presents with   pre op      ICD-10-CM   1. Skin lesion  L98.9        History of Present Illness: Donna Tucker is a 38 y.o.  female  with a history of forehead lesion.  She presents for preoperative evaluation for upcoming procedure, excision of forehead lesion, scheduled for 02/17/24 with Dr. Ladona Ridgel.  Patient states that when she had general anesthesia with her thyroidectomy, she had no problems.  She did state that she had issues with her first C-section and a medication from the anesthesia caused itching.  She states that for her second C-section, she made the anesthesiologist aware of this issue and she did not have any issues with the second C-section.  Patient denies any history of cardiac disease.  She denies taking any blood thinners.  Patient reports she is not a smoker.  Patient denies taking any birth control or hormone replacement.  She states that she has had several miscarriages, which she attributes to her Graves' disease.  She denies any personal history of blood clots.  She does report her mom had an ischemic stroke and a TIA.  She denies any history of clotting diseases.  She denies any recent surgeries, traumas, infections.  She denies any history of stroke or heart attack.  She denies any history of Crohn's disease or ulcerative colitis, COPD or asthma.  She denies any history of cancer.  She denies any varicosities to her lower extremities.  Summary of Previous Visit: Patient was seen for initial consult by Dr. Ladona Ridgel in 01/18/2024.  At this visit, patient reported she had a forehead lesion that had been there for over a year.  She stated that the lesion had been treated twice unsuccessfully.  Patient was requesting removal of the lesion.  The lesion was most likely a hemangioma.  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  PMH Significant for: Hypertension, hypothyroidism, anxiety   Past Medical History: Allergies: Allergies  Allergen Reactions   Nsaids Anaphylaxis, Hives and Swelling    " all nsaids" Other reaction(s): Unknown   Salicylates Anaphylaxis, Hives and Swelling   Gluten Meal    Lactose Intolerance (Gi)    Oxycodone Hives   Wheat Hives    " all wheat products"    Current Medications:  Current Outpatient Medications:    hydrochlorothiazide (HYDRODIURIL) 25 MG tablet, Take 1 tablet by mouth daily., Disp: 30 tablet, Rfl: 5   levothyroxine (SYNTHROID) 125 MCG tablet, Take 1 tablet by mouth in the morning on an empty stomach., Disp: 90 tablet, Rfl: 3   ondansetron (ZOFRAN) 4 MG tablet, Take 1 tablet (4 mg total) by mouth every 8 (eight) hours as needed for up to 10 doses for nausea or vomiting., Disp: 10 tablet, Rfl: 0   sertraline (ZOLOFT) 25 MG tablet, Take 1 tablet (25 mg total) by mouth daily., Disp: 90 tablet, Rfl: 0   traZODone (DESYREL) 50 MG tablet, Take 1/2 tablet (25 mg total) by mouth at bedtime as needed for sleep., Disp: 45 tablet, Rfl: 1   VITAMIN D, CHOLECALCIFEROL, PO, Take 1 tablet by mouth at bedtime., Disp: , Rfl:   Past Medical Problems: Past Medical History:  Diagnosis Date   Anxiety    Blood type O+  from records review   BMI 39.0-39.9,adult 07/24/2020   Complication of anesthesia    post partum itching   Depression    Elevated blood-pressure reading without diagnosis of hypertension 07/24/2020   Gestational hypertension 10/01/2014   Gluten intolerance    Graves' disease    endocrinologist--- dr Talmage Nap-- dx 2012   Hx of pre-eclampsia in prior pregnancy, currently pregnant    Hx of varicella    Hypothyroidism    Obesity 08/14/2013   Postpartum care following cesarean delivery (1/31) 01/14/2020   Pregnancy induced hypertension    Previous cesarean delivery affecting pregnancy 01/13/2020   Skin sensation disturbance 06/01/2021   Tachycardia     Wears glasses    Wheat allergy     Past Surgical History: Past Surgical History:  Procedure Laterality Date   CESAREAN SECTION N/A 10/02/2014   Procedure: CESAREAN SECTION;  Surgeon: Lenoard Aden, MD;  Location: [facility];  Service: Obstetrics;  Laterality: N/A;   CESAREAN SECTION N/A 01/13/2020   Procedure: Repeat CESAREAN SECTION;  Surgeon: Olivia Mackie, MD;  Location: [facility];  Service: Obstetrics;  Laterality: N/A;  EDD: 02/03/20   THYROIDECTOMY N/A 11/02/2018   Procedure: TOTAL THYROIDECTOMY;  Surgeon: Darnell Level, MD;  Location: [facility];  Service: General;  Laterality: N/A;    Social History: Social History   Socioeconomic History   Marital status: Married    Spouse name: Metallurgist   Number of children: 3   Years of education: Not on file   Highest education level: Master's degree (e.g., MA, MS, MEng, MEd, MSW, MBA)  Occupational History   Not on file  Tobacco Use   Smoking status: Never   Smokeless tobacco: Never  Vaping Use   Vaping status: Never Used  Substance and Sexual Activity   Alcohol use: Yes    Alcohol/week: 3.0 standard drinks of alcohol    Types: 3 Glasses of wine per week   Drug use: Never   Sexual activity: Yes    Partners: Male    Birth control/protection: Condom  Other Topics Concern   Not on file  Social History Narrative   Not on file   Social Drivers of Health   Financial Resource Strain: Low Risk  (09/01/2023)   Overall Financial Resource Strain (CARDIA)    Difficulty of Paying Living Expenses: Not hard at all  Food Insecurity: No Food Insecurity (09/01/2023)   Hunger Vital Sign    Worried About Running Out of Food in the Last Year: Never true    Ran Out of Food in the Last Year: Never true  Transportation Needs: No Transportation Needs (09/01/2023)   PRAPARE - Administrator, Civil Service (Medical): No    Lack of Transportation (Non-Medical): No  Physical Activity: Sufficiently Active (09/01/2023)   Exercise Vital  Sign    Days of Exercise per Week: 5 days    Minutes of Exercise per Session: 40 min  Stress: Stress Concern Present (09/01/2023)   [employer] of Occupational Health - Occupational Stress Questionnaire    Feeling of Stress : To some extent  Social Connections: Moderately Integrated (09/01/2023)   Social Connection and Isolation Panel [NHANES]    Frequency of Communication with Friends and Family: Once a week    Frequency of Social Gatherings with Friends and Family: Once a week    Attends Religious Services: More than 4 times per year    Active Member of [employer] or Organizations: Yes    Attends Banker  Meetings: More than 4 times per year    Marital Status: Married  Catering manager Violence: Not on file    Family History: Family History  Problem Relation Age of Onset   Diabetes Mother        T1DM   Stroke Mother    Hypothyroidism Mother    CVA Mother    Hypertension Father    Depression Brother    Hypothyroidism Brother    Diabetes Maternal Aunt    Graves' disease Maternal Aunt    Cancer Maternal Aunt        x 4 aunts breast cancer -   Heart attack Maternal Aunt    Cancer Paternal Aunt        breast, pancreatic   CVA Maternal Grandmother    Cancer Maternal Grandmother        breast   Graves' disease Maternal Grandfather    CVA Paternal Grandmother    Alzheimer's disease Paternal Grandmother    Hyperlipidemia Paternal Grandfather    Hypertension Paternal Grandfather    Cancer Cousin        breast   Graves' disease Cousin    Breast cancer Cousin        breast cancer in 3 cousins   Cancer Other        Aunt- colon, Aunt-liver    Review of Systems: Denies any fevers or chills  Physical Exam: Vital Signs BP 116/79 (BP Location: Left Arm, Patient Position: Sitting, Cuff Size: Large)   Pulse 84   SpO2 98%   Physical Exam  Constitutional:      General: Not in acute distress.    Appearance: Normal appearance. Not ill-appearing.  HENT:     Head:  Normocephalic and atraumatic.  Neck:     Musculoskeletal: Normal range of motion.  Cardiovascular:     Rate and Rhythm: Normal rate Pulmonary:     Effort: Pulmonary effort is normal. No respiratory distress.  Musculoskeletal: Normal range of motion.  Skin:    General: Skin is warm and dry.     Findings: No erythema or rash.  Neurological:     Mental Status: Alert and oriented to person, place, and time. Mental status is at baseline.  Psychiatric:        Mood and Affect: Mood normal.        Behavior: Behavior normal.    Assessment/Plan: The patient is scheduled for excision of forehead lesion with Dr. Ladona Ridgel.  Risks, benefits, and alternatives of procedure discussed, questions answered and consent obtained.    Smoking Status: Non-smoker; Counseling Given?  N/A  Caprini Score: 7; Risk Factors include: History of more than 3 miscarriages, BMI greater than 25, family history of thrombosis, and length of planned surgery. Recommendation for mechanical and possible pharmacological prophylaxis. Encourage early ambulation.   Pictures obtained: @consult   Post-op Rx sent to pharmacy:  Zofran-patient states that she would like to avoid taking narcotics postoperatively.  She states that oxycodone makes her itch.  Patient reports she is just planning on taking Tylenol.  Discussed with patient she may call us postoperatively if she is having pain that is not controlled by Tylenol.  Patient expressed understanding and was in agreement with this plan.  Patient was provided with the General Surgical Risk consent document and Pain Medication Agreement prior to their appointment.  They had adequate time to read through the risk consent documents and Pain Medication Agreement. We also discussed them in person together during this preop appointment. All of their  questions were answered to their satisfaction.  Recommended calling if they have any further questions.  Risk consent form and Pain Medication  Agreement to be scanned into patient's chart.  The consent was obtained with risks and complications reviewed which included bleeding, pain, scar, infection and the risk of anesthesia.  The patients questions were answered to the patients expressed satisfaction.    Electronically signed by: Laurena Spies, PA-C 01/30/2024 1:23 PM

## 2024-01-30 NOTE — Progress Notes (Signed)
Patient ID: Donna Tucker, female    DOB: 05-Jul-1986, 38 y.o.   MRN: [id number]  Chief Complaint  Patient presents with   pre op      ICD-10-CM   1. Skin lesion  L98.9        History of Present Illness: Donna Tucker is a 38 y.o.  female  with a history of forehead lesion.  She presents for preoperative evaluation for upcoming procedure, excision of forehead lesion, scheduled for 02/17/24 with Dr. Ladona Ridgel.  Patient states that when she had general anesthesia with her thyroidectomy, she had no problems.  She did state that she had issues with her first C-section and a medication from the anesthesia caused itching.  She states that for her second C-section, she made the anesthesiologist aware of this issue and she did not have any issues with the second C-section.  Patient denies any history of cardiac disease.  She denies taking any blood thinners.  Patient reports she is not a smoker.  Patient denies taking any birth control or hormone replacement.  She states that she has had several miscarriages, which she attributes to her Graves' disease.  She denies any personal history of blood clots.  She does report her mom had an ischemic stroke and a TIA.  She denies any history of clotting diseases.  She denies any recent surgeries, traumas, infections.  She denies any history of stroke or heart attack.  She denies any history of Crohn's disease or ulcerative colitis, COPD or asthma.  She denies any history of cancer.  She denies any varicosities to her lower extremities.  Summary of Previous Visit: Patient was seen for initial consult by Dr. Ladona Ridgel in 01/18/2024.  At this visit, patient reported she had a forehead lesion that had been there for over a year.  She stated that the lesion had been treated twice unsuccessfully.  Patient was requesting removal of the lesion.  The lesion was most likely a hemangioma.  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  PMH Significant for: Hypertension, hypothyroidism, anxiety   Past Medical History: Allergies: Allergies  Allergen Reactions   Nsaids Anaphylaxis, Hives and Swelling    " all nsaids" Other reaction(s): Unknown   Salicylates Anaphylaxis, Hives and Swelling   Gluten Meal    Lactose Intolerance (Gi)    Oxycodone Hives   Wheat Hives    " all wheat products"    Current Medications:  Current Outpatient Medications:    hydrochlorothiazide (HYDRODIURIL) 25 MG tablet, Take 1 tablet by mouth daily., Disp: 30 tablet, Rfl: 5   levothyroxine (SYNTHROID) 125 MCG tablet, Take 1 tablet by mouth in the morning on an empty stomach., Disp: 90 tablet, Rfl: 3   ondansetron (ZOFRAN) 4 MG tablet, Take 1 tablet (4 mg total) by mouth every 8 (eight) hours as needed for up to 10 doses for nausea or vomiting., Disp: 10 tablet, Rfl: 0   sertraline (ZOLOFT) 25 MG tablet, Take 1 tablet (25 mg total) by mouth daily., Disp: 90 tablet, Rfl: 0   traZODone (DESYREL) 50 MG tablet, Take 1/2 tablet (25 mg total) by mouth at bedtime as needed for sleep., Disp: 45 tablet, Rfl: 1   VITAMIN D, CHOLECALCIFEROL, PO, Take 1 tablet by mouth at bedtime., Disp: , Rfl:   Past Medical Problems: Past Medical History:  Diagnosis Date   Anxiety    Blood type O+  from records review   BMI 39.0-39.9,adult 07/24/2020   Complication of anesthesia    post partum itching   Depression    Elevated blood-pressure reading without diagnosis of hypertension 07/24/2020   Gestational hypertension 10/01/2014   Gluten intolerance    Graves' disease    endocrinologist--- dr Talmage Nap-- dx 2012   Hx of pre-eclampsia in prior pregnancy, currently pregnant    Hx of varicella    Hypothyroidism    Obesity 08/14/2013   Postpartum care following cesarean delivery (1/31) 01/14/2020   Pregnancy induced hypertension    Previous cesarean delivery affecting pregnancy 01/13/2020   Skin sensation disturbance 06/01/2021   Tachycardia     Wears glasses    Wheat allergy     Past Surgical History: Past Surgical History:  Procedure Laterality Date   CESAREAN SECTION N/A 10/02/2014   Procedure: CESAREAN SECTION;  Surgeon: Lenoard Aden, MD;  Location: [facility];  Service: Obstetrics;  Laterality: N/A;   CESAREAN SECTION N/A 01/13/2020   Procedure: Repeat CESAREAN SECTION;  Surgeon: Olivia Mackie, MD;  Location: [facility];  Service: Obstetrics;  Laterality: N/A;  EDD: 02/03/20   THYROIDECTOMY N/A 11/02/2018   Procedure: TOTAL THYROIDECTOMY;  Surgeon: Darnell Level, MD;  Location: [facility];  Service: General;  Laterality: N/A;    Social History: Social History   Socioeconomic History   Marital status: Married    Spouse name: Metallurgist   Number of children: 3   Years of education: Not on file   Highest education level: Master's degree (e.g., MA, MS, MEng, MEd, MSW, MBA)  Occupational History   Not on file  Tobacco Use   Smoking status: Never   Smokeless tobacco: Never  Vaping Use   Vaping status: Never Used  Substance and Sexual Activity   Alcohol use: Yes    Alcohol/week: 3.0 standard drinks of alcohol    Types: 3 Glasses of wine per week   Drug use: Never   Sexual activity: Yes    Partners: Male    Birth control/protection: Condom  Other Topics Concern   Not on file  Social History Narrative   Not on file   Social Drivers of Health   Financial Resource Strain: Low Risk  (09/01/2023)   Overall Financial Resource Strain (CARDIA)    Difficulty of Paying Living Expenses: Not hard at all  Food Insecurity: No Food Insecurity (09/01/2023)   Hunger Vital Sign    Worried About Running Out of Food in the Last Year: Never true    Ran Out of Food in the Last Year: Never true  Transportation Needs: No Transportation Needs (09/01/2023)   PRAPARE - Administrator, Civil Service (Medical): No    Lack of Transportation (Non-Medical): No  Physical Activity: Sufficiently Active (09/01/2023)   Exercise Vital  Sign    Days of Exercise per Week: 5 days    Minutes of Exercise per Session: 40 min  Stress: Stress Concern Present (09/01/2023)   [employer] of Occupational Health - Occupational Stress Questionnaire    Feeling of Stress : To some extent  Social Connections: Moderately Integrated (09/01/2023)   Social Connection and Isolation Panel [NHANES]    Frequency of Communication with Friends and Family: Once a week    Frequency of Social Gatherings with Friends and Family: Once a week    Attends Religious Services: More than 4 times per year    Active Member of [employer] or Organizations: Yes    Attends Banker  Meetings: More than 4 times per year    Marital Status: Married  Catering manager Violence: Not on file    Family History: Family History  Problem Relation Age of Onset   Diabetes Mother        T1DM   Stroke Mother    Hypothyroidism Mother    CVA Mother    Hypertension Father    Depression Brother    Hypothyroidism Brother    Diabetes Maternal Aunt    Graves' disease Maternal Aunt    Cancer Maternal Aunt        x 4 aunts breast cancer -   Heart attack Maternal Aunt    Cancer Paternal Aunt        breast, pancreatic   CVA Maternal Grandmother    Cancer Maternal Grandmother        breast   Graves' disease Maternal Grandfather    CVA Paternal Grandmother    Alzheimer's disease Paternal Grandmother    Hyperlipidemia Paternal Grandfather    Hypertension Paternal Grandfather    Cancer Cousin        breast   Graves' disease Cousin    Breast cancer Cousin        breast cancer in 3 cousins   Cancer Other        Aunt- colon, Aunt-liver    Review of Systems: Denies any fevers or chills  Physical Exam: Vital Signs BP 116/79 (BP Location: Left Arm, Patient Position: Sitting, Cuff Size: Large)   Pulse 84   SpO2 98%   Physical Exam  Constitutional:      General: Not in acute distress.    Appearance: Normal appearance. Not ill-appearing.  HENT:     Head:  Normocephalic and atraumatic.  Neck:     Musculoskeletal: Normal range of motion.  Cardiovascular:     Rate and Rhythm: Normal rate Pulmonary:     Effort: Pulmonary effort is normal. No respiratory distress.  Musculoskeletal: Normal range of motion.  Skin:    General: Skin is warm and dry.     Findings: No erythema or rash.  Neurological:     Mental Status: Alert and oriented to person, place, and time. Mental status is at baseline.  Psychiatric:        Mood and Affect: Mood normal.        Behavior: Behavior normal.    Assessment/Plan: The patient is scheduled for excision of forehead lesion with Dr. Ladona Ridgel.  Risks, benefits, and alternatives of procedure discussed, questions answered and consent obtained.    Smoking Status: Non-smoker; Counseling Given?  N/A  Caprini Score: 7; Risk Factors include: History of more than 3 miscarriages, BMI greater than 25, family history of thrombosis, and length of planned surgery. Recommendation for mechanical and possible pharmacological prophylaxis. Encourage early ambulation.   Pictures obtained: @consult   Post-op Rx sent to pharmacy:  Zofran-patient states that she would like to avoid taking narcotics postoperatively.  She states that oxycodone makes her itch.  Patient reports she is just planning on taking Tylenol.  Discussed with patient she may call us postoperatively if she is having pain that is not controlled by Tylenol.  Patient expressed understanding and was in agreement with this plan.  Patient was provided with the General Surgical Risk consent document and Pain Medication Agreement prior to their appointment.  They had adequate time to read through the risk consent documents and Pain Medication Agreement. We also discussed them in person together during this preop appointment. All of their  questions were answered to their satisfaction.  Recommended calling if they have any further questions.  Risk consent form and Pain Medication  Agreement to be scanned into patient's chart.  The consent was obtained with risks and complications reviewed which included bleeding, pain, scar, infection and the risk of anesthesia.  The patients questions were answered to the patients expressed satisfaction.    Electronically signed by: Laurena Spies, PA-C 01/30/2024 1:23 PM

## 2024-02-08 ENCOUNTER — Other Ambulatory Visit: Payer: Self-pay

## 2024-02-08 ENCOUNTER — Other Ambulatory Visit (HOSPITAL_COMMUNITY): Payer: Self-pay

## 2024-02-10 ENCOUNTER — Encounter (HOSPITAL_BASED_OUTPATIENT_CLINIC_OR_DEPARTMENT_OTHER): Payer: Self-pay | Admitting: Plastic Surgery

## 2024-02-10 ENCOUNTER — Other Ambulatory Visit (HOSPITAL_COMMUNITY): Payer: Self-pay

## 2024-02-13 ENCOUNTER — Encounter (HOSPITAL_BASED_OUTPATIENT_CLINIC_OR_DEPARTMENT_OTHER)
Admission: RE | Admit: 2024-02-13 | Discharge: 2024-02-13 | Disposition: A | Discharge status: Home / Self Care | Source: Ambulatory Visit | Attending: Plastic Surgery | Admitting: Plastic Surgery

## 2024-02-13 ENCOUNTER — Other Ambulatory Visit: Payer: Self-pay

## 2024-02-13 DIAGNOSIS — Z01818 Encounter for other preprocedural examination: Secondary | ICD-10-CM | POA: Diagnosis not present

## 2024-02-13 LAB — BASIC METABOLIC PANEL
Anion gap: 10 (ref 5–15)
BUN: 13 mg/dL (ref 6–20)
CO2: 28 mmol/L (ref 22–32)
Calcium: 9.4 mg/dL (ref 8.9–10.3)
Chloride: 102 mmol/L (ref 98–111)
Creatinine, Ser: 0.61 mg/dL (ref 0.44–1.00)
GFR, Estimated: >60 mL/min (ref >60)
Glucose, Bld: 78 mg/dL (ref 70–99)
Potassium: 4.1 mmol/L (ref 3.5–5.1)
Sodium: 140 mmol/L (ref 135–145)

## 2024-02-13 MED ORDER — CHLORHEXIDINE GLUCONATE CLOTH 2 % EX PADS: 6.0000 | MEDICATED_PAD | Freq: Once | CUTANEOUS | Status: DC

## 2024-02-13 NOTE — Progress Notes (Signed)

## 2024-02-16 ENCOUNTER — Telehealth: Payer: Self-pay | Admitting: *Deleted

## 2024-02-16 NOTE — Telephone Encounter (Signed)
 notified of new surgery time and 915 arrival

## 2024-02-17 ENCOUNTER — Ambulatory Visit (HOSPITAL_BASED_OUTPATIENT_CLINIC_OR_DEPARTMENT_OTHER)
Admission: RE | Admit: 2024-02-17 | Discharge: 2024-02-17 | Disposition: A | Discharge status: Home / Self Care | Payer: 59 | Attending: Plastic Surgery | Admitting: Plastic Surgery

## 2024-02-17 ENCOUNTER — Ambulatory Visit (HOSPITAL_BASED_OUTPATIENT_CLINIC_OR_DEPARTMENT_OTHER): Admitting: Anesthesiology

## 2024-02-17 ENCOUNTER — Encounter (HOSPITAL_BASED_OUTPATIENT_CLINIC_OR_DEPARTMENT_OTHER): Payer: Self-pay | Admitting: Plastic Surgery

## 2024-02-17 ENCOUNTER — Other Ambulatory Visit: Payer: Self-pay

## 2024-02-17 ENCOUNTER — Encounter (HOSPITAL_BASED_OUTPATIENT_CLINIC_OR_DEPARTMENT_OTHER)
Admission: RE | Disposition: A | Discharge status: Home / Self Care | Payer: Self-pay | Source: Home / Self Care | Attending: Plastic Surgery

## 2024-02-17 DIAGNOSIS — L989 Disorder of the skin and subcutaneous tissue, unspecified: Secondary | ICD-10-CM | POA: Diagnosis not present

## 2024-02-17 DIAGNOSIS — F419 Anxiety disorder, unspecified: Secondary | ICD-10-CM | POA: Insufficient documentation

## 2024-02-17 DIAGNOSIS — E785 Hyperlipidemia, unspecified: Secondary | ICD-10-CM | POA: Insufficient documentation

## 2024-02-17 DIAGNOSIS — Z6841 Body Mass Index (BMI) 40.0 and over, adult: Secondary | ICD-10-CM | POA: Insufficient documentation

## 2024-02-17 DIAGNOSIS — I10 Essential (primary) hypertension: Secondary | ICD-10-CM

## 2024-02-17 DIAGNOSIS — F418 Other specified anxiety disorders: Secondary | ICD-10-CM | POA: Diagnosis not present

## 2024-02-17 DIAGNOSIS — E039 Hypothyroidism, unspecified: Secondary | ICD-10-CM

## 2024-02-17 DIAGNOSIS — D2339 Other benign neoplasm of skin of other parts of face: Secondary | ICD-10-CM | POA: Diagnosis not present

## 2024-02-17 DIAGNOSIS — F32A Depression, unspecified: Secondary | ICD-10-CM | POA: Diagnosis not present

## 2024-02-17 DIAGNOSIS — E890 Postprocedural hypothyroidism: Secondary | ICD-10-CM | POA: Diagnosis not present

## 2024-02-17 DIAGNOSIS — Z01818 Encounter for other preprocedural examination: Secondary | ICD-10-CM

## 2024-02-17 DIAGNOSIS — E669 Obesity, unspecified: Secondary | ICD-10-CM | POA: Diagnosis not present

## 2024-02-17 DIAGNOSIS — K9041 Non-celiac gluten sensitivity: Secondary | ICD-10-CM | POA: Diagnosis not present

## 2024-02-17 HISTORY — PX: LESION REMOVAL: SHX5196

## 2024-02-17 LAB — POCT PREGNANCY, URINE: Preg Test, Ur: NEGATIVE

## 2024-02-17 SURGERY — WIDE EXCISION, LESION, UPPER EXTREMITY
Anesthesia: Monitor Anesthesia Care | Site: Face

## 2024-02-17 MED ORDER — LACTATED RINGERS IV SOLN: INTRAVENOUS | Status: DC

## 2024-02-17 MED ORDER — PROPOFOL 10 MG/ML IV BOLUS
INTRAVENOUS | Status: AC
  Filled 2024-02-17: qty 20

## 2024-02-17 MED ORDER — DEXAMETHASONE SODIUM PHOSPHATE 10 MG/ML IJ SOLN
INTRAMUSCULAR | Status: AC
  Filled 2024-02-17: qty 1

## 2024-02-17 MED ORDER — ONDANSETRON HCL 4 MG/2ML IJ SOLN
INTRAMUSCULAR | Status: DC | PRN
  Administered 2024-02-17: 4 mg via INTRAVENOUS

## 2024-02-17 MED ORDER — FENTANYL CITRATE (PF) 100 MCG/2ML IJ SOLN
INTRAMUSCULAR | Status: AC
  Filled 2024-02-17: qty 2

## 2024-02-17 MED ORDER — LACTATED RINGERS IV SOLN
INTRAVENOUS | Status: DC | PRN
Start: 2024-02-17 — End: 2024-02-17

## 2024-02-17 MED ORDER — BUPIVACAINE-EPINEPHRINE 0.25% -1:200000 IJ SOLN
INTRAMUSCULAR | Status: DC | PRN
  Administered 2024-02-17: 1.5 mL

## 2024-02-17 MED ORDER — MIDAZOLAM HCL 5 MG/5ML IJ SOLN
INTRAMUSCULAR | Status: DC | PRN
  Administered 2024-02-17: 2 mg via INTRAVENOUS

## 2024-02-17 MED ORDER — MIDAZOLAM HCL 2 MG/2ML IJ SOLN
INTRAMUSCULAR | Status: AC
  Filled 2024-02-17: qty 2

## 2024-02-17 MED ORDER — FENTANYL CITRATE (PF) 100 MCG/2ML IJ SOLN
INTRAMUSCULAR | Status: DC | PRN
  Administered 2024-02-17: 50 ug via INTRAVENOUS
  Administered 2024-02-17 (×2): 25 ug via INTRAVENOUS

## 2024-02-17 MED ORDER — PROPOFOL 500 MG/50ML IV EMUL
INTRAVENOUS | Status: DC | PRN
  Administered 2024-02-17: 200 ug/kg/min via INTRAVENOUS

## 2024-02-17 MED ORDER — ONDANSETRON HCL 4 MG/2ML IJ SOLN
INTRAMUSCULAR | Status: AC
  Filled 2024-02-17: qty 2

## 2024-02-17 MED ORDER — BUPIVACAINE-EPINEPHRINE (PF) 0.25% -1:200000 IJ SOLN
INTRAMUSCULAR | Status: AC
Start: 2024-02-17 — End: ?
  Filled 2024-02-17: qty 30

## 2024-02-17 SURGICAL SUPPLY — 58 items
BLADE CLIPPER SURG (BLADE) IMPLANT
BLADE SURG 15 STRL LF DISP TIS (BLADE) ×1 IMPLANT
BNDG ELASTIC 2INX 5YD STR LF (GAUZE/BANDAGES/DRESSINGS) IMPLANT
CANISTER SUCT 1200ML W/VALVE (MISCELLANEOUS) IMPLANT
CORD BIPOLAR FORCEPS 12FT (ELECTRODE) IMPLANT
COVER BACK TABLE 60X90IN (DRAPES) ×1 IMPLANT
COVER MAYO STAND STRL (DRAPES) ×1 IMPLANT
DERMABOND ADVANCED .7 DNX12 (GAUZE/BANDAGES/DRESSINGS) IMPLANT
DRAPE LAPAROTOMY 100X72 PEDS (DRAPES) IMPLANT
DRAPE U-SHAPE 76X120 STRL (DRAPES) IMPLANT
DRSG MEPILEX POST OP 4X8 (GAUZE/BANDAGES/DRESSINGS) IMPLANT
DRSG TEGADERM 2-3/8X2-3/4 SM (GAUZE/BANDAGES/DRESSINGS) IMPLANT
ELECT NDL BLADE 2-5/6 (NEEDLE) ×1 IMPLANT
ELECT NEEDLE BLADE 2-5/6 (NEEDLE) ×1 IMPLANT
ELECT REM PT RETURN 9FT ADLT (ELECTROSURGICAL) ×1 IMPLANT
ELECT REM PT RETURN 9FT PED (ELECTROSURGICAL) IMPLANT
ELECTRODE REM PT RETRN 9FT PED (ELECTROSURGICAL) IMPLANT
ELECTRODE REM PT RTRN 9FT ADLT (ELECTROSURGICAL) IMPLANT
GAUZE SPONGE 2X2 STRL 8-PLY (GAUZE/BANDAGES/DRESSINGS) IMPLANT
GAUZE SPONGE 4X4 12PLY STRL LF (GAUZE/BANDAGES/DRESSINGS) IMPLANT
GAUZE STRETCH 2X75IN STRL (MISCELLANEOUS) IMPLANT
GAUZE XEROFORM 1X8 LF (GAUZE/BANDAGES/DRESSINGS) IMPLANT
GLOVE BIO SURGEON STRL SZ 6.5 (GLOVE) IMPLANT
GLOVE BIO SURGEON STRL SZ7.5 (GLOVE) IMPLANT
GLOVE BIO SURGEON STRL SZ8 (GLOVE) ×1 IMPLANT
GLOVE BIOGEL PI IND STRL 7.0 (GLOVE) IMPLANT
GLOVE BIOGEL PI IND STRL 8 (GLOVE) ×1 IMPLANT
GOWN STRL REUS W/ TWL LRG LVL3 (GOWN DISPOSABLE) ×2 IMPLANT
NDL HYPO 30GX1 BEV (NEEDLE) ×1 IMPLANT
NDL PRECISIONGLIDE 27X1.5 (NEEDLE) IMPLANT
NEEDLE HYPO 30GX1 BEV (NEEDLE) ×1 IMPLANT
NEEDLE PRECISIONGLIDE 27X1.5 (NEEDLE) IMPLANT
NS IRRIG 1000ML POUR BTL (IV SOLUTION) IMPLANT
PACK BASIN DAY SURGERY FS (CUSTOM PROCEDURE TRAY) ×1 IMPLANT
PENCIL SMOKE EVACUATOR (MISCELLANEOUS) IMPLANT
SHEET MEDIUM DRAPE 40X70 STRL (DRAPES) IMPLANT
SOL PREP POV-IOD 4OZ 10% (MISCELLANEOUS) IMPLANT
SPIKE FLUID TRANSFER (MISCELLANEOUS) ×1 IMPLANT
STRIP CLOSURE SKIN 1/2X4 (GAUZE/BANDAGES/DRESSINGS) IMPLANT
SUCTION TUBE FRAZIER 10FR DISP (SUCTIONS) IMPLANT
SUT MNCRL 6-0 UNDY P1 1X18 (SUTURE) IMPLANT
SUT MNCRL AB 4-0 PS2 18 (SUTURE) IMPLANT
SUT MON AB 5-0 P3 18 (SUTURE) IMPLANT
SUT MON AB 5-0 PS2 18 (SUTURE) IMPLANT
SUT PROLENE 4 0 PS 2 18 (SUTURE) IMPLANT
SUT PROLENE 5 0 P 3 (SUTURE) IMPLANT
SUT PROLENE 5 0 PS 2 (SUTURE) IMPLANT
SUT PROLENE 6 0 P 1 18 (SUTURE) IMPLANT
SUT SILK 2 0 SH (SUTURE) IMPLANT
SUT VIC AB 3-0 SH 27X BRD (SUTURE) IMPLANT
SUT VIC AB 4-0 PS2 18 (SUTURE) IMPLANT
SUT VIC AB 5-0 P-3 18X BRD (SUTURE) IMPLANT
SUT VIC AB 5-0 PS2 18 (SUTURE) IMPLANT
SYR BULB EAR ULCER 3OZ GRN STR (SYRINGE) IMPLANT
SYR CONTROL 10ML LL (SYRINGE) ×1 IMPLANT
TOWEL GREEN STERILE FF (TOWEL DISPOSABLE) ×1 IMPLANT
TRAY DSU PREP LF (CUSTOM PROCEDURE TRAY) ×1 IMPLANT
TUBE CONNECTING 20X1/4 (TUBING) IMPLANT

## 2024-02-17 NOTE — Anesthesia Procedure Notes (Signed)
 Date/Time: 02/17/2024 12:29 PM  Performed by: Karen Kitchens, CRNA

## 2024-02-17 NOTE — Discharge Instructions (Addendum)
 INSTRUCTIONS FOR AFTER SURGERY   You will likely have some questions about what to expect following your operation.  The following information will help you and your family understand what to expect when you are discharged from the hospital.  It is important to follow these guidelines to help ensure a smooth recovery and reduce complication.  Postoperative instructions include information on: diet, wound care, medications and physical activity.  AFTER SURGERY Expect to go home after the procedure.  In some cases, you may need to spend one night in the hospital for observation.  DIET The healthier you eat the better your body will heal. It is important to increasing your protein intake.  This means limiting the foods with sugar and carbohydrates.  Focus on vegetables and some meat.  If you have liposuction during your procedure be sure to drink water.  If your urine is bright yellow, then it is concentrated, and you need to drink more water.  As a general rule after surgery, you should have 8 ounces of water every hour while awake.  If you find you are persistently nauseated or unable to take in liquids let us know.  NO TOBACCO USE or EXPOSURE.  This will slow your healing process and lead to a wound.  WOUND CARE After 24 hours, you may remove your dressings and shower. Allow soap and water to run over the incision. Do not scrub the incision. Do not submerge the incision in bath, pool, tub, etc.  No baths, pools or hot tubs for four weeks. We close your incision to leave the smallest and best-looking scar. No ointment or creams on your incisions for four weeks.  No Neosporin (Too many skin reactions).  A few weeks after surgery you can use Mederma and start massaging the scar.  ACTIVITY No heavy lifting until cleared by the doctor.  This usually means no more than a half-gallon of milk.  It is OK to walk and climb stairs. Moving your legs is very important to decrease your risk of a blood clot.  It  will also help keep you from getting deconditioned.  Every 1 to 2 hours get up and walk for 5 minutes. This will help with a quicker recovery back to normal.  Let pain be your guide so you don't do too much.  This time is for you to recover.  You will be more comfortable if you sleep and rest with your head elevated either with a few pillows under you or in a recliner.  No stomach sleeping for a three months.   DRIVING Arrange for someone to bring you home from the hospital after your surgery.  You may be able to drive a few days after surgery but not while taking any narcotics or valium.  BOWEL MOVEMENTS Constipation can occur after anesthesia and while taking pain medication.  It is important to stay ahead for your comfort.  We recommend taking Milk of Magnesia (2 tablespoons; twice a day) while taking the pain pills.  MEDICATIONS You may be prescribed should start after surgery At your preoperative visit for you history and physical you may have been given the following medications: Zofran 4 mg:  This is to treat nausea and vomiting.  You can take this every 6 hours as needed and only if needed.   Over the counter Medication to take: Ibuprofen (Motrin) 600 mg:  Take this every 6 hours.  If you have additional pain then take 500 mg of the Tylenol every 8  hours.  Only take the Norco after you have tried these two. MiraLAX or Milk of Magnesia: Take this according to the bottle if you take the Norco.  WHEN TO CALL Call your surgeon's office if any of the following occur: Fever 101 degrees F or greater Excessive bleeding or fluid from the incision site. Pain that increases over time without aid from the medications Redness, warmth, or pus draining from incision sites Persistent nausea or inability to take in liquids Severe misshapen area that underwent the operation.

## 2024-02-17 NOTE — Op Note (Signed)
 DATE OF OPERATION: 02/17/2024  LOCATION: [facility] operating Room  PREOPERATIVE DIAGNOSIS: Skin lesion  POSTOPERATIVE DIAGNOSIS: Same  PROCEDURE: Excision of forehead skin lesion  SURGEON: Loren Racer, MD  ASSISTANT: Caroline More  EBL: 2 cc  CONDITION: Stable  COMPLICATIONS: None  INDICATION: The patient, Donna Tucker, is a 38 y.o. female born on 1986-11-14, is here for removal of a skin lesion for pathologic diagnosis.   PROCEDURE DETAILS:  The patient was seen prior to surgery and marked.  No IV antibiotics were given. The patient was taken to the operating room and given an  IV anesthetic. A standard time out was performed and all information was confirmed by those in the room. SCDs were placed.   The area surrounding the lesion was infiltrated with 0.25% Marcaine with epi. An elliptical incision was made around the lesion down to the subcutaneous tissue. The lesion was removed with a second portion of subcutaneous tissue. The tissue was sent to pathology. The lesion measured approximately 3 mm and the incision was 1 cm. The incision was closed with a single 5-0 Monocryl subcuticular stitch.  The skin edges were approximated with interrupted 5-0 Prolene sutures.  The incision was covered with a Band-Aid.  All instrument needle and sponge counts were reported as correct and there were no complications. The patient was allowed to wake up and taken to recovery room in stable condition at the end of the case. The family was notified at the end of the case.   The advanced practice practitioner (APP) assisted throughout the case.  The APP was essential in retraction and counter traction when needed to make the case progress smoothly.  This retraction and assistance made it possible to see the tissue plans for the procedure.  The assistance was needed for blood control, tissue re-approximation and assisted with closure of the incision site.

## 2024-02-17 NOTE — Anesthesia Preprocedure Evaluation (Addendum)
 Anesthesia Evaluation  Patient identified by MRN, date of birth, ID band Patient awake  General Assessment Comment:Had pruritus with C/section and narcotics  Reviewed: Allergy & Precautions, NPO status , Patient's Chart, lab work & pertinent test results  History of Anesthesia Complications (+) history of anesthetic complications  Airway Mallampati: II       Dental no notable dental hx. (+) Teeth Intact, Caps, Dental Advisory Given   Pulmonary neg pulmonary ROS   Pulmonary exam normal breath sounds clear to auscultation       Cardiovascular hypertension, Pt. on medications Normal cardiovascular exam Rhythm:Regular Rate:Normal  EKG 02/13/24 NSR, non specific ST-T wave changes   Neuro/Psych  PSYCHIATRIC DISORDERS Anxiety Depression    negative neurological ROS     GI/Hepatic negative GI ROS, Neg liver ROS,,,Gluten intolerance   Endo/Other  Hypothyroidism  Class 3 obesityGrave's disease S/P thyroidectomy Hyperlipidemia  Renal/GU negative Renal ROS  negative genitourinary   Musculoskeletal Forehead lesion   Abdominal  (+) + obese  Peds  Hematology   Anesthesia Other Findings   Reproductive/Obstetrics negative OB ROS                             Anesthesia Physical Anesthesia Plan  ASA: 3  Anesthesia Plan: MAC   Post-op Pain Management: Minimal or no pain anticipated, Tylenol PO (pre-op)* and Precedex   Induction: Intravenous  PONV Risk Score and Plan: 3 and Treatment may vary due to age or medical condition, TIVA, Ondansetron and Dexamethasone  Airway Management Planned: Natural Airway and Simple Face Mask  Additional Equipment: None  Intra-op Plan:   Post-operative Plan:   Informed Consent: I have reviewed the patients History and Physical, chart, labs and discussed the procedure including the risks, benefits and alternatives for the proposed anesthesia with the patient or  authorized representative who has indicated his/her understanding and acceptance.     Dental advisory given  Plan Discussed with: CRNA and Anesthesiologist  Anesthesia Plan Comments:         Anesthesia Quick Evaluation

## 2024-02-17 NOTE — Transfer of Care (Signed)
 Immediate Anesthesia Transfer of Care Note  Patient: Donna Tucker  Procedure(s) Performed: Excision of forehead lesion (Face)  Patient Location: PACU  Anesthesia Type:MAC  Level of Consciousness: awake, alert , and oriented  Airway & Oxygen Therapy: Patient Spontanous Breathing and Patient connected to face mask oxygen  Post-op Assessment: Report given to RN and Post -op Vital signs reviewed and stable  Post vital signs: Reviewed and stable  Last Vitals:  Vitals Value Taken Time  BP    Temp    Pulse    Resp    SpO2      Last Pain:  Vitals:   02/17/24 0930  TempSrc: Temporal  PainSc: 0-No pain      Patients Stated Pain Goal: 3 (02/17/24 0930)  Complications: No notable events documented.

## 2024-02-17 NOTE — Anesthesia Postprocedure Evaluation (Signed)
 Anesthesia Post Note  Patient: Donna Tucker  Procedure(s) Performed: Excision of forehead lesion (Face)     Patient location during evaluation: PACU Anesthesia Type: MAC Level of consciousness: awake and alert Pain management: pain level controlled Vital Signs Assessment: post-procedure vital signs reviewed and stable Respiratory status: spontaneous breathing, nonlabored ventilation, respiratory function stable and patient connected to nasal cannula oxygen Cardiovascular status: stable and blood pressure returned to baseline Postop Assessment: no apparent nausea or vomiting Anesthetic complications: no  No notable events documented.  Last Vitals:  Vitals:   02/17/24 0930 02/17/24 1259  BP: (!) 129/95 109/73  Pulse: 78 71  Resp: 20 16  Temp: 36.6 C (!) 36.2 C  SpO2:  99%    Last Pain:  Vitals:   02/17/24 1259  TempSrc: Temporal  PainSc: 0-No pain                 Dondi Aime L Kanen Mottola

## 2024-02-17 NOTE — Interval H&P Note (Signed)
 History and Physical Interval Note: No change in exam or indication for surgery Site marked with her concurrence All questions answered to her satisfaction Will proceed with excision of forehead skin lesion at her request  02/17/2024 11:51 AM  Caryl Bis  has presented today for surgery, with the diagnosis of Skin lesion.  The various methods of treatment have been discussed with the patient and family. After consideration of risks, benefits and other options for treatment, the patient has consented to  Procedure(s): Excision of forehead lesion (N/A) as a surgical intervention.  The patient's history has been reviewed, patient examined, no change in status, stable for surgery.  I have reviewed the patient's chart and labs.  Questions were answered to the patient's satisfaction.     Santiago Glad

## 2024-02-18 ENCOUNTER — Encounter (HOSPITAL_BASED_OUTPATIENT_CLINIC_OR_DEPARTMENT_OTHER): Payer: Self-pay | Admitting: Plastic Surgery

## 2024-02-21 LAB — SURGICAL PATHOLOGY

## 2024-02-22 ENCOUNTER — Ambulatory Visit (INDEPENDENT_AMBULATORY_CARE_PROVIDER_SITE_OTHER): Payer: 59 | Admitting: Plastic Surgery

## 2024-02-22 VITALS — BP 121/82 | HR 69

## 2024-02-22 DIAGNOSIS — D239 Other benign neoplasm of skin, unspecified: Secondary | ICD-10-CM

## 2024-02-22 DIAGNOSIS — L989 Disorder of the skin and subcutaneous tissue, unspecified: Secondary | ICD-10-CM

## 2024-02-22 NOTE — Progress Notes (Signed)
 Donna Tucker returns today for suture removal after excision of a forehead lesion.  She has had no problems and was happy with the overall procedure.  On examination the incision is healing nicely.  The edges are well-approximated.  Pathology revealed a dermatofibroma.  Sutures were removed without difficulty.  Discussed scar management including sun avoidance, scar massage beginning 2 weeks after procedure.  Follow-up as needed.

## 2024-03-01 ENCOUNTER — Ambulatory Visit: Payer: 59 | Admitting: Family Medicine

## 2024-03-07 ENCOUNTER — Encounter: Payer: 59 | Admitting: Student

## 2024-03-13 ENCOUNTER — Other Ambulatory Visit (HOSPITAL_COMMUNITY): Payer: Self-pay

## 2024-03-13 ENCOUNTER — Other Ambulatory Visit: Payer: Self-pay

## 2024-03-13 ENCOUNTER — Other Ambulatory Visit: Payer: Self-pay | Admitting: Family Medicine

## 2024-03-13 ENCOUNTER — Ambulatory Visit: Admitting: Family Medicine

## 2024-03-13 VITALS — BP 110/76 | HR 65 | Temp 98.0°F | Resp 18 | Ht 64.0 in | Wt 247.9 lb

## 2024-03-13 DIAGNOSIS — Z136 Encounter for screening for cardiovascular disorders: Secondary | ICD-10-CM | POA: Diagnosis not present

## 2024-03-13 DIAGNOSIS — E890 Postprocedural hypothyroidism: Secondary | ICD-10-CM | POA: Diagnosis not present

## 2024-03-13 DIAGNOSIS — Z13220 Encounter for screening for lipoid disorders: Secondary | ICD-10-CM | POA: Diagnosis not present

## 2024-03-13 DIAGNOSIS — Z0000 Encounter for general adult medical examination without abnormal findings: Secondary | ICD-10-CM | POA: Diagnosis not present

## 2024-03-13 DIAGNOSIS — E039 Hypothyroidism, unspecified: Secondary | ICD-10-CM

## 2024-03-13 DIAGNOSIS — R7302 Impaired glucose tolerance (oral): Secondary | ICD-10-CM

## 2024-03-13 DIAGNOSIS — F419 Anxiety disorder, unspecified: Secondary | ICD-10-CM

## 2024-03-13 MED ORDER — LEVOTHYROXINE SODIUM 125 MCG PO TABS
125.0000 ug | ORAL_TABLET | Freq: Every day | ORAL | 1 refills | Status: DC
  Filled 2024-03-13: qty 90, 90d supply, fill #0
  Filled 2024-06-07: qty 90, 90d supply, fill #1

## 2024-03-13 MED ORDER — SERTRALINE HCL 25 MG PO TABS
25.0000 mg | ORAL_TABLET | Freq: Every day | ORAL | 1 refills | Status: DC
  Filled 2024-03-13 – 2024-04-17 (×2): qty 90, 90d supply, fill #0
  Filled 2024-07-16: qty 90, 90d supply, fill #1

## 2024-03-13 NOTE — Progress Notes (Signed)
 Complete physical exam  Patient: Donna Tucker   DOB: 12/13/1986   38 y.o. Female  MRN: [id number]  Subjective:    Chief Complaint  Patient presents with   Annual Exam    Donna Tucker is a 38 y.o. female who presents today for a complete physical exam. She reports consuming a  Noom High protein, less carbohydrate.  diet.  Moderate and aerobic.  She generally feels fairly well. She reports sleeping well. She does not have additional problems to discuss today.    Most recent fall risk assessment:    01/12/2023   11:21 AM  Fall Risk   Falls in the past year? 0  Number falls in past yr: 0  Injury with Fall? 0  Risk for fall due to : No Fall Risks  Follow up Falls evaluation completed     Most recent depression screenings:    01/12/2023   11:22 AM 03/04/2022    2:04 PM  PHQ 2/9 Scores  PHQ - 2 Score 2 0  PHQ- 9 Score 9   Exception Documentation  Medical reason    Vision:Not within last year   Patient Active Problem List   Diagnosis Date Noted   Hypertension 06/01/2021   Encounter for medical examination to establish care 06/01/2021   Weight gain 06/01/2021   BMI 40.0-44.9, adult (HCC) 06/01/2021   Depression 07/24/2020   Anxiety 07/24/2020   History of anemia 07/24/2020   Vitamin D deficiency 07/24/2020   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   Cesarean delivery due to maternal disorder 01/13/2020   Graves' disease 08/14/2013   Past Medical History:  Diagnosis Date   Anxiety    Blood type O+    from records review   BMI 39.0-39.9,adult 07/24/2020   Complication of anesthesia    post partum itching   Depression    Elevated blood-pressure reading without diagnosis of hypertension 07/24/2020   Gestational hypertension 10/01/2014   Gluten intolerance    Graves' disease    endocrinologist--- dr Talmage Nap-- dx 2012   Hx of pre-eclampsia in prior pregnancy, currently pregnant    Hx of varicella    Hypothyroidism    Obesity 08/14/2013   Postpartum care  following cesarean delivery (1/31) 01/14/2020   Pregnancy induced hypertension    Previous cesarean delivery affecting pregnancy 01/13/2020   Skin sensation disturbance 06/01/2021   Tachycardia    Wears glasses    Wheat allergy    Past Surgical History:  Procedure Laterality Date   CESAREAN SECTION N/A 10/02/2014   Procedure: CESAREAN SECTION;  Surgeon: Lenoard Aden, MD;  Location: [facility];  Service: Obstetrics;  Laterality: N/A;   CESAREAN SECTION N/A 01/13/2020   Procedure: Repeat CESAREAN SECTION;  Surgeon: Olivia Mackie, MD;  Location: [facility];  Service: Obstetrics;  Laterality: N/A;  EDD: 02/03/20   LESION REMOVAL N/A 02/17/2024   Procedure: Excision of forehead lesion;  Surgeon: Santiago Glad, MD;  Location: [facility];  Service: Plastics;  Laterality: N/A;   THYROIDECTOMY N/A 11/02/2018   Procedure: TOTAL THYROIDECTOMY;  Surgeon: Darnell Level, MD;  Location: [facility];  Service: General;  Laterality: N/A;   Social History   Tobacco Use   Smoking status: Never   Smokeless tobacco: Never  Vaping Use   Vaping status: Never Used  Substance Use Topics   Alcohol use: Yes    Alcohol/week: 3.0 standard drinks of alcohol    Types: 3 Glasses of wine per week   Drug use: Never  Family Status  Relation Name Status   Mother  Alive   Father  Alive   Brother  Alive   Daughter  Alive   Son  Alive   Mat Aunt  Deceased   Oceanographer  (Not Specified)   MGM  (Not Specified)   MGF  (Not Specified)   PGM  (Not Specified)   PGF  (Not Specified)   Cousin  (Not Specified)   Cousin  Alive   Other  (Not Specified)  No partnership data on file   Allergies  Allergen Reactions   Nsaids Anaphylaxis, Hives and Swelling    " all nsaids" Other reaction(s): Unknown   Salicylates Anaphylaxis, Hives and Swelling   Gluten Meal    Lactose Intolerance (Gi)    Oxycodone Hives   Wheat Hives    " all wheat products"      Patient Care Team: Suzan Slick, MD as PCP -  General (Family Medicine) Olivia Mackie, MD as Consulting Physician (Obstetrics and Gynecology) Dorisann Frames, MD as Referring Physician (Endocrinology)   Outpatient Medications Prior to Visit  Medication Sig   hydrochlorothiazide (HYDRODIURIL) 25 MG tablet Take 1 tablet by mouth daily.   ondansetron (ZOFRAN) 4 MG tablet Take 1 tablet (4 mg total) by mouth every 8 (eight) hours as needed for up to 10 doses for nausea or vomiting.   sertraline (ZOLOFT) 25 MG tablet Take 1 tablet (25 mg total) by mouth daily.   traZODone (DESYREL) 50 MG tablet Take 0.5 tablets (25 mg total) by mouth at bedtime as needed for sleep.   VITAMIN D, CHOLECALCIFEROL, PO Take 1 tablet by mouth at bedtime.   No facility-administered medications prior to visit.    Review of Systems  All other systems reviewed and are negative.        Objective:     BP 110/76   Pulse 65   Temp 98 F (36.7 C) (Oral)   Resp 18   Ht 5\' 4"  (1.626 m)   Wt 247 lb 14.4 oz (112.4 kg)   LMP 01/27/2024 (Approximate)   SpO2 100%   BMI 42.55 kg/m  BP Readings from Last 3 Encounters:  03/13/24 110/76  02/22/24 121/82  02/17/24 109/73      Physical Exam Vitals and nursing note reviewed.  Constitutional:      Appearance: Normal appearance. She is obese.  HENT:     Head: Normocephalic and atraumatic.     Right Ear: Tympanic membrane, ear canal and external ear normal.     Left Ear: Tympanic membrane, ear canal and external ear normal.     Nose: Nose normal.     Mouth/Throat:     Mouth: Mucous membranes are moist.     Pharynx: Oropharynx is clear.  Eyes:     Conjunctiva/sclera: Conjunctivae normal.     Pupils: Pupils are equal, round, and reactive to light.  Cardiovascular:     Rate and Rhythm: Normal rate and regular rhythm.     Pulses: Normal pulses.     Heart sounds: Normal heart sounds.  Pulmonary:     Effort: Pulmonary effort is normal.     Breath sounds: Normal breath sounds.  Abdominal:     General:  Abdomen is flat. Bowel sounds are normal.  Skin:    General: Skin is warm.     Capillary Refill: Capillary refill takes less than 2 seconds.  Neurological:     General: No focal deficit present.     Mental Status:  She is alert and oriented to person, place, and time. Mental status is at baseline.  Psychiatric:        Mood and Affect: Mood normal.        Behavior: Behavior normal.        Thought Content: Thought content normal.        Judgment: Judgment normal.     No results found for any visits on 03/13/24. Last CBC Lab Results  Component Value Date   WBC 6.9 03/02/2023   HGB 14.3 03/02/2023   HCT 42.6 03/02/2023   MCV 92 03/02/2023   MCH 31.0 03/02/2023   RDW 13.3 03/02/2023   PLT 282 03/02/2023   Last metabolic panel Lab Results  Component Value Date   GLUCOSE 78 02/13/2024   NA 140 02/13/2024   K 4.1 02/13/2024   CL 102 02/13/2024   CO2 28 02/13/2024   BUN 13 02/13/2024   CREATININE 0.61 02/13/2024   GFRNONAA >60 02/13/2024   CALCIUM 9.4 02/13/2024   PROT 7.9 03/02/2023   ALBUMIN 4.4 03/02/2023   LABGLOB 3.5 03/02/2023   AGRATIO 1.3 03/02/2023   BILITOT 0.4 03/02/2023   ALKPHOS 111 03/02/2023   AST 16 03/02/2023   ALT 18 03/02/2023   ANIONGAP 10 02/13/2024   Last lipids Lab Results  Component Value Date   CHOL 224 (H) 09/09/2023   HDL 34 (L) 09/09/2023   LDLCALC 161 (H) 09/09/2023   TRIG 156 (H) 09/09/2023   CHOLHDL 6.6 (H) 09/09/2023   Last hemoglobin A1c Lab Results  Component Value Date   HGBA1C 5.1 03/02/2023        Assessment & Plan:    Routine Health Maintenance and Physical Exam  Immunization History  Administered Date(s) Administered   Influenza Split 10/12/2022   Influenza, Seasonal, Injecte, Preservative Fre 09/02/2023   Influenza,inj,Quad PF,6+ Mos 10/03/2014   Influenza-Unspecified 09/15/2017, 08/22/2018, 09/12/2021   MMR 10/04/2014   PFIZER Comirnaty(Gray Top)Covid-19 Tri-Sucrose Vaccine 09/02/2023   PFIZER(Purple  Top)SARS-COV-2 Vaccination 12/24/2019, 01/21/2020, 10/31/2020   Pfizer Covid-19 Vaccine Bivalent Booster 19yrs & up 09/30/2021   Rabies, IM 11/21/2014, 11/24/2014, 11/28/2014, 12/05/2014   Tdap 02/12/2008, 04/01/2017    Health Maintenance  Topic Date Due   COVID-19 Vaccine (6 - 2024-25 season) 10/28/2023   INFLUENZA VACCINE  07/13/2024   DTaP/Tdap/Td (3 - Td or Tdap) 04/02/2027   Cervical Cancer Screening (HPV/Pap Cotest)  03/01/2028   Hepatitis C Screening  Completed   HIV Screening  Completed   HPV VACCINES  Aged Out    Discussed health benefits of physical activity, and encouraged her to engage in regular exercise appropriate for her age and condition.  Problem List Items Addressed This Visit   None  No follow-ups on file. Annual physical exam  Encounter for lipid screening for cardiovascular disease -     Lipid panel  Impaired glucose tolerance -     CBC with Differential/Platelet -     Comprehensive metabolic panel with GFR -     Hemoglobin A1c  Postoperative hypothyroidism -     TSH -     T4, free   Screening labs See back in 6 months sooner prn    Suzan Slick, MD

## 2024-03-14 ENCOUNTER — Encounter: Payer: Self-pay | Admitting: Family Medicine

## 2024-03-14 LAB — CBC WITH DIFFERENTIAL/PLATELET
Basophils Absolute: 0 10*3/uL (ref 0.0–0.2)
Basos: 1 %
EOS (ABSOLUTE): 0.2 10*3/uL (ref 0.0–0.4)
Eos: 3 %
Hematocrit: 42.2 % (ref 34.0–46.6)
Hemoglobin: 13.9 g/dL (ref 11.1–15.9)
Immature Grans (Abs): 0.1 10*3/uL (ref 0.0–0.1)
Immature Granulocytes: 1 %
Lymphocytes Absolute: 2 10*3/uL (ref 0.7–3.1)
Lymphs: 30 %
MCH: 30.3 pg (ref 26.6–33.0)
MCHC: 32.9 g/dL (ref 31.5–35.7)
MCV: 92 fL (ref 79–97)
Monocytes Absolute: 0.5 10*3/uL (ref 0.1–0.9)
Monocytes: 7 %
Neutrophils Absolute: 4 10*3/uL (ref 1.4–7.0)
Neutrophils: 58 %
Platelets: 281 10*3/uL (ref 150–450)
RBC: 4.58 x10E6/uL (ref 3.77–5.28)
RDW: 13.1 % (ref 11.7–15.4)
WBC: 6.8 10*3/uL (ref 3.4–10.8)

## 2024-03-14 LAB — COMPREHENSIVE METABOLIC PANEL WITH GFR
ALT: 16 IU/L (ref 0–32)
AST: 17 IU/L (ref 0–40)
Albumin: 4.5 g/dL (ref 3.9–4.9)
Alkaline Phosphatase: 96 IU/L (ref 44–121)
BUN/Creatinine Ratio: 20 (ref 9–23)
BUN: 15 mg/dL (ref 6–20)
Bilirubin Total: 0.3 mg/dL (ref 0.0–1.2)
CO2: 23 mmol/L (ref 20–29)
Calcium: 9.7 mg/dL (ref 8.7–10.2)
Chloride: 101 mmol/L (ref 96–106)
Creatinine, Ser: 0.74 mg/dL (ref 0.57–1.00)
Globulin, Total: 2.9 g/dL (ref 1.5–4.5)
Glucose: 75 mg/dL (ref 70–99)
Potassium: 3.9 mmol/L (ref 3.5–5.2)
Sodium: 140 mmol/L (ref 134–144)
Total Protein: 7.4 g/dL (ref 6.0–8.5)
eGFR: 107 mL/min/{1.73_m2} (ref >59)

## 2024-03-14 LAB — TSH: TSH: 1.69 u[IU]/mL (ref 0.450–4.500)

## 2024-03-14 LAB — LIPID PANEL
Chol/HDL Ratio: 5 ratio — ABNORMAL HIGH (ref 0.0–4.4)
Cholesterol, Total: 217 mg/dL — ABNORMAL HIGH (ref 100–199)
HDL: 43 mg/dL (ref >39)
LDL Chol Calc (NIH): 153 mg/dL — ABNORMAL HIGH (ref 0–99)
Triglycerides: 117 mg/dL (ref 0–149)
VLDL Cholesterol Cal: 21 mg/dL (ref 5–40)

## 2024-03-14 LAB — T4, FREE: Free T4: 1.43 ng/dL (ref 0.82–1.77)

## 2024-03-14 LAB — HEMOGLOBIN A1C
Est. average glucose Bld gHb Est-mCnc: 94 mg/dL
Hgb A1c MFr Bld: 4.9 % (ref 4.8–5.6)

## 2024-03-23 ENCOUNTER — Encounter: Payer: 59 | Admitting: Student

## 2024-04-03 ENCOUNTER — Encounter: Admitting: Family Medicine

## 2024-04-13 ENCOUNTER — Encounter: Admitting: Family Medicine

## 2024-04-17 ENCOUNTER — Other Ambulatory Visit (HOSPITAL_COMMUNITY): Payer: Self-pay

## 2024-04-30 DIAGNOSIS — H5213 Myopia, bilateral: Secondary | ICD-10-CM | POA: Diagnosis not present

## 2024-04-30 DIAGNOSIS — H52223 Regular astigmatism, bilateral: Secondary | ICD-10-CM | POA: Diagnosis not present

## 2024-06-07 ENCOUNTER — Other Ambulatory Visit: Payer: Self-pay

## 2024-06-14 ENCOUNTER — Encounter: Payer: Self-pay | Admitting: Pharmacist

## 2024-06-14 ENCOUNTER — Other Ambulatory Visit: Payer: Self-pay

## 2024-07-09 ENCOUNTER — Other Ambulatory Visit (HOSPITAL_BASED_OUTPATIENT_CLINIC_OR_DEPARTMENT_OTHER): Payer: Self-pay | Admitting: Family Medicine

## 2024-07-09 DIAGNOSIS — I10 Essential (primary) hypertension: Secondary | ICD-10-CM

## 2024-07-10 ENCOUNTER — Other Ambulatory Visit: Payer: Self-pay

## 2024-07-10 ENCOUNTER — Other Ambulatory Visit (HOSPITAL_COMMUNITY): Payer: Self-pay

## 2024-07-10 MED ORDER — HYDROCHLOROTHIAZIDE 25 MG PO TABS
25.0000 mg | ORAL_TABLET | Freq: Every day | ORAL | 0 refills | Status: DC
  Filled 2024-07-10: qty 90, 90d supply, fill #0

## 2024-08-12 ENCOUNTER — Telehealth: Admitting: Family

## 2024-08-12 DIAGNOSIS — R112 Nausea with vomiting, unspecified: Secondary | ICD-10-CM

## 2024-08-12 NOTE — Progress Notes (Signed)
  Because your symptoms have been on going for more than 3 days, I feel your condition warrants further evaluation and I recommend that you be seen in a face-to-face visit.   NOTE: There will be NO CHARGE for this E-Visit   If you are having a true medical emergency, please call 911.     For an urgent face to face visit, [facility] has multiple urgent care centers for your convenience.  Click the link below for the full list of locations and hours, walk-in wait times, appointment scheduling options and driving directions:  Urgent Care - [place], [place], [place], [place], [place], [place]  [place]     Your MyChart E-visit questionnaire answers were reviewed by a board certified advanced clinical practitioner to complete your personal care plan based on your specific symptoms.    Thank you for using e-Visits.

## 2024-08-13 DIAGNOSIS — R112 Nausea with vomiting, unspecified: Secondary | ICD-10-CM | POA: Diagnosis not present

## 2024-08-20 ENCOUNTER — Other Ambulatory Visit: Payer: Self-pay | Admitting: Family Medicine

## 2024-08-20 DIAGNOSIS — G4709 Other insomnia: Secondary | ICD-10-CM

## 2024-08-21 ENCOUNTER — Other Ambulatory Visit (HOSPITAL_COMMUNITY): Payer: Self-pay

## 2024-08-22 ENCOUNTER — Other Ambulatory Visit (HOSPITAL_COMMUNITY): Payer: Self-pay

## 2024-08-22 MED ORDER — TRAZODONE HCL 50 MG PO TABS
25.0000 mg | ORAL_TABLET | Freq: Every evening | ORAL | 1 refills | Status: AC | PRN
  Filled 2024-08-22: qty 45, 90d supply, fill #0
  Filled 2024-11-22: qty 45, 90d supply, fill #1

## 2024-09-05 ENCOUNTER — Other Ambulatory Visit (HOSPITAL_COMMUNITY): Payer: Self-pay

## 2024-09-05 ENCOUNTER — Other Ambulatory Visit: Payer: Self-pay

## 2024-09-05 ENCOUNTER — Other Ambulatory Visit: Payer: Self-pay | Admitting: Family Medicine

## 2024-09-05 DIAGNOSIS — E039 Hypothyroidism, unspecified: Secondary | ICD-10-CM

## 2024-09-05 MED ORDER — LEVOTHYROXINE SODIUM 125 MCG PO TABS
125.0000 ug | ORAL_TABLET | Freq: Every day | ORAL | 1 refills | Status: AC
  Filled 2024-09-05: qty 90, 90d supply, fill #0
  Filled 2024-12-06: qty 90, 90d supply, fill #1

## 2024-09-12 ENCOUNTER — Ambulatory Visit: Admitting: Family Medicine

## 2024-09-21 ENCOUNTER — Other Ambulatory Visit: Payer: Self-pay | Admitting: Medical Genetics

## 2024-09-21 DIAGNOSIS — Z006 Encounter for examination for normal comparison and control in clinical research program: Secondary | ICD-10-CM

## 2024-09-25 ENCOUNTER — Encounter: Payer: Self-pay | Admitting: Dermatology

## 2024-09-25 ENCOUNTER — Ambulatory Visit: Payer: 59 | Admitting: Dermatology

## 2024-09-25 VITALS — BP 106/71 | HR 80

## 2024-09-25 DIAGNOSIS — L219 Seborrheic dermatitis, unspecified: Secondary | ICD-10-CM

## 2024-09-25 DIAGNOSIS — D1801 Hemangioma of skin and subcutaneous tissue: Secondary | ICD-10-CM | POA: Diagnosis not present

## 2024-09-25 DIAGNOSIS — W908XXA Exposure to other nonionizing radiation, initial encounter: Secondary | ICD-10-CM

## 2024-09-25 DIAGNOSIS — L814 Other melanin hyperpigmentation: Secondary | ICD-10-CM

## 2024-09-25 DIAGNOSIS — Z1283 Encounter for screening for malignant neoplasm of skin: Secondary | ICD-10-CM

## 2024-09-25 DIAGNOSIS — D229 Melanocytic nevi, unspecified: Secondary | ICD-10-CM

## 2024-09-25 DIAGNOSIS — L578 Other skin changes due to chronic exposure to nonionizing radiation: Secondary | ICD-10-CM

## 2024-09-25 DIAGNOSIS — L821 Other seborrheic keratosis: Secondary | ICD-10-CM

## 2024-09-25 NOTE — Progress Notes (Signed)
   Total Body Skin Exam (TBSE) Visit   Subjective  Donna Tucker is a 38 y.o. female who presents for the following: Skin Cancer Screening and Full Body Skin Exam  Patient presents today for follow up visit for TBSE. Patient was last evaluated on 09/26/23 . Patient denies medication changes. Patient reports she does not have spots, moles and lesions of concern to be evaluated. Patient reports throughout her lifetime she has had moderate sun exposure. Currently, patient reports if she has excessive sun exposure, she does apply sunscreen and/or wears protective coverings. Patient reports she has hx of bx (Dermato Fibroma- Center Forehead). Patient denies  family history of skin cancers.   Patient reports she is not actively pregnant trying to conceive or nursing.  The following portions of the chart were reviewed this encounter and updated as appropriate: medications, allergies, medical history  Review of Systems:  No other skin or systemic complaints except as noted in HPI or Assessment and Plan.  Objective  Well appearing patient in no apparent distress; mood and affect are within normal limits.  A full examination was performed including scalp, head, eyes, ears, nose, lips, neck, chest, axillae, abdomen, back, buttocks, bilateral upper extremities, bilateral lower extremities, hands, feet, fingers, toes, fingernails, and toenails. All findings within normal limits unless otherwise noted below.   Relevant physical exam findings are noted in the Assessment and Plan.    Assessment & Plan   LENTIGINES & HEMANGIOMAS - Benign normal skin lesions - Benign-appearing - Call for any changes  MELANOCYTIC NEVI - Tan-brown and/or pink-flesh-colored symmetric macules and papules - Benign appearing on exam today - Observation - Call clinic for new or changing moles - Recommend daily use of broad spectrum spf 30+ sunscreen to sun-exposed areas.   MILD ACTINIC DAMAGE - Chronic condition,  secondary to cumulative UV/sun exposure - diffuse scaly erythematous macules with underlying dyspigmentation - Recommend daily broad spectrum sunscreen SPF 30+ to sun-exposed areas, reapply every 2 hours as needed.  - Staying in the shade or wearing long sleeves, sun glasses (UVA+UVB protection) and wide brim hats (4-inch brim around the entire circumference of the hat) are also recommended for sun protection.  - Call for new or changing lesions.  SEBORRHEIC DERMATITIS Exam: Pink patches with greasy scale at posterior scalp  Flared  Seborrheic Dermatitis is a chronic persistent rash characterized by pinkness and scaling most commonly of the mid face but also can occur on the scalp (dandruff), ears; mid chest, mid back and groin.  It tends to be exacerbated by stress and cooler weather.  People who have neurologic disease may experience new onset or exacerbation of existing seborrheic dermatitis.  The condition is not curable but treatable and can be controlled.  Treatment Plan: - Recommended washing with DHS Zinc Shampoo allowing it to sit on the scalp for about 2-3 minutes prior to washing out with regular shampoo  SKIN CANCER SCREENING PERFORMED TODAY.    Return in about 1 year (around 09/25/2025) for TBSE.  I, Jetta Ager, am acting as Neurosurgeon for [employer], DO.  Documentation: I have reviewed the above documentation for accuracy and completeness, and I agree with the above.  Delon Lenis, DO

## 2024-09-25 NOTE — Patient Instructions (Signed)

## 2024-10-16 ENCOUNTER — Other Ambulatory Visit: Payer: Self-pay | Admitting: Family Medicine

## 2024-10-16 DIAGNOSIS — F419 Anxiety disorder, unspecified: Secondary | ICD-10-CM

## 2024-10-16 DIAGNOSIS — I10 Essential (primary) hypertension: Secondary | ICD-10-CM

## 2024-10-17 ENCOUNTER — Other Ambulatory Visit (HOSPITAL_COMMUNITY): Payer: Self-pay

## 2024-10-17 ENCOUNTER — Other Ambulatory Visit: Payer: Self-pay

## 2024-10-17 MED ORDER — HYDROCHLOROTHIAZIDE 25 MG PO TABS
25.0000 mg | ORAL_TABLET | Freq: Every day | ORAL | 0 refills | Status: DC
  Filled 2024-10-17: qty 90, 90d supply, fill #0

## 2024-10-17 MED ORDER — SERTRALINE HCL 25 MG PO TABS
25.0000 mg | ORAL_TABLET | Freq: Every day | ORAL | 1 refills | Status: AC
  Filled 2024-10-17: qty 90, 90d supply, fill #0
  Filled 2025-01-10: qty 90, 90d supply, fill #1

## 2024-12-07 ENCOUNTER — Other Ambulatory Visit: Payer: Self-pay

## 2024-12-18 ENCOUNTER — Encounter: Payer: Self-pay | Admitting: Family Medicine

## 2024-12-18 ENCOUNTER — Ambulatory Visit: Admitting: Family Medicine

## 2024-12-18 VITALS — BP 109/76 | HR 76 | Ht 64.0 in | Wt 216.0 lb

## 2024-12-18 DIAGNOSIS — F419 Anxiety disorder, unspecified: Secondary | ICD-10-CM | POA: Diagnosis not present

## 2024-12-18 DIAGNOSIS — E782 Mixed hyperlipidemia: Secondary | ICD-10-CM

## 2024-12-18 NOTE — Progress Notes (Signed)
 "  Established Patient Office Visit  Subjective   Patient ID: Donna Tucker, female    DOB: August 04, 1986  Age: 39 y.o. MRN: [id number]  Chief Complaint  Patient presents with   Medical Management of Chronic Issues    High cholesterol previously - would like to check levels Anxiety - does take medication but is wondering if it is time to change it up Multivitamin?      HPI  Pt here for 6 month follow up. Anxiety Taking Sertraline  25mg  daily. Pt still having anxiety moments every 4-6 weeks.  Lasts less than 12 hours and not debilitating.  Still taking Trazodone  1/2 tab 50mg  at night but this helps.     12/18/2024    2:25 PM 01/12/2023   11:22 AM 06/01/2021    3:44 PM 07/24/2020    9:18 AM  GAD 7 : Generalized Anxiety Score  Nervous, Anxious, on Edge 1 1 1 1   Control/stop worrying 0 1 1 0  Worry too much - different things 1 2 0 1  Trouble relaxing 0 3 0 1  Restless 0 2 1 0  Easily annoyed or irritable 1 1 0 0  Afraid - awful might happen 0 0 0 1  Total GAD 7 Score 3 10 3 4   Anxiety Difficulty Somewhat difficult Somewhat difficult Not difficult at all Not difficult at all    She already received flu vaccine with work.    Review of Systems  Psychiatric/Behavioral:  The patient is nervous/anxious.   All other systems reviewed and are negative.     Objective:     BP 109/76 (BP Location: Left Arm, Patient Position: Sitting, Cuff Size: Normal)   Pulse 76   Ht 5' 4 (1.626 m)   Wt 216 lb (98 kg)   SpO2 98%   BMI 37.08 kg/m  BP Readings from Last 3 Encounters:  12/18/24 109/76  09/25/24 106/71  03/13/24 110/76      Physical Exam Vitals and nursing note reviewed.  Constitutional:      Appearance: Normal appearance. She is obese.  HENT:     Head: Normocephalic and atraumatic.     Right Ear: External ear normal.     Left Ear: External ear normal.     Nose: Nose normal.     Mouth/Throat:     Mouth: Mucous membranes are moist.     Pharynx: Oropharynx is clear.   Eyes:     Conjunctiva/sclera: Conjunctivae normal.     Pupils: Pupils are equal, round, and reactive to light.  Cardiovascular:     Rate and Rhythm: Normal rate.  Pulmonary:     Effort: Pulmonary effort is normal.  Abdominal:     General: Abdomen is flat. Bowel sounds are normal.  Skin:    General: Skin is warm.     Capillary Refill: Capillary refill takes less than 2 seconds.  Neurological:     General: No focal deficit present.     Mental Status: She is alert and oriented to person, place, and time. Mental status is at baseline.  Psychiatric:        Mood and Affect: Mood normal.        Behavior: Behavior normal.        Thought Content: Thought content normal.        Judgment: Judgment normal.    No results found for any visits on 12/18/24.    The ASCVD Risk score (Arnett DK, et al., 2019) failed to calculate for the  following reasons:   The 2019 ASCVD risk score is only valid for ages 34 to 46    Assessment & Plan:   Problem List Items Addressed This Visit       Other   Anxiety - Primary   Other Visit Diagnoses       Mixed hyperlipidemia          Anxiety  Mixed hyperlipidemia   Pt would like to monitor anxiety and stay on current dose of Sertraline  25mg .  Recheck lipid with physical. No follow-ups on file.    Torrence CINDERELLA Barrier, MD  "

## 2025-01-10 ENCOUNTER — Other Ambulatory Visit: Payer: Self-pay

## 2025-01-10 ENCOUNTER — Other Ambulatory Visit (HOSPITAL_COMMUNITY): Payer: Self-pay

## 2025-01-10 ENCOUNTER — Other Ambulatory Visit: Payer: Self-pay | Admitting: Family Medicine

## 2025-01-10 DIAGNOSIS — I10 Essential (primary) hypertension: Secondary | ICD-10-CM

## 2025-01-10 MED ORDER — HYDROCHLOROTHIAZIDE 25 MG PO TABS
25.0000 mg | ORAL_TABLET | Freq: Every day | ORAL | 1 refills | Status: AC
  Filled 2025-01-10: qty 90, 90d supply, fill #0

## 2025-03-18 ENCOUNTER — Encounter: Admitting: Family Medicine

## 2025-09-30 ENCOUNTER — Ambulatory Visit: Admitting: Dermatology
